# Patient Record
Sex: Female | Born: 1935 | Race: White | Hispanic: No | Smoking: Former smoker
Health system: Southern US, Community
[De-identification: ages and names within clinical notes are randomized; demographics above are authoritative.]

## PROBLEM LIST (undated history)

## (undated) DIAGNOSIS — F32A Depression, unspecified: Secondary | ICD-10-CM

## (undated) DIAGNOSIS — M8580 Other specified disorders of bone density and structure, unspecified site: Secondary | ICD-10-CM

## (undated) DIAGNOSIS — I639 Cerebral infarction, unspecified: Secondary | ICD-10-CM

## (undated) DIAGNOSIS — I10 Essential (primary) hypertension: Secondary | ICD-10-CM

## (undated) DIAGNOSIS — E785 Hyperlipidemia, unspecified: Secondary | ICD-10-CM

## (undated) DIAGNOSIS — I4901 Ventricular fibrillation: Secondary | ICD-10-CM

## (undated) DIAGNOSIS — M4800 Spinal stenosis, site unspecified: Secondary | ICD-10-CM

## (undated) DIAGNOSIS — I255 Ischemic cardiomyopathy: Secondary | ICD-10-CM

## (undated) DIAGNOSIS — R4701 Aphasia: Secondary | ICD-10-CM

## (undated) DIAGNOSIS — G629 Polyneuropathy, unspecified: Secondary | ICD-10-CM

## (undated) DIAGNOSIS — E7800 Pure hypercholesterolemia, unspecified: Secondary | ICD-10-CM

## (undated) DIAGNOSIS — Z95810 Presence of automatic (implantable) cardiac defibrillator: Secondary | ICD-10-CM

## (undated) DIAGNOSIS — L0390 Cellulitis, unspecified: Secondary | ICD-10-CM

## (undated) DIAGNOSIS — F329 Major depressive disorder, single episode, unspecified: Secondary | ICD-10-CM

## (undated) DIAGNOSIS — E559 Vitamin D deficiency, unspecified: Secondary | ICD-10-CM

## (undated) DIAGNOSIS — I219 Acute myocardial infarction, unspecified: Secondary | ICD-10-CM

## (undated) DIAGNOSIS — R0600 Dyspnea, unspecified: Secondary | ICD-10-CM

## (undated) DIAGNOSIS — I509 Heart failure, unspecified: Secondary | ICD-10-CM

## (undated) DIAGNOSIS — N289 Disorder of kidney and ureter, unspecified: Secondary | ICD-10-CM

## (undated) DIAGNOSIS — J449 Chronic obstructive pulmonary disease, unspecified: Secondary | ICD-10-CM

## (undated) DIAGNOSIS — E039 Hypothyroidism, unspecified: Secondary | ICD-10-CM

## (undated) DIAGNOSIS — R0609 Other forms of dyspnea: Secondary | ICD-10-CM

## (undated) DIAGNOSIS — I2510 Atherosclerotic heart disease of native coronary artery without angina pectoris: Secondary | ICD-10-CM

## (undated) DIAGNOSIS — E538 Deficiency of other specified B group vitamins: Secondary | ICD-10-CM

## (undated) HISTORY — DX: Depression, unspecified: F32.A

## (undated) HISTORY — DX: Hyperlipidemia, unspecified: E78.5

## (undated) HISTORY — DX: Hypothyroidism, unspecified: E03.9

## (undated) HISTORY — PX: CARDIAC DEFIBRILLATOR PLACEMENT: SHX171

## (undated) HISTORY — PX: JOINT REPLACEMENT: SHX530

## (undated) HISTORY — DX: Spinal stenosis, site unspecified: M48.00

## (undated) HISTORY — DX: Chronic obstructive pulmonary disease, unspecified: J44.9

## (undated) HISTORY — PX: REPLACEMENT TOTAL KNEE: SUR1224

## (undated) HISTORY — DX: Other specified disorders of bone density and structure, unspecified site: M85.80

## (undated) HISTORY — DX: Ischemic cardiomyopathy: I25.5

## (undated) HISTORY — DX: Major depressive disorder, single episode, unspecified: F32.9

## (undated) HISTORY — DX: Essential (primary) hypertension: I10

## (undated) HISTORY — DX: Deficiency of other specified B group vitamins: E53.8

## (undated) HISTORY — DX: Atherosclerotic heart disease of native coronary artery without angina pectoris: I25.10

## (undated) HISTORY — DX: Presence of automatic (implantable) cardiac defibrillator: Z95.810

---
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
## 1969-07-15 HISTORY — PX: ABDOMINAL HYSTERECTOMY: SHX81

## 2004-11-05 ENCOUNTER — Inpatient Hospital Stay (HOSPITAL_COMMUNITY): Admission: RE | Admit: 2004-11-05 | Discharge: 2004-11-08 | Payer: Self-pay | Admitting: Orthopedic Surgery

## 2004-11-13 ENCOUNTER — Ambulatory Visit (HOSPITAL_COMMUNITY): Admission: RE | Admit: 2004-11-13 | Discharge: 2004-11-13 | Payer: Self-pay | Admitting: Orthopedic Surgery

## 2004-11-27 ENCOUNTER — Emergency Department (HOSPITAL_COMMUNITY): Admission: EM | Admit: 2004-11-27 | Discharge: 2004-11-27 | Payer: Self-pay | Admitting: Emergency Medicine

## 2004-11-27 ENCOUNTER — Inpatient Hospital Stay (HOSPITAL_COMMUNITY): Admission: AD | Admit: 2004-11-27 | Discharge: 2004-12-03 | Payer: Self-pay | Admitting: Cardiology

## 2004-11-27 ENCOUNTER — Ambulatory Visit: Payer: Self-pay | Admitting: Critical Care Medicine

## 2004-11-29 ENCOUNTER — Encounter: Payer: Self-pay | Admitting: Cardiology

## 2004-11-29 ENCOUNTER — Ambulatory Visit: Payer: Self-pay | Admitting: Cardiology

## 2004-12-12 ENCOUNTER — Encounter: Payer: Self-pay | Admitting: Cardiology

## 2004-12-19 ENCOUNTER — Ambulatory Visit: Payer: Self-pay | Admitting: Cardiology

## 2004-12-24 ENCOUNTER — Ambulatory Visit: Payer: Self-pay | Admitting: Internal Medicine

## 2005-01-10 ENCOUNTER — Ambulatory Visit: Payer: Self-pay | Admitting: Cardiology

## 2005-01-18 ENCOUNTER — Ambulatory Visit (HOSPITAL_COMMUNITY): Admission: RE | Admit: 2005-01-18 | Discharge: 2005-01-19 | Payer: Self-pay | Admitting: Cardiology

## 2005-01-19 ENCOUNTER — Ambulatory Visit: Payer: Self-pay | Admitting: Cardiology

## 2005-02-06 ENCOUNTER — Ambulatory Visit: Payer: Self-pay | Admitting: Cardiology

## 2005-02-12 ENCOUNTER — Ambulatory Visit: Payer: Self-pay | Admitting: Internal Medicine

## 2005-02-26 ENCOUNTER — Ambulatory Visit: Payer: Self-pay | Admitting: Internal Medicine

## 2005-02-26 ENCOUNTER — Inpatient Hospital Stay (HOSPITAL_COMMUNITY): Admission: RE | Admit: 2005-02-26 | Discharge: 2005-02-27 | Payer: Self-pay | Admitting: Internal Medicine

## 2005-03-11 ENCOUNTER — Ambulatory Visit: Payer: Self-pay

## 2005-03-13 ENCOUNTER — Ambulatory Visit: Payer: Self-pay | Admitting: Cardiology

## 2005-06-18 ENCOUNTER — Ambulatory Visit: Payer: Self-pay | Admitting: Cardiology

## 2005-06-21 ENCOUNTER — Ambulatory Visit: Payer: Self-pay | Admitting: Cardiology

## 2005-07-02 ENCOUNTER — Ambulatory Visit: Payer: Self-pay | Admitting: Internal Medicine

## 2005-10-01 ENCOUNTER — Ambulatory Visit: Payer: Self-pay | Admitting: Internal Medicine

## 2005-11-03 IMAGING — CR DG CHEST 1V PORT
1 series · 1 of 1 positions shown · non-contrast
Comparison: 10/30/04.

CLINICAL DATA: Cardiac arrest. Chest pain.
 PORTABLE B7213-D VIEW:

[view not recorded]
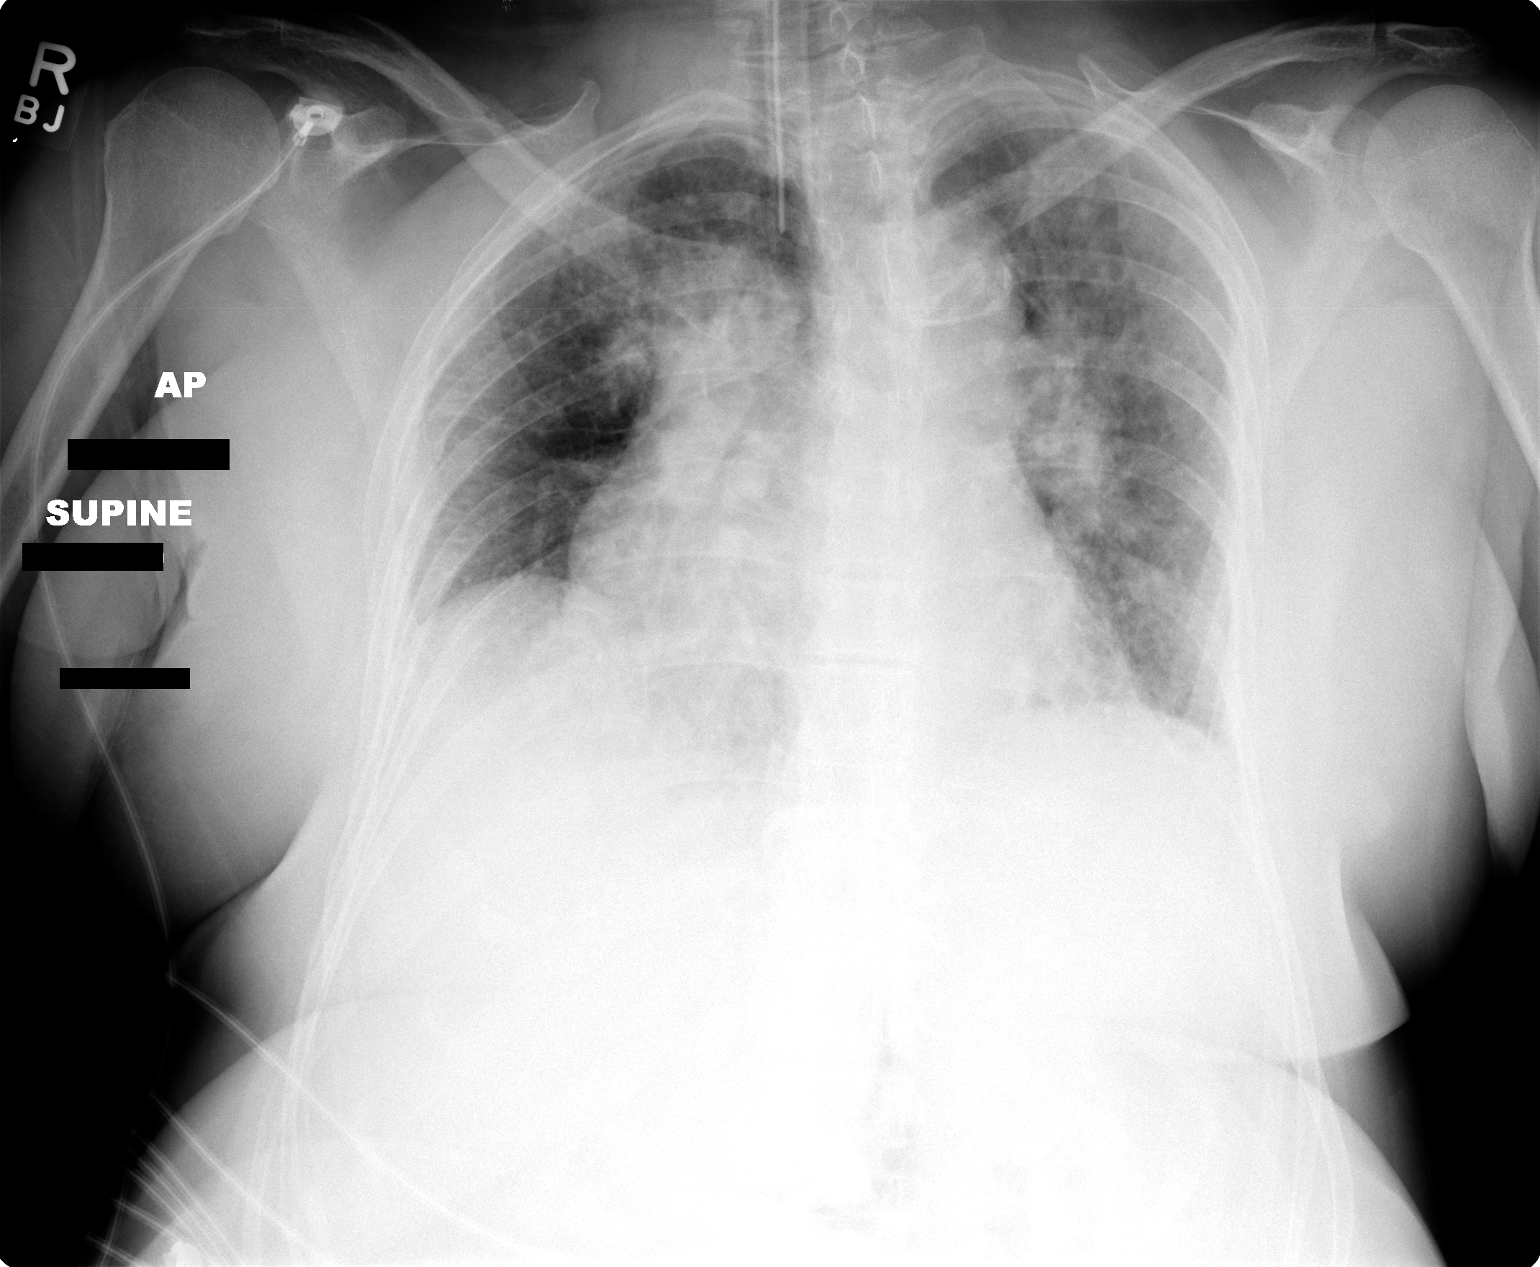

[1 of 1 positions shown; findings below may reference images not displayed]

Endotracheal tube appears in good position at the thoracic inlet.  There is poor aeration bilaterally with some mild bilateral edema.  There is extensive calcification in the thoracic aorta.
IMPRESSION: Mild pulmonary edema.  Endotracheal tube appears in good position.

## 2005-11-04 IMAGING — CR DG CHEST 1V PORT
1 series · 1 of 1 positions shown · non-contrast
Comparison: Yesterday?s exam.

CLINICAL DATA: MI.  Follow-up ventilator patient.
 PORTABLE CHEST ? 1 VIEW:

[view not recorded]
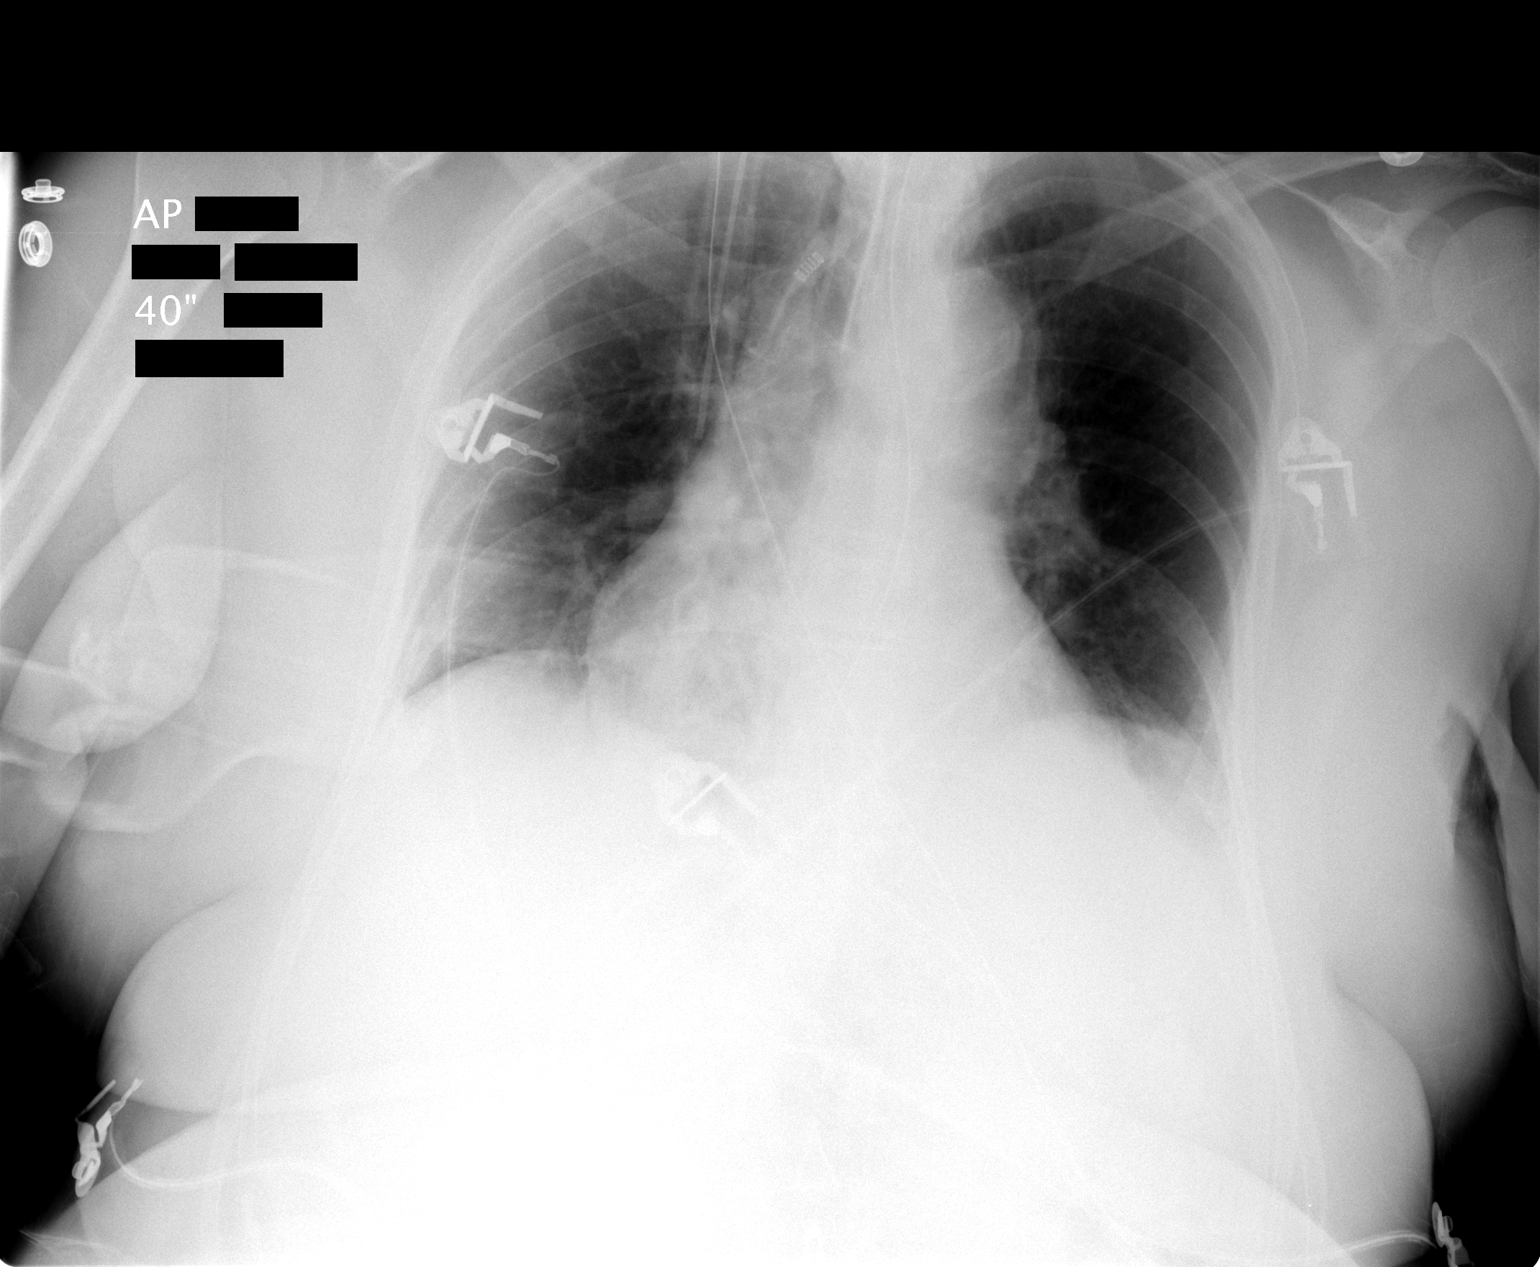

[1 of 1 positions shown; findings below may reference images not displayed]

ETT is in the lower trachea approximately 2 cm above the carina.  NG tube tip is in the mid stomach.  Lungs are well expanded and clear except for minimal atelectasis at the bases.  No vascular congestion.
IMPRESSION: Stable chest x-ray.  See comments above.

## 2006-01-20 ENCOUNTER — Ambulatory Visit: Payer: Self-pay

## 2006-04-17 ENCOUNTER — Ambulatory Visit: Payer: Self-pay | Admitting: Internal Medicine

## 2006-05-16 ENCOUNTER — Ambulatory Visit: Payer: Self-pay

## 2006-07-22 ENCOUNTER — Ambulatory Visit: Payer: Self-pay | Admitting: Internal Medicine

## 2006-08-04 ENCOUNTER — Encounter: Payer: Self-pay | Admitting: Cardiology

## 2006-10-21 ENCOUNTER — Ambulatory Visit: Payer: Self-pay | Admitting: Internal Medicine

## 2007-01-20 ENCOUNTER — Ambulatory Visit: Payer: Self-pay | Admitting: Internal Medicine

## 2007-04-21 ENCOUNTER — Ambulatory Visit: Payer: Self-pay | Admitting: Internal Medicine

## 2007-04-23 ENCOUNTER — Ambulatory Visit: Payer: Self-pay | Admitting: Internal Medicine

## 2007-07-21 ENCOUNTER — Ambulatory Visit: Payer: Self-pay | Admitting: Internal Medicine

## 2007-10-20 ENCOUNTER — Ambulatory Visit: Payer: Self-pay | Admitting: Internal Medicine

## 2007-12-18 ENCOUNTER — Ambulatory Visit: Payer: Self-pay | Admitting: Cardiology

## 2007-12-22 ENCOUNTER — Encounter: Payer: Self-pay | Admitting: Physician Assistant

## 2007-12-25 ENCOUNTER — Ambulatory Visit: Payer: Self-pay | Admitting: Cardiology

## 2008-01-08 ENCOUNTER — Ambulatory Visit: Payer: Self-pay | Admitting: Cardiology

## 2008-01-13 ENCOUNTER — Ambulatory Visit: Payer: Self-pay | Admitting: Internal Medicine

## 2008-04-11 ENCOUNTER — Ambulatory Visit: Payer: Self-pay | Admitting: Internal Medicine

## 2008-07-12 ENCOUNTER — Ambulatory Visit: Payer: Self-pay | Admitting: Internal Medicine

## 2008-07-21 ENCOUNTER — Ambulatory Visit: Payer: Self-pay | Admitting: Cardiology

## 2008-07-22 ENCOUNTER — Encounter: Payer: Self-pay | Admitting: Cardiology

## 2008-09-23 ENCOUNTER — Encounter: Payer: Self-pay | Admitting: Internal Medicine

## 2008-10-11 ENCOUNTER — Ambulatory Visit: Payer: Self-pay | Admitting: Internal Medicine

## 2008-11-30 ENCOUNTER — Encounter: Payer: Self-pay | Admitting: Cardiology

## 2009-02-01 DIAGNOSIS — I2589 Other forms of chronic ischemic heart disease: Secondary | ICD-10-CM | POA: Insufficient documentation

## 2009-02-01 DIAGNOSIS — Z95810 Presence of automatic (implantable) cardiac defibrillator: Secondary | ICD-10-CM | POA: Insufficient documentation

## 2009-02-01 DIAGNOSIS — I10 Essential (primary) hypertension: Secondary | ICD-10-CM | POA: Insufficient documentation

## 2009-02-01 DIAGNOSIS — I5022 Chronic systolic (congestive) heart failure: Secondary | ICD-10-CM | POA: Insufficient documentation

## 2009-02-27 ENCOUNTER — Ambulatory Visit: Payer: Self-pay | Admitting: Cardiology

## 2009-02-27 ENCOUNTER — Encounter: Payer: Self-pay | Admitting: Cardiology

## 2009-02-27 DIAGNOSIS — J449 Chronic obstructive pulmonary disease, unspecified: Secondary | ICD-10-CM | POA: Insufficient documentation

## 2009-02-27 DIAGNOSIS — I25810 Atherosclerosis of coronary artery bypass graft(s) without angina pectoris: Secondary | ICD-10-CM | POA: Insufficient documentation

## 2009-02-27 DIAGNOSIS — J4489 Other specified chronic obstructive pulmonary disease: Secondary | ICD-10-CM | POA: Insufficient documentation

## 2009-02-27 DIAGNOSIS — M4800 Spinal stenosis, site unspecified: Secondary | ICD-10-CM | POA: Insufficient documentation

## 2009-02-27 DIAGNOSIS — I868 Varicose veins of other specified sites: Secondary | ICD-10-CM | POA: Insufficient documentation

## 2009-02-27 DIAGNOSIS — I4901 Ventricular fibrillation: Secondary | ICD-10-CM | POA: Insufficient documentation

## 2009-03-15 ENCOUNTER — Ambulatory Visit: Payer: Self-pay | Admitting: Internal Medicine

## 2009-06-29 ENCOUNTER — Encounter: Payer: Self-pay | Admitting: Internal Medicine

## 2009-07-05 ENCOUNTER — Telehealth: Payer: Self-pay | Admitting: Internal Medicine

## 2009-07-05 ENCOUNTER — Encounter: Payer: Self-pay | Admitting: Internal Medicine

## 2009-07-05 ENCOUNTER — Ambulatory Visit: Payer: Self-pay | Admitting: Internal Medicine

## 2009-07-18 ENCOUNTER — Encounter: Payer: Self-pay | Admitting: Internal Medicine

## 2009-08-21 ENCOUNTER — Encounter: Payer: Self-pay | Admitting: Cardiology

## 2009-09-19 ENCOUNTER — Ambulatory Visit: Payer: Self-pay | Admitting: Cardiology

## 2009-10-03 ENCOUNTER — Ambulatory Visit: Payer: Self-pay | Admitting: Internal Medicine

## 2009-10-16 ENCOUNTER — Encounter: Payer: Self-pay | Admitting: Cardiology

## 2009-10-18 ENCOUNTER — Encounter: Payer: Self-pay | Admitting: Internal Medicine

## 2009-10-26 ENCOUNTER — Encounter: Payer: Self-pay | Admitting: Cardiology

## 2009-11-07 ENCOUNTER — Inpatient Hospital Stay (HOSPITAL_COMMUNITY): Admission: RE | Admit: 2009-11-07 | Discharge: 2009-11-09 | Payer: Self-pay | Admitting: Orthopedic Surgery

## 2009-11-13 ENCOUNTER — Encounter (INDEPENDENT_AMBULATORY_CARE_PROVIDER_SITE_OTHER): Payer: Self-pay | Admitting: Orthopedic Surgery

## 2009-11-13 ENCOUNTER — Ambulatory Visit: Payer: Self-pay | Admitting: Vascular Surgery

## 2009-11-13 ENCOUNTER — Ambulatory Visit: Admission: RE | Admit: 2009-11-13 | Discharge: 2009-11-13 | Payer: Self-pay | Admitting: Orthopedic Surgery

## 2009-12-05 ENCOUNTER — Encounter (HOSPITAL_COMMUNITY): Admission: RE | Admit: 2009-12-05 | Discharge: 2010-01-04 | Payer: Self-pay | Admitting: Orthopedic Surgery

## 2010-01-09 ENCOUNTER — Ambulatory Visit: Payer: Self-pay | Admitting: Internal Medicine

## 2010-01-23 ENCOUNTER — Encounter: Payer: Self-pay | Admitting: Internal Medicine

## 2010-01-24 ENCOUNTER — Encounter: Payer: Self-pay | Admitting: Cardiology

## 2010-01-29 ENCOUNTER — Encounter: Payer: Self-pay | Admitting: Cardiology

## 2010-03-21 ENCOUNTER — Ambulatory Visit: Payer: Self-pay | Admitting: Internal Medicine

## 2010-04-12 ENCOUNTER — Ambulatory Visit: Payer: Self-pay | Admitting: Cardiology

## 2010-06-21 ENCOUNTER — Ambulatory Visit: Payer: Self-pay | Admitting: Internal Medicine

## 2010-06-27 ENCOUNTER — Encounter: Payer: Self-pay | Admitting: Internal Medicine

## 2010-07-02 ENCOUNTER — Encounter: Payer: Self-pay | Admitting: Internal Medicine

## 2010-07-24 ENCOUNTER — Encounter (INDEPENDENT_AMBULATORY_CARE_PROVIDER_SITE_OTHER): Payer: Self-pay | Admitting: *Deleted

## 2010-08-14 NOTE — Letter (Signed)
Summary: Remote Device Check  [facility], Main Office  [address]   [place], [place] [address]   Phone: [phone]  Fax: [phone]     October 18, 2009 MRN: [id number]   [name] [address] [place], [place]  [address]   Dear [name],   Your remote transmission was recieved and reviewed by your physician.  All diagnostics were within normal limits for you.  ___X__Your next transmission is scheduled for:    January 09, 2010.  Please transmit at any time this day.  If you have a wireless device your transmission will be sent automatically.     Sincerely,  Proofreader

## 2010-08-14 NOTE — Assessment & Plan Note (Signed)
Summary: PC2  Medications Added LEVOTHROID 88 MCG TABS (LEVOTHYROXINE SODIUM) take 1 tab daily SERTRALINE HCL 50 MG TABS (SERTRALINE HCL) take 1 tab daily      Allergies Added:   Visit Type:  Follow-up Primary Provider:  Dr. Reuel Boom  CC:  no cardiology complaints.  History of Present Illness: [name] returns today for followup.  She is a pleasant 77 yo woman with a DCM, s/p VF arrest, HTN, and arthritis.  She recently had knee replacement and has also had problems with HA.  No recurrent syncope, c/p,sob or ICD shocks.  Current Medications (verified): 1)  Levothroid 88 Mcg Tabs (Levothyroxine Sodium) .... Take 1 Tab Daily 2)  Digoxin 0.125 Mg Tabs (Digoxin) .... Take One Tablet By Mouth Daily 3)  Inspra 25 Mg Tabs (Eplerenone) .... Once Daily 4)  Aspirin 81 Mg Tbec (Aspirin) .... Take One Tablet By Mouth Daily 5)  Sertraline Hcl 50 Mg Tabs (Sertraline Hcl) .... Take 1 Tab Daily 6)  Metoprolol Succinate 100 Mg Xr24h-Tab (Metoprolol Succinate) .... Take One Tablet By Mouth Daily 7)  Lisinopril 40 Mg Tabs (Lisinopril) .... Take One Tablet By Mouth Daily 8)  Calcium Carbonate-Vitamin D 600-400 Mg-Unit  Tabs (Calcium Carbonate-Vitamin D) .... Two Times A Day 9)  Fish Oil   Oil (Fish Oil) .... 1000mg  At Bedtime 10)  Clonidine Hcl 0.1 Mg Tabs (Clonidine Hcl) .... Take One Tablet By Mouth Once A Day 11)  Plavix 75 Mg Tabs (Clopidogrel Bisulfate) .... Take One Tablet By Mouth Daily 12)  Lipitor 40 Mg Tabs (Atorvastatin Calcium) .... Take One Tablet By Mouth Daily. 13)  Premarin 0.45 Mg Tabs (Estrogens Conjugated) .... At Bedtime 14)  Chlorthalidone 25 Mg Tabs (Chlorthalidone) .... Take 1 Tablet By Mouth Once A Day  Allergies (verified): 1)  ! Betadine 2)  ! Cardura 3)  ! Oxycodone Hcl 4)  ! Neomycin 5)  ! * Adhesive Tape 6)  ! Nsaids 7)  ! Prednisone 8)  ! Cephalexin  Past History:  Past Medical History: Last updated: 02/01/2009 HYPERTENSION, UNSPECIFIED  (ICD-401.9) SYSTOLIC HEART FAILURE, CHRONIC (ICD-428.22) ICD - IN SITU (ICD-V45.02) CARDIOMYOPATHY, ISCHEMIC (ICD-414.8) COPD  Past Surgical History: Last updated: 02/01/2009 Abdominal Hysterectomy-Total - '71 Appendectomy - '55 Tonsillectomy - '53 D&C - '65  Review of Systems  The patient denies chest pain, syncope, dyspnea on exertion, and peripheral edema.    Vital Signs:  Patient profile:   77 year old female Weight:      142 pounds BMI:     26.07 Pulse rate:   63 / minute BP sitting:   134 / 75  (right arm)  Vitals Entered By: Dreama Saa, CNA (March 21, 2010 3:20 PM)  Physical Exam  General:  Well developed, well nourished, in no acute distress. Head:  normocephalic and atraumatic Eyes:  PERRLA/EOM intact; conjunctiva and lids normal. Mouth:  Teeth, gums and palate normal. Oral mucosa normal. Neck:  Neck supple, no JVD. No masses, thyromegaly or abnormal cervical nodes. Chest Wall:  no deformities or breast masses noted.  Status post ICD implant well seated Lungs:  Clear bilaterally to auscultation with no wheezes, rales or rhonchi. Heart:  RRR with normal S1 and S2.  PMI is enlarged and laterally displaced.  No murmurs, rubs, or gallops. Abdomen:  Bowel sounds positive; abdomen soft and non-tender without masses, organomegaly, or hernias noted. No hepatosplenomegaly. Msk:  Back normal, normal gait. Muscle strength and tone normal. Pulses:  pulses normal in all 4 extremities Extremities:  No clubbing or cyanosis. Neurologic:  Alert and oriented x 3.    ICD Specifications Following MD:  Lewayne Bunting, MD     ICD Vendor:  Medtronic     ICD Model Number:  7232     ICD Serial Number:  [id number] ICD DOI:  02/26/2005     ICD Implanting MD:  Lewayne Bunting, MD  Lead 1:    Location: RV     DOI: 02/26/2005     Model #: [id number]     Serial #: [id number]     Status: active  Indications::  ICM   ICD Follow Up Remote Check?  No Battery Voltage:  3.04 V     Charge  Time:  8.95 seconds     Underlying rhythm:  SR ICD Dependent:  No       ICD Device Measurements Right Ventricle:  Amplitude: 10.3 mV, Impedance: 464 ohms, Threshold: 1.0 V at 0.2 msec Shock Impedance: 51/64 ohms   Episodes Coumadin:  No Shock:  0     ATP:  0     Nonsustained:  0     Ventricular Pacing:  <0.1%  Brady Parameters Mode VVI     Lower Rate Limit:  40      Tachy Zones VF:  200     VT:  250     VT1:  176     Next Remote Date:  06/21/2010     Next Cardiology Appt Due:  03/16/2011 Tech Comments:  No parameter changes.  Device function normal.  6949 lead stable, SIC  0.  Updated letter and magnet given to the patient.  [facility] transmissions every 3 months.  ROV 1 year with Dr. Ladona Ridgel in [place]. Altha Harm, LPN  March 21, 2010 3:33 PM  MD Comments:  Agree with above.  Impression & Recommendations:  Problem # 1:  VENTRICULAR FIBRILLATION (ICD-427.41) she has had no recurrent symptoms or ICD shocks or syncope.  Will continue meds as noted below. Her updated medication list for this problem includes:    Aspirin 81 Mg Tbec (Aspirin) .Marland Kitchen... Take one tablet by mouth daily    Metoprolol Succinate 100 Mg Xr24h-tab (Metoprolol succinate) .Marland Kitchen... Take one tablet by mouth daily    Lisinopril 40 Mg Tabs (Lisinopril) .Marland Kitchen... Take one tablet by mouth daily    Plavix 75 Mg Tabs (Clopidogrel bisulfate) .Marland Kitchen... Take one tablet by mouth daily  Problem # 2:  HYPERTENSION, UNSPECIFIED (ICD-401.9) Her blood pressure is well controlled.  Continue meds as below. I have recommended a low sodium diet. Her updated medication list for this problem includes:    Inspra 25 Mg Tabs (Eplerenone) ..... Once daily    Aspirin 81 Mg Tbec (Aspirin) .Marland Kitchen... Take one tablet by mouth daily    Metoprolol Succinate 100 Mg Xr24h-tab (Metoprolol succinate) .Marland Kitchen... Take one tablet by mouth daily    Lisinopril 40 Mg Tabs (Lisinopril) .Marland Kitchen... Take one tablet by mouth daily    Clonidine Hcl 0.1 Mg Tabs (Clonidine hcl) .Marland Kitchen... Take  one tablet by mouth once a day    Chlorthalidone 25 Mg Tabs (Chlorthalidone) .Marland Kitchen... Take 1 tablet by mouth once a day  Patient Instructions: 1)  Your physician recommends that you schedule a follow-up appointment in: 1 year

## 2010-08-14 NOTE — Letter (Signed)
Summary: Remote Device Check  [facility], Main Office  [address]   [place], [place] [address]   Phone: [phone]  Fax: [phone]     July 18, 2009 MRN: [id number]   TANAI BOULER [address] [place], [place]  [address]   Dear Ms. Leblond,   Your remote transmission was recieved and reviewed by your physician.  All diagnostics were within normal limits for you.  __X___Your next transmission is scheduled for:   October 03, 2009.  Please transmit at any time this day.  If you have a wireless device your transmission will be sent automatically.     Sincerely,  Proofreader

## 2010-08-14 NOTE — Cardiovascular Report (Signed)
Summary: Office Visit Remote   Office Visit Remote   Imported By: Roderic Ovens 10/20/2009 14:52:23  _____________________________________________________________________  External Attachment:    Type:   Image     Comment:   External Document

## 2010-08-14 NOTE — Assessment & Plan Note (Signed)
Summary: 6 MO FU PER SEPT REMINDER-SRS      Allergies Added:   Visit Type:  Follow-up Primary [place]:  Dr. Reuel Boom   History of Present Illness: the patient is a 46 her old female with history of ischemic cardiomyopathy, status post anterior wall myocardial infarction. The patient status post PCI. Ejection fraction was reported at 46%. The patient has a history of sudden cardiac death secondary to ventricular fibrillation and is status post Med[employer] implantable ICD. The patient however has a revised lead is carrying a magnet with her.. Reportedly she had a recent fall and has postconcussive headaches. His father Dr. Reuel Boom. She also had total knee replacement earlier this year and has done well. The patient is compliant with her dual antiplatelet therapy. She denies any chest pain shortness of breath orthopnea or PND. Unfortunately she continues to smoke.  Preventive Screening-Counseling & Management  Alcohol-Tobacco     Smoking Status: current     Smoking Cessation Counseling: yes     Packs/Day: 1/2 PPD  Problems Prior to Update: 1)  Varicose Vein  (ICD-456.8) 2)  Cad, Artery Bypass Graft  (ICD-414.04) 3)  Ventricular Fibrillation  (ICD-427.41) 4)  COPD  (ICD-496) 5)  Spinal Stenosis  (ICD-724.00) 6)  Hypertension, Unspecified  (ICD-401.9) 7)  Systolic Heart Failure, Chronic  (ICD-428.22) 8)  Icd - in Situ  (ICD-V45.02) 9)  Cardiomyopathy, Ischemic  (ICD-414.8)  Medications Prior to Update: 1)  Levothroid 88 Mcg Tabs (Levothyroxine Sodium) .... Take 1 Tab Daily 2)  Digoxin 0.125 Mg Tabs (Digoxin) .... Take One Tablet By Mouth Daily 3)  Inspra 25 Mg Tabs (Eplerenone) .... Once Daily 4)  Aspirin 81 Mg Tbec (Aspirin) .... Take One Tablet By Mouth Daily 5)  Sertraline Hcl 50 Mg Tabs (Sertraline Hcl) .... Take 1 Tab Daily 6)  Metoprolol Succinate 100 Mg Xr24h-Tab (Metoprolol Succinate) .... Take One Tablet By Mouth Daily 7)  Lisinopril 40 Mg Tabs (Lisinopril) .... Take One Tablet  By Mouth Daily 8)  Calcium Carbonate-Vitamin D 600-400 Mg-Unit  Tabs (Calcium Carbonate-Vitamin D) .... Two Times A Day 9)  Fish Oil   Oil (Fish Oil) .... 1000mg  At Bedtime 10)  Clonidine Hcl 0.1 Mg Tabs (Clonidine Hcl) .... Take One Tablet By Mouth Once A Day 11)  Plavix 75 Mg Tabs (Clopidogrel Bisulfate) .... Take One Tablet By Mouth Daily 12)  Lipitor 40 Mg Tabs (Atorvastatin Calcium) .... Take One Tablet By Mouth Daily. 13)  Premarin 0.45 Mg Tabs (Estrogens Conjugated) .... At Bedtime 14)  Chlorthalidone 25 Mg Tabs (Chlorthalidone) .... Take 1 Tablet By Mouth Once A Day  Current Medications (verified): 1)  Levothroid 88 Mcg Tabs (Levothyroxine Sodium) .... Take 1 Tab Daily 2)  Digoxin 0.125 Mg Tabs (Digoxin) .... Take One Tablet By Mouth Daily 3)  Inspra 25 Mg Tabs (Eplerenone) .... Once Daily 4)  Aspirin 81 Mg Tbec (Aspirin) .... Take One Tablet By Mouth Daily 5)  Sertraline Hcl 50 Mg Tabs (Sertraline Hcl) .... Take 1 Tab Daily 6)  Metoprolol Succinate 100 Mg Xr24h-Tab (Metoprolol Succinate) .... Take One Tablet By Mouth Daily 7)  Lisinopril 40 Mg Tabs (Lisinopril) .... Take One Tablet By Mouth Daily 8)  Calcium Carbonate-Vitamin D 600-400 Mg-Unit  Tabs (Calcium Carbonate-Vitamin D) .... Two Times A Day 9)  Fish Oil   Oil (Fish Oil) .... 1000mg  At Bedtime 10)  Clonidine Hcl 0.1 Mg Tabs (Clonidine Hcl) .... Take One Tablet By Mouth Once A Day 11)  Plavix 75 Mg Tabs (Clopidogrel Bisulfate) .Marland KitchenMarland KitchenMarland Kitchen  Take One Tablet By Mouth Daily 12)  Lipitor 40 Mg Tabs (Atorvastatin Calcium) .... Take One Tablet By Mouth Daily. 13)  Premarin 0.45 Mg Tabs (Estrogens Conjugated) .... At Bedtime 14)  Chlorthalidone 25 Mg Tabs (Chlorthalidone) .... Take 1 Tablet By Mouth Once A Day  Allergies (verified): 1)  ! Betadine 2)  ! Cardura 3)  ! Oxycodone Hcl 4)  ! Neomycin 5)  ! * Adhesive Tape 6)  ! Nsaids 7)  ! Prednisone 8)  ! Cephalexin  Comments:  Nurse/Medical Assistant: The patient's medication list  and allergies were reviewed with the patient and were updated in the Medication and Allergy Lists.  Past History:  Past Surgical History: Last updated: 02/01/2009 Abdominal Hysterectomy-Total - '71 Appendectomy - '55 Tonsillectomy - '53 D&C - '40  Family History: Last updated: 02/01/2009 Family History of CVA or Stroke:  Family History of Hypertension:   Social History: Last updated: 02/01/2009 Retired  Widowed  Tobacco Use - Yes.  Alcohol Use - no  Risk Factors: Smoking Status: current (04/12/2010) Packs/Day: 1/2 PPD (04/12/2010)  Past Medical History: HYPERTENSION, UNSPECIFIED (ICD-401.9) SYSTOLIC HEART FAILURE, CHRONIC (ICD-428.22) ICD - IN SITU (ICD-V45.02) CARDIOMYOPATHY, ISCHEMIC (ICD-414.8) COPD Postconcussive headaches status post CT with left subinsular infarct. B12 deficiency COPD Obstructive sleep apnea with periodic limb movement off CPAP Status post dribbling stent x2 in the LAD 2006 ICD ejection fraction 50-55% Hypothyroidism Depression Hyperlipidemia Osteopenia  Review of Systems       The patient complains of fatigue and depression.  The patient denies malaise, fever, weight gain/loss, vision loss, decreased hearing, hoarseness, chest pain, palpitations, shortness of breath, prolonged cough, wheezing, sleep apnea, coughing up blood, abdominal pain, blood in stool, nausea, vomiting, diarrhea, heartburn, incontinence, blood in urine, muscle weakness, joint pain, leg swelling, rash, skin lesions, headache, fainting, dizziness, anxiety, enlarged lymph nodes, easy bruising or bleeding, and environmental allergies.    Vital Signs:  Patient profile:   77 year old female Height:      62 inches Weight:      144 pounds Pulse rate:   56 / minute BP sitting:   120 / 70  (left arm) Cuff size:   regular  Vitals Entered By: Carlye Grippe (April 12, 2010 9:36 AM)  Physical Exam  Additional Exam:  General: Well-developed, well-nourished in no  distress head: Normocephalic and atraumatic eyes PERRLA/EOMI intact, conjunctiva and lids normal nose: No deformity or lesions mouth normal dentition, normal posterior pharynx neck: Supple, no JVD.  No masses, thyromegaly or abnormal cervical nodes lungs: Normal breath sounds bilaterally without wheezing.  Normal percussion heart: regular rate and rhythm with normal S1 and S2, no S3 or S4.  PMI is normal.  No pathological murmurs abdomen: Normal bowel sounds, abdomen is soft and nontender without masses, organomegaly or hernias noted.  No hepatosplenomegaly musculoskeletal: Back normal, normal gait muscle strength and tone normal pulsus: Pulse is normal in all 4 extremities Extremities: No peripheral pitting edema neurologic: Alert and oriented x 3 skin: Intact without lesions or rashes cervical nodes: No significant adenopathy psychologic: Normal affect     ICD Specifications Following MD:  Lewayne Bunting, MD     ICD Vendor:  Medtronic     ICD Model Number:  7232     ICD Serial Number:  [id number] ICD DOI:  02/26/2005     ICD Implanting MD:  Lewayne Bunting, MD  Lead 1:    Location: RV     DOI: 02/26/2005     Model #: [id number]  Serial #: [id number]     Status: active  Indications::  ICM   ICD Follow Up ICD Dependent:  No      Episodes Coumadin:  No  Brady Parameters Mode VVI     Lower Rate Limit:  40      Tachy Zones VF:  200     VT:  250     VT1:  176     Impression & Recommendations:  Problem # 1:  SPINAL STENOSIS (ICD-724.00) the patient has symptoms of neuropathy.she is followed by Dr. Reuel Boom.  Problem # 2:  HYPERTENSION, UNSPECIFIED (ICD-401.9) blood pressure is well-controlled. No medication changes are planned. Her updated medication list for this problem includes:    Inspra 25 Mg Tabs (Eplerenone) ..... Once daily    Aspirin 81 Mg Tbec (Aspirin) .Marland Kitchen... Take one tablet by mouth daily    Metoprolol Succinate 100 Mg Xr24h-tab (Metoprolol succinate) .Marland Kitchen... Take one tablet  by mouth daily    Lisinopril 40 Mg Tabs (Lisinopril) .Marland Kitchen... Take one tablet by mouth daily    Clonidine Hcl 0.1 Mg Tabs (Clonidine hcl) .Marland Kitchen... Take one tablet by mouth once a day    Chlorthalidone 25 Mg Tabs (Chlorthalidone) .Marland Kitchen... Take 1 tablet by mouth once a day  Problem # 3:  CARDIOMYOPATHY, ISCHEMIC (ICD-414.8) the patient has ischemic cardiomyopathy but has no recurrent chest pain. She is hemodynamically well compensated. She has a history of multivessel PCI and we will continue aspirin and Plavix. Her updated medication list for this problem includes:    Digoxin 0.125 Mg Tabs (Digoxin) .Marland Kitchen... Take one tablet by mouth daily    Aspirin 81 Mg Tbec (Aspirin) .Marland Kitchen... Take one tablet by mouth daily    Metoprolol Succinate 100 Mg Xr24h-tab (Metoprolol succinate) .Marland Kitchen... Take one tablet by mouth daily    Lisinopril 40 Mg Tabs (Lisinopril) .Marland Kitchen... Take one tablet by mouth daily    Plavix 75 Mg Tabs (Clopidogrel bisulfate) .Marland Kitchen... Take one tablet by mouth daily    Chlorthalidone 25 Mg Tabs (Chlorthalidone) .Marland Kitchen... Take 1 tablet by mouth once a day  Problem # 4:  OTHER FALL (ICD-E888.8) the patient had an accidental fall. She tripped. She had no significant bleeding intracranially and has been evaluated with a CT scan. Dr. Illene Silver for postconcussive headaches.  Problem # 5:  ICD - IN SITU (ICD-V45.02) the patient carries a magnet with her. She apparently has a revised lead. She has had no discharges.  Patient Instructions: 1)  Your physician recommends that you continue on your current medications as directed. Please refer to the Current Medication list given to you today. 2)  Follow up in  6 mnths

## 2010-08-14 NOTE — Letter (Signed)
Summary: External Correspondence/ OFFICE VISIT DR. DANIEL  External Correspondence/ OFFICE VISIT DR. DANIEL   Imported By: Dorise Hiss 02/01/2010 08:30:05  _____________________________________________________________________  External Attachment:    Type:   Image     Comment:   External Document

## 2010-08-14 NOTE — Letter (Signed)
Summary: Letter/ [facility]  Letter/ [facility]ED [facility]   Imported By: Dorise Hiss 11/06/2009 10:56:48  _____________________________________________________________________  External Attachment:    Type:   Image     Comment:   External Document

## 2010-08-14 NOTE — Letter (Signed)
Summary: Letter/ [facility]  Letter/ [facility]   Imported By: Dorise Hiss 10/16/2009 12:39:43  _____________________________________________________________________  External Attachment:    Type:   Image     Comment:   External Document  Appended Document: Letter/ [facility] as discussed.

## 2010-08-14 NOTE — Letter (Signed)
Summary: Remote Device Check  [facility], Main Office  [address]   [place], [place] [address]   Phone: [phone]  Fax: [phone]     January 23, 2010 MRN: [id number]   LOIDA CALAMIA [address] [place], [place]  [address]   Dear Ms. Worton,   Your remote transmission was recieved and reviewed by your physician.  All diagnostics were within normal limits for you.   __X____Your next office visit is scheduled for: September 2011 w/Dr Ladona Ridgel. Please call our office to schedule an appointment.    Sincerely,  Vella Kohler

## 2010-08-14 NOTE — Assessment & Plan Note (Signed)
Summary: 6 month fu recv reminder vs  Medications Added CLONIDINE HCL 0.1 MG TABS (CLONIDINE HCL) Take one tablet by mouth once a day CHLORTHALIDONE 25 MG TABS (CHLORTHALIDONE) Take 1 tablet by mouth once a day      Allergies Added:   Visit Type:  Follow-up Primary Provider:  Dr. Reuel Boom  CC:  follow-up visit.  History of Present Illness: the patient is a 77 year old female with history of coronary artery disease.  Status post anterior wall myocardial infarction with stenting in July of 2006.  She had a stress test done a year and a half ago with no ischemia and an ejection fraction of 46%.  She is status-post a Medtronic implantable cardioverter defibrillator in 2006 secondary to ventricular fibrillation/sudden cardiac death.  The patient denies any chest pain.  She is no short of breath orthopnea PND.  She is due for some blood work.  She is been counseled to stop tobacco use.  She denies any orthopnea PND palpitations or syncope.  She also reports no defibrillator discharges.  the patient has a history of spinal stenosis with pseudo-claudication.  Clinical Review Panels:  CXR CXR results  Clinical Data: Post pacemaker   CHEST - 2 VIEW:   Comparison:  02/26/2005    Findings:  Left AICD has been placed. Single lead is in the right   ventricle just   beyond the tricuspid valve. Cardiomegaly. No focal air space   opacities or   pneumothorax.    IMPRESSION:   Left AICD as above. No pneumothorax. Cardiomegaly.    Read By:  Charlett Nose,  M.D. (02/27/2005)  Echocardiogram Echocardiogram 1. Left ventricular wall motion and contractility are low normal. 2. the estimated EF is 50 3.There is hypokinesis of the distal septal wall and the distal inferior wall and the apex 4. Mild aortic cusp sclerosis is present. (12/25/2007)  Carotid Studies Carotid Doppler Results Bilateral carotid atherosclerotic plaquing is described above The velocity in the internal carotid artery is in the  less than 50% stenosis range bilaterally There is elevation of the systolic velocity in the right external carotid artery the right vertebral artery appears to be occluded The left vertebral artery is patent with anegrade flow (12/25/2007)  Cardiac Imaging Cardiac Cath Findings  1.  Moderate reduction in left ventricular function with wall motion      abnormality involving the left anterior descending territory and      probable secondary mitral regurgitation.  2.  Moderate pulmonary hypertension.  3.  Continued patency of the previous left anterior descending stents with a      dense wall motion abnormality in the territory the left anterior      descending.  4.  Moderately severe stenosis of the second obtuse marginal w5hich is      unfavorable for percutaneous intervention because of its ostial and      proximal segmental location.  5.  High-grade stenosis of the third obtuse marginal with successful      overlapping non DES stents.   DISPOSITION:  The vessel was clearly too small for placement of the DES.  I  discussed the case carefully with Dr. Andee Lineman.  The decision was made to  proceed with percutaneous intervention and try continue to stabilize her  medically.  She does have at least moderate pulmonary hypertension and  mitral regurgitation.  However, the LAD territory is gone and probably  nonviable. The right coronary artery is not critically narrowed.  We will  recommend close follow-up  with Dr. Andee Lineman. Arturo Morton. Riley Kill, M.D (01/18/2005)    Preventive Screening-Counseling & Management  Alcohol-Tobacco     Smoking Status: current     Smoking Cessation Counseling: yes     Packs/Day: 1/2 PPD  Current Medications (verified): 1)  Synthroid 125 Mcg Tabs (Levothyroxine Sodium) .... Once Daily 2)  Digoxin 0.125 Mg Tabs (Digoxin) .... Take One Tablet By Mouth Daily 3)  Inspra 25 Mg Tabs (Eplerenone) .... Once Daily 4)  Aspirin 81 Mg Tbec (Aspirin) .... Take One Tablet By  Mouth Daily 5)  Sertraline Hcl 100 Mg Tabs (Sertraline Hcl) .... Once Daily 6)  Metoprolol Succinate 100 Mg Xr24h-Tab (Metoprolol Succinate) .... Take One Tablet By Mouth Daily 7)  Lisinopril 40 Mg Tabs (Lisinopril) .... Take One Tablet By Mouth Daily 8)  Calcium Carbonate-Vitamin D 600-400 Mg-Unit  Tabs (Calcium Carbonate-Vitamin D) .... Two Times A Day 9)  Fish Oil   Oil (Fish Oil) .... 1000mg  At Bedtime 10)  Clonidine Hcl 0.1 Mg Tabs (Clonidine Hcl) .... Take One Tablet By Mouth Once A Day 11)  Plavix 75 Mg Tabs (Clopidogrel Bisulfate) .... Take One Tablet By Mouth Daily 12)  Lipitor 40 Mg Tabs (Atorvastatin Calcium) .... Take One Tablet By Mouth Daily. 13)  Premarin 0.45 Mg Tabs (Estrogens Conjugated) .... At Bedtime 14)  Chlorthalidone 25 Mg Tabs (Chlorthalidone) .... Take 1 Tablet By Mouth Once A Day  Allergies (verified): 1)  ! Betadine 2)  ! Cardura 3)  ! Oxycodone Hcl 4)  ! Neomycin 5)  ! * Adhesive Tape 6)  ! Nsaids 7)  ! Prednisone 8)  ! Cephalexin  Comments:  Nurse/Medical Assistant: The patient's medications and allergies were reviewed with the patient and were updated in the Medication and Allergy Lists. List reviewed.  Past History:  Past Medical History: Last updated: 02/01/2009 HYPERTENSION, UNSPECIFIED (ICD-401.9) SYSTOLIC HEART FAILURE, CHRONIC (ICD-428.22) ICD - IN SITU (ICD-V45.02) CARDIOMYOPATHY, ISCHEMIC (ICD-414.8) COPD  Past Surgical History: Last updated: 02/01/2009 Abdominal Hysterectomy-Total - '71 Appendectomy - '55 Tonsillectomy - '53 D&C - '68  Family History: Last updated: 02/01/2009 Family History of CVA or Stroke:  Family History of Hypertension:   Social History: Last updated: 02/01/2009 Retired  Widowed  Tobacco Use - Yes.  Alcohol Use - no  Risk Factors: Smoking Status: current (09/19/2009) Packs/Day: 1/2 PPD (09/19/2009)  Social History: Packs/Day:  1/2 PPD  Review of Systems  The patient denies fatigue, malaise,  fever, weight gain/loss, vision loss, decreased hearing, hoarseness, chest pain, palpitations, shortness of breath, prolonged cough, wheezing, sleep apnea, coughing up blood, abdominal pain, blood in stool, nausea, vomiting, diarrhea, heartburn, incontinence, blood in urine, muscle weakness, joint pain, leg swelling, rash, skin lesions, headache, fainting, dizziness, depression, anxiety, enlarged lymph nodes, easy bruising or bleeding, and environmental allergies.    Vital Signs:  Patient profile:   77 year old female Height:      62 inches Weight:      156 pounds Pulse rate:   57 / minute BP sitting:   120 / 77  (left arm) Cuff size:   regular  Vitals Entered By: Carlye Grippe (September 19, 2009 11:20 AM) CC: follow-up visit   Physical Exam  Additional Exam:  General: Well-developed, well-nourished in no distress head: Normocephalic and atraumatic eyes PERRLA/EOMI intact, conjunctiva and lids normal nose: No deformity or lesions mouth normal dentition, normal posterior pharynx neck: Supple, no JVD.  No masses, thyromegaly or abnormal cervical nodes lungs: Normal breath sounds bilaterally without wheezing.  Normal  percussion heart: regular rate and rhythm with normal S1 and S2, no S3 or S4.  PMI is normal.  No pathological murmurs abdomen: Normal bowel sounds, abdomen is soft and nontender without masses, organomegaly or hernias noted.  No hepatosplenomegaly musculoskeletal: Back normal, normal gait muscle strength and tone normal pulsus: Pulse is normal in all 4 extremities Extremities: No peripheral pitting edema neurologic: Alert and oriented x 3 skin: Intact without lesions or rashes cervical nodes: No significant adenopathy psychologic: Normal affect     ICD Specifications Following MD:  Lewayne Bunting, MD     ICD Vendor:  Medtronic     ICD Model Number:  7232     ICD Serial Number:  [id number] ICD DOI:  02/26/2005     ICD Implanting MD:  Lewayne Bunting, MD  Lead 1:     Location: RV     DOI: 02/26/2005     Model #: [id number]     Serial #: [id number]     Status: active  Indications::  ICM   Episodes Coumadin:  No  Brady Parameters Mode VVI     Lower Rate Limit:  40      Tachy Zones VF:  200     VT:  250     VT1:  176     Impression & Recommendations:  Problem # 1:  CAD, ARTERY BYPASS GRAFT (ICD-414.04) no recurrent chest pain.  No indication for stress testing currently. Her updated medication list for this problem includes:    Aspirin 81 Mg Tbec (Aspirin) .Marland Kitchen... Take one tablet by mouth daily    Metoprolol Succinate 100 Mg Xr24h-tab (Metoprolol succinate) .Marland Kitchen... Take one tablet by mouth daily    Lisinopril 40 Mg Tabs (Lisinopril) .Marland Kitchen... Take one tablet by mouth daily    Plavix 75 Mg Tabs (Clopidogrel bisulfate) .Marland Kitchen... Take one tablet by mouth daily  Problem # 2:  VENTRICULAR FIBRILLATION (ICD-427.41) the patient is followed by Dr. Ladona Ridgel.  Reportedly there were no defibrillator discharges.  Her ejection fraction is stable around 45 to 50% Her updated medication list for this problem includes:    Aspirin 81 Mg Tbec (Aspirin) .Marland Kitchen... Take one tablet by mouth daily    Metoprolol Succinate 100 Mg Xr24h-tab (Metoprolol succinate) .Marland Kitchen... Take one tablet by mouth daily    Lisinopril 40 Mg Tabs (Lisinopril) .Marland Kitchen... Take one tablet by mouth daily    Plavix 75 Mg Tabs (Clopidogrel bisulfate) .Marland Kitchen... Take one tablet by mouth daily  Problem # 3:  HYPERTENSION, UNSPECIFIED (ICD-401.9) blood pressure is under better control with current medical regimen.  Hydrochlorothiazide was changed to chlorthalidone. The following medications were removed from the medication list:    Hydrochlorothiazide 25 Mg Tabs (Hydrochlorothiazide) .Marland Kitchen... Take one tablet by mouth daily. Her updated medication list for this problem includes:    Inspra 25 Mg Tabs (Eplerenone) ..... Once daily    Aspirin 81 Mg Tbec (Aspirin) .Marland Kitchen... Take one tablet by mouth daily    Metoprolol Succinate 100 Mg Xr24h-tab  (Metoprolol succinate) .Marland Kitchen... Take one tablet by mouth daily    Lisinopril 40 Mg Tabs (Lisinopril) .Marland Kitchen... Take one tablet by mouth daily    Clonidine Hcl 0.1 Mg Tabs (Clonidine hcl) .Marland Kitchen... Take one tablet by mouth once a day    Chlorthalidone 25 Mg Tabs (Chlorthalidone) .Marland Kitchen... Take 1 tablet by mouth once a day  Problem # 4:  SPINAL STENOSIS (ICD-724.00) Assessment: Comment Only  Problem # 5:  ICD - IN SITU (ICD-V45.02) Assessment: Comment Only EKG chemistries normal  sinus rhythm.  The patient reports no defibrillator discharges. Orders: EKG w/ Interpretation (93000)  Patient Instructions: 1)  Your physician recommends that you continue on your current medications as directed. Please refer to the Current Medication list given to you today. 2)  Follow up in  6 months.

## 2010-08-14 NOTE — Cardiovascular Report (Signed)
Summary: Office Visit Remote   Office Visit Remote   Imported By: Roderic Ovens 01/24/2010 12:10:22  _____________________________________________________________________  External Attachment:    Type:   Image     Comment:   External Document

## 2010-08-14 NOTE — Cardiovascular Report (Signed)
Summary: Office Visit Remote   Office Visit Remote   Imported By: Roderic Ovens 07/20/2009 12:47:39  _____________________________________________________________________  External Attachment:    Type:   Image     Comment:   External Document

## 2010-08-16 NOTE — Cardiovascular Report (Signed)
Summary: Office Visit Remote   Office Visit Remote   Imported By: Roderic Ovens 07/27/2010 10:53:38  _____________________________________________________________________  External Attachment:    Type:   Image     Comment:   External Document

## 2010-08-16 NOTE — Letter (Signed)
Summary: Remote Device Check  [employer], Main Office  [address]   [place], [place] [address]   Phone: [phone]  Fax: [phone]     July 24, 2010 MRN: [id number]   [facility] [address] [place], [place]  [address]   Dear Ms. Sindt,   Your remote transmission was recieved and reviewed by your physician.  All diagnostics were within normal limits for you.  __X___Your next transmission is scheduled for:  10-04-2010.  Please transmit at any time this day.  If you have a wireless device your transmission will be sent automatically.   Sincerely,  Vella Kohler

## 2010-08-16 NOTE — Letter (Signed)
Summary: Device-Delinquent Phone Journalist, newspaper, Main Office  [address]   [place], [place] [address]   Phone: [phone]  Fax: [phone]     June 27, 2010 MRN: [id number]   [name] [address] [place], [place]  [address]   Dear Ms. Murillo,  According to our records, you were scheduled for a device phone transmission on 06-21-2010.     We did not receive any results from this check.  If you transmitted on your scheduled day, please call us to help troubleshoot your system.  If you forgot to send your transmission, please send one upon receipt of this letter.  Thank you,   Architectural technologist [facility]

## 2010-08-23 ENCOUNTER — Ambulatory Visit (HOSPITAL_COMMUNITY)
Admission: RE | Admit: 2010-08-23 | Discharge: 2010-08-23 | Disposition: A | Discharge status: Home / Self Care | Payer: Medicare Other | Source: Ambulatory Visit | Attending: Orthopedic Surgery | Admitting: Orthopedic Surgery

## 2010-08-23 DIAGNOSIS — M545 Low back pain, unspecified: Secondary | ICD-10-CM | POA: Insufficient documentation

## 2010-08-23 DIAGNOSIS — IMO0001 Reserved for inherently not codable concepts without codable children: Secondary | ICD-10-CM | POA: Insufficient documentation

## 2010-08-23 DIAGNOSIS — M6281 Muscle weakness (generalized): Secondary | ICD-10-CM | POA: Insufficient documentation

## 2010-08-28 ENCOUNTER — Ambulatory Visit (HOSPITAL_COMMUNITY)
Admission: RE | Admit: 2010-08-28 | Discharge: 2010-08-28 | Disposition: A | Discharge status: Home / Self Care | Payer: Medicare Other | Source: Ambulatory Visit | Attending: Family Medicine | Admitting: Family Medicine

## 2010-08-28 DIAGNOSIS — M545 Low back pain, unspecified: Secondary | ICD-10-CM | POA: Insufficient documentation

## 2010-08-28 DIAGNOSIS — IMO0001 Reserved for inherently not codable concepts without codable children: Secondary | ICD-10-CM | POA: Insufficient documentation

## 2010-08-28 DIAGNOSIS — M6281 Muscle weakness (generalized): Secondary | ICD-10-CM | POA: Insufficient documentation

## 2010-08-30 ENCOUNTER — Ambulatory Visit (HOSPITAL_COMMUNITY)
Admission: RE | Admit: 2010-08-30 | Discharge: 2010-08-30 | Disposition: A | Discharge status: Home / Self Care | Payer: Medicare Other | Source: Ambulatory Visit | Attending: *Deleted | Admitting: *Deleted

## 2010-08-31 ENCOUNTER — Ambulatory Visit (HOSPITAL_COMMUNITY)
Admission: RE | Admit: 2010-08-31 | Discharge: 2010-08-31 | Disposition: A | Discharge status: Home / Self Care | Payer: Medicare Other | Source: Ambulatory Visit

## 2010-09-03 ENCOUNTER — Ambulatory Visit (HOSPITAL_COMMUNITY): Payer: Medicare Other | Admitting: Physical Therapy

## 2010-09-05 ENCOUNTER — Ambulatory Visit (HOSPITAL_COMMUNITY)
Admission: RE | Admit: 2010-09-05 | Discharge: 2010-09-05 | Disposition: A | Discharge status: Home / Self Care | Payer: Medicare Other | Source: Ambulatory Visit | Attending: *Deleted | Admitting: *Deleted

## 2010-09-07 ENCOUNTER — Ambulatory Visit (HOSPITAL_COMMUNITY)
Admission: RE | Admit: 2010-09-07 | Discharge: 2010-09-07 | Disposition: A | Discharge status: Home / Self Care | Payer: Medicare Other | Source: Ambulatory Visit | Attending: *Deleted | Admitting: *Deleted

## 2010-09-10 ENCOUNTER — Ambulatory Visit (HOSPITAL_COMMUNITY): Payer: Medicare Other | Admitting: Physical Therapy

## 2010-09-11 ENCOUNTER — Ambulatory Visit (HOSPITAL_COMMUNITY): Payer: Medicare Other

## 2010-09-12 ENCOUNTER — Ambulatory Visit (HOSPITAL_COMMUNITY)
Admission: RE | Admit: 2010-09-12 | Discharge: 2010-09-12 | Disposition: A | Discharge status: Home / Self Care | Payer: Medicare Other | Source: Ambulatory Visit | Attending: Orthopedic Surgery | Admitting: Orthopedic Surgery

## 2010-09-12 DIAGNOSIS — M6281 Muscle weakness (generalized): Secondary | ICD-10-CM | POA: Insufficient documentation

## 2010-09-12 DIAGNOSIS — M545 Low back pain, unspecified: Secondary | ICD-10-CM | POA: Insufficient documentation

## 2010-09-12 DIAGNOSIS — IMO0001 Reserved for inherently not codable concepts without codable children: Secondary | ICD-10-CM | POA: Insufficient documentation

## 2010-09-14 ENCOUNTER — Ambulatory Visit (HOSPITAL_COMMUNITY): Payer: Medicare Other

## 2010-09-17 ENCOUNTER — Ambulatory Visit (HOSPITAL_COMMUNITY)
Admission: RE | Admit: 2010-09-17 | Discharge: 2010-09-17 | Disposition: A | Discharge status: Home / Self Care | Payer: Medicare Other | Source: Ambulatory Visit | Attending: Orthopedic Surgery | Admitting: Orthopedic Surgery

## 2010-09-19 ENCOUNTER — Ambulatory Visit (HOSPITAL_COMMUNITY)
Admission: RE | Admit: 2010-09-19 | Discharge: 2010-09-19 | Disposition: A | Discharge status: Home / Self Care | Payer: Medicare Other | Source: Ambulatory Visit | Attending: Orthopedic Surgery | Admitting: Orthopedic Surgery

## 2010-09-21 ENCOUNTER — Ambulatory Visit (HOSPITAL_COMMUNITY)
Admission: RE | Admit: 2010-09-21 | Discharge: 2010-09-21 | Disposition: A | Discharge status: Home / Self Care | Payer: Medicare Other | Source: Ambulatory Visit

## 2010-09-23 ENCOUNTER — Emergency Department (HOSPITAL_COMMUNITY): Payer: Medicare Other

## 2010-09-23 ENCOUNTER — Emergency Department (HOSPITAL_COMMUNITY)
Admission: EM | Admit: 2010-09-23 | Discharge: 2010-09-23 | Disposition: A | Discharge status: Other Acute Inpatient Hospital | Payer: Medicare Other | Source: Home / Self Care | Attending: Emergency Medicine | Admitting: Emergency Medicine

## 2010-09-23 ENCOUNTER — Ambulatory Visit (HOSPITAL_COMMUNITY)
Admission: EM | Admit: 2010-09-23 | Discharge: 2010-09-23 | Disposition: A | Discharge status: Home / Self Care | Payer: Medicare Other | Source: Ambulatory Visit | Attending: Internal Medicine | Admitting: Internal Medicine

## 2010-09-23 ENCOUNTER — Inpatient Hospital Stay (HOSPITAL_COMMUNITY)
Admission: EM | Admit: 2010-09-23 | Discharge: 2010-09-28 | DRG: 247 | Disposition: A | Discharge status: Home / Self Care | Payer: Medicare Other | Source: Other Acute Inpatient Hospital | Attending: Internal Medicine | Admitting: Internal Medicine

## 2010-09-23 DIAGNOSIS — Z7982 Long term (current) use of aspirin: Secondary | ICD-10-CM

## 2010-09-23 DIAGNOSIS — I509 Heart failure, unspecified: Secondary | ICD-10-CM | POA: Diagnosis present

## 2010-09-23 DIAGNOSIS — Z888 Allergy status to other drugs, medicaments and biological substances status: Secondary | ICD-10-CM

## 2010-09-23 DIAGNOSIS — Z8674 Personal history of sudden cardiac arrest: Secondary | ICD-10-CM

## 2010-09-23 DIAGNOSIS — F3289 Other specified depressive episodes: Secondary | ICD-10-CM | POA: Diagnosis present

## 2010-09-23 DIAGNOSIS — I10 Essential (primary) hypertension: Secondary | ICD-10-CM | POA: Insufficient documentation

## 2010-09-23 DIAGNOSIS — I219 Acute myocardial infarction, unspecified: Secondary | ICD-10-CM | POA: Insufficient documentation

## 2010-09-23 DIAGNOSIS — I5022 Chronic systolic (congestive) heart failure: Secondary | ICD-10-CM | POA: Diagnosis present

## 2010-09-23 DIAGNOSIS — G4733 Obstructive sleep apnea (adult) (pediatric): Secondary | ICD-10-CM | POA: Diagnosis present

## 2010-09-23 DIAGNOSIS — F17200 Nicotine dependence, unspecified, uncomplicated: Secondary | ICD-10-CM | POA: Diagnosis present

## 2010-09-23 DIAGNOSIS — I2129 ST elevation (STEMI) myocardial infarction involving other sites: Principal | ICD-10-CM | POA: Diagnosis present

## 2010-09-23 DIAGNOSIS — Y840 Cardiac catheterization as the cause of abnormal reaction of the patient, or of later complication, without mention of misadventure at the time of the procedure: Secondary | ICD-10-CM | POA: Diagnosis present

## 2010-09-23 DIAGNOSIS — F329 Major depressive disorder, single episode, unspecified: Secondary | ICD-10-CM | POA: Diagnosis present

## 2010-09-23 DIAGNOSIS — I2510 Atherosclerotic heart disease of native coronary artery without angina pectoris: Secondary | ICD-10-CM | POA: Diagnosis present

## 2010-09-23 DIAGNOSIS — Z7902 Long term (current) use of antithrombotics/antiplatelets: Secondary | ICD-10-CM

## 2010-09-23 DIAGNOSIS — J4489 Other specified chronic obstructive pulmonary disease: Secondary | ICD-10-CM | POA: Diagnosis present

## 2010-09-23 DIAGNOSIS — Z9861 Coronary angioplasty status: Secondary | ICD-10-CM

## 2010-09-23 DIAGNOSIS — I252 Old myocardial infarction: Secondary | ICD-10-CM

## 2010-09-23 DIAGNOSIS — R079 Chest pain, unspecified: Secondary | ICD-10-CM

## 2010-09-23 DIAGNOSIS — E039 Hypothyroidism, unspecified: Secondary | ICD-10-CM | POA: Diagnosis present

## 2010-09-23 DIAGNOSIS — Z95810 Presence of automatic (implantable) cardiac defibrillator: Secondary | ICD-10-CM

## 2010-09-23 DIAGNOSIS — J449 Chronic obstructive pulmonary disease, unspecified: Secondary | ICD-10-CM | POA: Diagnosis present

## 2010-09-23 DIAGNOSIS — I2589 Other forms of chronic ischemic heart disease: Secondary | ICD-10-CM | POA: Diagnosis present

## 2010-09-23 DIAGNOSIS — Y92009 Unspecified place in unspecified non-institutional (private) residence as the place of occurrence of the external cause: Secondary | ICD-10-CM

## 2010-09-23 DIAGNOSIS — E538 Deficiency of other specified B group vitamins: Secondary | ICD-10-CM | POA: Diagnosis present

## 2010-09-23 DIAGNOSIS — I2582 Chronic total occlusion of coronary artery: Secondary | ICD-10-CM | POA: Diagnosis present

## 2010-09-23 DIAGNOSIS — M899 Disorder of bone, unspecified: Secondary | ICD-10-CM | POA: Diagnosis present

## 2010-09-23 DIAGNOSIS — E785 Hyperlipidemia, unspecified: Secondary | ICD-10-CM | POA: Diagnosis present

## 2010-09-23 DIAGNOSIS — T82897A Other specified complication of cardiac prosthetic devices, implants and grafts, initial encounter: Secondary | ICD-10-CM | POA: Diagnosis present

## 2010-09-23 DIAGNOSIS — Z79899 Other long term (current) drug therapy: Secondary | ICD-10-CM | POA: Insufficient documentation

## 2010-09-23 DIAGNOSIS — Z96659 Presence of unspecified artificial knee joint: Secondary | ICD-10-CM

## 2010-09-23 DIAGNOSIS — E876 Hypokalemia: Secondary | ICD-10-CM | POA: Diagnosis present

## 2010-09-23 LAB — COMPREHENSIVE METABOLIC PANEL
ALT: 17 U/L (ref 0–35)
AST: 25 U/L (ref 0–37)
Alkaline Phosphatase: 51 U/L (ref 39–117)
Calcium: 9.8 mg/dL (ref 8.4–10.5)
Chloride: 99 mEq/L (ref 96–112)
GFR calc non Af Amer: 50 mL/min — ABNORMAL LOW (ref >60)
Potassium: 3.5 mEq/L (ref 3.5–5.1)
Sodium: 134 mEq/L — ABNORMAL LOW (ref 135–145)
Total Bilirubin: 0.6 mg/dL (ref 0.3–1.2)

## 2010-09-23 LAB — CBC
HCT: 41.9 % (ref 36.0–46.0)
Hemoglobin: 14.4 g/dL (ref 12.0–15.0)
MCHC: 34.4 g/dL (ref 30.0–36.0)
RBC: 4.73 MIL/uL (ref 3.87–5.11)
WBC: 12.3 10*3/uL — ABNORMAL HIGH (ref 4.0–10.5)

## 2010-09-23 LAB — PROTIME-INR: INR: 0.9 (ref 0.00–1.49)

## 2010-09-23 LAB — MRSA PCR SCREENING: MRSA by PCR: NEGATIVE

## 2010-09-23 LAB — POCT CARDIAC MARKERS: CKMB, poc: 2.1 ng/mL (ref 1.0–8.0)

## 2010-09-24 DIAGNOSIS — I219 Acute myocardial infarction, unspecified: Secondary | ICD-10-CM

## 2010-09-24 DIAGNOSIS — I2510 Atherosclerotic heart disease of native coronary artery without angina pectoris: Secondary | ICD-10-CM

## 2010-09-24 LAB — CARDIAC PANEL(CRET KIN+CKTOT+MB+TROPI)
CK, MB: 71.1 ng/mL (ref 0.3–4.0)
CK, MB: 42.4 ng/mL (ref 0.3–4.0)
Total CK: 278 U/L — ABNORMAL HIGH (ref 7–177)
Troponin I: 5.68 ng/mL (ref 0.00–0.06)

## 2010-09-24 LAB — CBC
MCH: 29.9 pg (ref 26.0–34.0)
MCHC: 33.8 g/dL (ref 30.0–36.0)
MCV: 88.4 fL (ref 78.0–100.0)
RBC: 4.22 MIL/uL (ref 3.87–5.11)

## 2010-09-24 LAB — BASIC METABOLIC PANEL
BUN: 12 mg/dL (ref 6–23)
CO2: 28 mEq/L (ref 19–32)
Calcium: 8.7 mg/dL (ref 8.4–10.5)
Chloride: 103 mEq/L (ref 96–112)
GFR calc Af Amer: >60 mL/min (ref >60)
Sodium: 137 mEq/L (ref 135–145)

## 2010-09-24 NOTE — H&P (Signed)
[name], [name]               ACCOUNT NO.:  [id number]  MEDICAL RECORD NO.:  [id number]           PATIENT TYPE:  O  LOCATION:  CATH                         FACILITY:  [facility]  PHYSICIAN:  Barron Alvine, MD        DATE OF BIRTH:  November 07, 1930  DATE OF ADMISSION:  09/23/2010 DATE OF DISCHARGE:  09/23/2010                             HISTORY & PHYSICAL   CHIEF COMPLAINT:  Chest pain and jaw pain.  HISTORY OF PRESENT ILLNESS:  The patient is a 77 year old female with a history of myocardial infarction in March 2006, status post 3-stent implantation in April 2006 in the setting of cardiac arrest in May 2006, from ventricular tachycardia, status post ICD implantation in the July 2006, who presented today with chest and jaw pain with STEMI.  At 3:00 a.m. on admission date, the patient woke up from sleep with significant jaw pain especially involving in both upper and lower jaw. She took 500 mg acetaminophen, which relieved the pain significantly. She went to church this morning with no significant discomfort. However, at 3:15 p.m. today, she started to feel pain again in her mouth/jaw.  She also had slight "chest pain."  Due to the persistent symptoms, she was brought by her brother to [facility] for further investigation.  In [facility] ER, she developed significant chest discomfort, described as "elephant sitting on my stomach."  She also had pain in both arms.  The study EKG showed ST elevation in lead V1-V5 as well as in V2-V3, and aVF.  She was subsequently brought to [facility] for emergent cardiac catheterization/PCI.  PAST MEDICAL HISTORY: 1. Myocardial infarction in March 2006. 2. Stent implantation in April 2006. 3. Cardiac arrest from ventricular tachycardia in May 2006, status     post ICD implantation in July 2006. 4. Hypertension. 5. Ischemic cardiomyopathy. 6. COPD. 7. Vitamin B12 deficiency. 8. Obstructive sleep apnea. 9.  Hypothyroidism. 10.Depression. 11.Hyperlipidemia. 12.Osteopenia.  REVIEW OF SYSTEMS:  10-organ system review of systems unremarkable except as described above.  ALLERGIES:  The patient reportedly has allergies to multiple agents: 1. BETADINE. 2. CARDURA. 3. OXYCODONE. 4. NEOMYCIN. 5. PREDNISONE. 6. CEPHALEXIN. 7. ADHESIVE TAPE. 8. NONSTEROIDAL ANTI-INFLAMMATORY AGENTS. 9. AUGMENTIN.  HOME MEDICATIONS: 1. Synthroid 112 mcg p.o. daily. 2. Digoxin 0.125 mg p.o. daily. 3. Chlorthalidone 25 mg p.o. daily. 4. Fish oil 1000 mg p.o. daily. 5. Aspirin 325 mg p.o. daily. 6. Atorvastatin 40 mg p.o. daily. 7. Potassium plus vitamin 1200 plus 1000 units p.o. daily. 8. Eplerenone 25 mg p.o. daily. 9. Sertraline 50 mg p.o. daily. 10.Metoprolol XL 100 mg p.o. daily. 11.Lisinopril 40 mg p.o. daily. 12.Plavix 75 mg p.o. daily. 13.Clonidine 0.1 mg p.o. twice daily. 14.Premarin 0.45 mg p.o. daily.  FAMILY HISTORY:  Father and mother had stroke at age 49.  Brother had a myocardial infarction at age 33, and one sister had myocardial infarction at age 61s, and another sister had CVA at age 50.  SOCIAL HISTORY:  The patient smokes cigarettes about 10 cigarettes per day in the past 60 years.  She denies alcohol use.  She lives alone. She  is a retired Programmer, applications.  She is widowed.  PHYSICAL EXAMINATION:  VITAL SIGNS:  Heart rate 66 beats per minute, blood pressure 130/55, respiration 17, O2 sat 97%. GENERAL:  The patient is status post PCI/stent implantation, in no acute distress. HEAD:  Atraumatic and normocephalic. EYES:  Pupils are equal, round, and reactive to light bilaterally. EARS AND NOSE:  Oropharynx is grossly unremarkable. NECK:  No JVD, thyromegaly, or bruits. LUNGS:  Clear to auscultation bilaterally. HEART:  Regular rhythm and rate with no murmur, rub, or gallop. ABDOMEN:  Soft, nondistended, and nontender.  Normoactive bowel sounds are present.  No  abnormality. EXTREMITIES:  No cyanosis, clubbing, or peripheral edema.  Status post wound of IV access with no hematoma. NEURO:  The patient is alert, awake, and oriented x3.  No focal deficits.  Cranial nerves II through XII grossly intact. SKIN:  No rash.  LABORATORY DATA:  EKG obtained from [facility] as described above in H and P.  ASSESSMENT AND PLAN:  In summary, the patient is 77 year old female with history of myocardial infarction, status post stent implantation in 2006, cardiac arrest from ventricular tachycardia in 2006, status post ICD implantation and ischemic cardiomyopathy who was brought in from [facility] with chest pain, EKG showing ST-elevation in multiple leads as described above.  PLAN:  ST-elevation myocardial infarction with EKG involving anterior and inferior leads.  The patient is emergently sent to cardiac cath lab for coronary angiography and PCI. Post cath management per interventional cardiologist Dr. Clifton James.         ______________________________ Barron Alvine, MD     RZ/MEDQ  D:  09/23/2010  T:  09/24/2010  Job:  [id number]  Electronically Signed by Barron Alvine MD on 09/24/2010 08:41:08 PM

## 2010-09-24 NOTE — Procedures (Signed)
[name], VERHAGEN               ACCOUNT NO.:  [id number]  MEDICAL RECORD NO.:  [id number]           PATIENT TYPE:  I  LOCATION:  2903                         FACILITY:  [facility]  PHYSICIAN:  Verne Carrow, MDDATE OF BIRTH:  06/04/1931  DATE OF PROCEDURE:  09/23/2010 DATE OF DISCHARGE:                           CARDIAC CATHETERIZATION   PRIMARY CARDIOLOGIST:  Learta Codding, MD, [facility]  PROCEDURES PERFORMED: 1. Left heart catheterization. 2. Selective coronary angiography. 3. Percutaneous transluminal coronary angioplasty with placement of a     drug-eluting stent in the first diagonal branch of the left     anterior descending artery. 4. Placement of an Angio-Seal femoral artery closure device.  OPERATOR:  Verne Carrow, MD  INDICATIONS:  Acute lateral ST elevation myocardial infarction.  DETAILS OF PROCEDURE:  The patient was brought emergently to the Cardiac Catheterization Laboratory by [facility].  She was placed supine on the cath table.  Her EKG was consistent with a lateral wall myocardial infarction.  Emergency consent was obtained.  The right groin was prepped and draped in a sterile fashion.  Lidocaine 1% was used for local anesthesia.  A 6-French sheath was inserted into the right femoral artery without difficulty.  Standard diagnostic catheters were used to perform selective coronary angiography.  We then elected to proceed intervention of the totally occluded diagonal branch of the left anterior descending artery.  The patient was given a bolus of Angiomax and the drip was started.  The patient was given additional 300 mg of Plavix.  A 6-French XB LAD 3.5 guiding catheter was used to selectively engage the left main artery.  A Cougar intracoronary wire was easily passed down the occluded diagonal branch.  Some flow was restored with the wire crossing the lesion.  A 2.0 x 12-mm balloon was carefully positioned in the  area of total occlusion at the ostium of the vessel was inflated.  Excellent flow was reestablished down the vessel with this balloon inflation.  A 2.25 x 16- mm Promus Element drug-eluting stent was carefully positioned at the ostium of the diagonal branch and was deployed without difficulty.  A 2.25 x 12-mm noncompliant balloon was carefully positioned inside the stented segment and was inflated to 17 atmospheres.  This balloon was inflated to 10 atmospheres.  The stenosis was taken from 100% to 0%. There was excellent flow down the vessel.  The patient had complete resolution of her chest pain.  The sheath was removed and an Angio-Seal femoral artery closure device was placed in the right femoral artery. The patient was taken to the CCU in stable condition.  HEMODYNAMIC FINDINGS:  Central aortic pressure 148/83.  Left ventricular pressure 145/21.  Left ventricular end-diastolic pressure 29.  ANGIOGRAPHIC FINDINGS: 1. The left main coronary artery had 20% stenosis. 2. The left anterior descending was large vessel that coursed to the     apex and gave off an early small-to-moderate-sized diagonal branch.     The diagonal branch was 100% occluded at the ostium.  The stent     present in the midportion of the left anterior descending artery,  had diffuse 60-70% in-stent restenosis.  The distal vessel had     plaque disease. 3. The circumflex artery had an early obtuse marginal branch, which     was patent.  The second obtuse marginal was small-to-moderate size     with an ostial 90% stenosis.  This was unchanged from     catheterization in 2006.  The third obtuse marginal branch had     patent stents in the mid and distal segment. 4. The right coronary artery has a large dominant vessel with 90% mid     stenosis. 5. No left ventricular angiogram was performed.  IMPRESSION: 1. Acute lateral wall ST elevation myocardial infarction. 2. Triple-vessel coronary artery disease with  moderate restenosis in     the mid left anterior descending artery stented segment and severe     stenosis in the proximal right coronary artery. 3. Successful percutaneous transluminal coronary angioplasty with     placement of a drug-eluting stent in the diagonal branch.  RECOMMENDATIONS:  The patient will be watched closely in the CCU tonight.  She will be continued on Angiomax for 2 hours.  We will continue aspirin, Plavix, beta-blocker and statin as well as her home medications.  We will obtain a 2-D echocardiogram tomorrow.  We will bring her back for a staged intervention of the right coronary artery and probable flow wire of the left anterior descending artery in 2 days.     Verne Carrow, MD     CM/MEDQ  D:  09/23/2010  T:  09/24/2010  Job:  [id number]  cc:   Learta Codding, MD,FACC  Electronically Signed by Verne Carrow MD on 09/24/2010 01:58:37 PM

## 2010-09-25 ENCOUNTER — Ambulatory Visit (HOSPITAL_COMMUNITY): Payer: Medicare Other | Admitting: Physical Therapy

## 2010-09-25 LAB — CBC
HCT: 39.3 % (ref 36.0–46.0)
MCV: 89.7 fL (ref 78.0–100.0)
RDW: 13.6 % (ref 11.5–15.5)
WBC: 10.7 10*3/uL — ABNORMAL HIGH (ref 4.0–10.5)

## 2010-09-25 LAB — BASIC METABOLIC PANEL
BUN: 13 mg/dL (ref 6–23)
Chloride: 104 mEq/L (ref 96–112)
GFR calc non Af Amer: 52 mL/min — ABNORMAL LOW (ref >60)
Glucose, Bld: 103 mg/dL — ABNORMAL HIGH (ref 70–99)
Potassium: 4.1 mEq/L (ref 3.5–5.1)

## 2010-09-26 LAB — POCT ACTIVATED CLOTTING TIME: Activated Clotting Time: 470 seconds

## 2010-09-26 LAB — MRSA PCR SCREENING: MRSA by PCR: NEGATIVE

## 2010-09-26 LAB — CBC
HCT: 41.1 % (ref 36.0–46.0)
MCHC: 33.8 g/dL (ref 30.0–36.0)
MCV: 89.3 fL (ref 78.0–100.0)
RDW: 13.6 % (ref 11.5–15.5)

## 2010-09-26 LAB — BASIC METABOLIC PANEL
BUN: 21 mg/dL (ref 6–23)
Calcium: 9.6 mg/dL (ref 8.4–10.5)
Creatinine, Ser: 1.09 mg/dL (ref 0.4–1.2)
GFR calc non Af Amer: 49 mL/min — ABNORMAL LOW (ref >60)
Glucose, Bld: 112 mg/dL — ABNORMAL HIGH (ref 70–99)

## 2010-09-27 ENCOUNTER — Ambulatory Visit (HOSPITAL_COMMUNITY): Payer: Medicare Other | Admitting: *Deleted

## 2010-09-27 LAB — BASIC METABOLIC PANEL
CO2: 26 mEq/L (ref 19–32)
Calcium: 9.2 mg/dL (ref 8.4–10.5)
Chloride: 103 mEq/L (ref 96–112)
GFR calc Af Amer: >60 mL/min (ref >60)
Sodium: 134 mEq/L — ABNORMAL LOW (ref 135–145)

## 2010-09-27 LAB — CBC
Hemoglobin: 13.3 g/dL (ref 12.0–15.0)
RBC: 4.39 MIL/uL (ref 3.87–5.11)

## 2010-09-28 DIAGNOSIS — I2129 ST elevation (STEMI) myocardial infarction involving other sites: Secondary | ICD-10-CM

## 2010-09-28 LAB — CBC
Hemoglobin: 13.2 g/dL (ref 12.0–15.0)
MCH: 30.6 pg (ref 26.0–34.0)
MCV: 89.6 fL (ref 78.0–100.0)
RBC: 4.32 MIL/uL (ref 3.87–5.11)
WBC: 11.9 10*3/uL — ABNORMAL HIGH (ref 4.0–10.5)

## 2010-09-28 LAB — BASIC METABOLIC PANEL
CO2: 28 mEq/L (ref 19–32)
Chloride: 104 mEq/L (ref 96–112)
Creatinine, Ser: 1.01 mg/dL (ref 0.4–1.2)
GFR calc Af Amer: >60 mL/min (ref >60)
Potassium: 4.2 mEq/L (ref 3.5–5.1)

## 2010-09-28 NOTE — Procedures (Signed)
NAMESENIE, [name]               ACCOUNT NO.:  [id number]  MEDICAL RECORD NO.:  [id number]           PATIENT TYPE:  I  LOCATION:  2922                         FACILITY:  [facility]  PHYSICIAN:  Verne Carrow, MDDATE OF BIRTH:  1930-07-24  DATE OF PROCEDURE:  09/26/2010 DATE OF DISCHARGE:                           CARDIAC CATHETERIZATION   PRIMARY CARDIOLOGIST:  Learta Codding, MD, [facility]  PROCEDURES PERFORMED: 1. PTCA with placement of drug-eluting stent in the mid right coronary     artery. 2. Fractional flow reserve of the mid left anterior descending artery.  OPERATOR:  Verne Carrow, MD  INDICATIONS:  This is a 78 year old Caucasian female who is admitted on September 23, 2010, with an anterolateral ST elevation myocardial infarction.  She was found to have a totally occluded diagonal branch of the left anterior descending artery.  I treated this with a drug-eluting stent.  She also was found to have a severe stenosis in the mid right coronary artery and a moderate stenosis in the stented segment of the mid left anterior descending artery.  I brought her back today for a staged PCI of the right coronary artery and a fractional flow reserve of the left anterior descending artery.  DETAILS OF PROCEDURE:  The patient was brought to the main cardiac catheterization laboratory after signing informed consent for the procedure.  The left groin was prepped and draped in sterile fashion. Lidocaine 1% was used for local anesthesia.  A 6-French sheath was inserted into the left femoral artery.  A 3-DRC guiding catheter was used to selectively engage the native right coronary artery.  A cougar intracoronary wire was used to cross the lesion that was passed into the distal vessel.  The patient was given a bolus of Angiomax at the beginning of the procedure and drip was started.  A 2.5 x 12-mm balloon was used to predilate the lesion.  A 3.0 x 12 mm Promus Element drug- eluting  stent was deployed in the mid right coronary artery in the area of tightest stenosis.  A 3.25 x 8-mm noncompliant balloon was positioned inside the stent was inflated to 16 atmospheres.  There was an excellent angiographic result.  At this point we turned our attention toward the moderate stenosis in the left anterior descending artery.  A pressure wire was passed across the lesion and a fractional flow reserve of 0.91 was measured.  This suggested that the stenosis was not significant. The guiding catheter was removed.  The patient was taken to the holding area in stable condition.  IMPRESSION: 1. Successful percutaneous transluminal coronary angioplasty with     placement of a drug-eluting stent in the mid right coronary artery. 2. Moderate stenosis in the left anterior descending artery that is     not flow limiting based on fractional flow reserve  RECOMMENDATIONS:  We will continue her current medical therapy.  She will be watched closely in the Step-Down ICU tonight.     Verne Carrow, MD     CM/MEDQ  D:  09/26/2010  T:  09/27/2010  Job:  [id number]  cc:   Learta Codding, MD,FACC  Electronically Signed by Verne Carrow MD on 09/28/2010 04:22:39 PM

## 2010-10-01 NOTE — Discharge Summary (Signed)
[name], [name]               ACCOUNT NO.:  [id number]  MEDICAL RECORD NO.:  [id number]           PATIENT TYPE:  I  LOCATION:  2922                         FACILITY:  [facility]  PHYSICIAN:  [name], MDDATE OF BIRTH:  [birth year]  DATE OF ADMISSION:  09/23/2010 DATE OF DISCHARGE:  09/28/2010                              DISCHARGE SUMMARY   PRIMARY CARDIOLOGIST:  Marie Codding, MD, [facility]  PRIMARY CARE PROVIDER:  Dr. Donzetta Dunlap.  DISCHARGE DIAGNOSIS:  Acute lateral ST-segment elevation myocardial infarction.  SECONDARY DIAGNOSES: 1. Coronary artery disease status post prior stenting of the third     obtuse marginal with drug-eluting stent placement within the first     diagonal and right coronary artery during this admission. 2. History of ventricular tachycardia arrest status post implantable     cardioverter-defibrillator placement. 3. Ischemic cardiomyopathy. 4. Chronic systolic congestive heart failure. 5. Hypertension. 6. Hyperlipidemia. 7. Chronic obstructive pulmonary disease. 8. B12 deficiency. 9. Obstructive sleep apnea. 10.Hypothyroidism. 11.Depression. 12.Osteopenia. 13.Spinal stenosis.  ALLERGIES:  NARDIL, PREDNISONE, CEPHALOSPORIN, NSAIDS, COX-2 INHIBITORS, IODINE, OXYCODONE/APAP, ADHESIVE,  AUGMENTIN, DOXAZOSIN, NEOMYCIN/BACITRACIN/POLYMYXIN.  PROCEDURES: 1. Emergent cardiac catheterization performed September 23, 2010,     revealing left main:  20%, LAD:  6% in-stent.  D1:  100% occluded -     infarct vessel.  Left circumflex OM1:  Patent OM2:  Ostial 90%, and     moderate-sized vessel unchanged from 2006.  OM3:  Patent stent.     RCA:  90% mid stenosis.  The first diagonal was successfully     stented with a 2.25 x 16-mm Promus Element Plus drug-eluting stent. 2. On September 24, 2010, 2-D echocardiogram EF of 35-40%, apex is     akinetic.  Moderate LVH.  PASD 39 mmHg. 3. On September 26, 2010, successful PCI and stenting of the right     coronary  artery with placement of 3.0 x 12-mm Promus Element Plus     drug-eluting stent.  Intravascular ultrasound within the LAD showed     a fractional flow reserve of 0.91, therefore, not flow limiting and     intervention not performed.  HISTORY OF PRESENT ILLNESS:  A 77 year old female with prior history of VF/VT arrest with LAD and third obtuse marginal stenting as well as ICD placement.  The patient was in her usual state of health until 3 a.m. on September 23, 2010, when she awoke suddenly with jaw pain, initially relieved by Tylenol.  Approximately 12 hours later, the patient had recurrent pain associated with chest discomfort and she presented to [facility].  In the [facility] ED, chest discomfort worsened and she was noted to have lateral ST-segment elevation.  Code STEMI was called and the patient was transported emergently to [facility] after catheterization.  HOSPITAL COURSE:  The patient underwent emergent diagnostic cardiac catheterization on September 23, 2010, revealing an occlusion of a small to moderate-sized first diagonal.  She also was noted to have a 90% stenosis in the mid RCA as well as 60% in-stent restenosis within the LAD.  The diagonal was felt to be the infarct vessel and this  was successfully stented with a 2.25 x 16-mm Promus Element Plus drug- eluting stent.  The patient was maintained on aspirin, Plavix, beta- blocker, statin therapy, and monitored in the coronary intensive care unit with plan for staged intervention to the right coronary artery as well as intravascular ultrasound within the LAD.  The patient had no additional chest discomfort and was taken back to the cath lab on September 26, 2010, where she underwent successful stenting as outlined above in the right coronary artery.  Intravascular ultrasound within the LAD revealed a normal fractional flow reserve and therefore intervention was not carried out.  The patient tolerated this procedure well  and postprocedure has been ambulating without recurrent symptoms and limitations.  She has been counseled on importance of smoking cessation. An outpatient cardiac rehab referral has been made.  She will be discharged home today in good condition.  DISCHARGE LABORATORY DATA:  Hemoglobin 13.2, hematocrit 38.7, WBC 11.9, platelets 192.  Sodium 137, potassium 4.2, chloride 104, CO2 of 28, BUN 20, creatinine 1.01, glucose 101, total bilirubin 0.6, alkaline phosphatase 51, AST 25, ALT 17, total protein 6.6, albumin 3.6, calcium 9.4, CK 177, MB 20.4, troponin-I 5.68.  MRSA screen was negative.  DISPOSITION:  The patient will be discharged home today in good condition.  FOLLOWUP APPOINTMENTS:  We have arranged for followup with Dr. Andee Lineman on October 29, 2009 at 9 a.m.  She was asked to follow up with her primary care provider in the next 1-2 weeks.  DISCHARGE MEDICATIONS: 1. Nitroglycerin 0.4 mg sublingual p.r.n. chest pain. 2. Clonidine 0.1 mg bedtime. 3. Metoprolol succinate 100 mg half a tablet bedtime. 4. Aspirin 325 mg daily. 5. Atorvastatin 40 mg daily. 6. Calcium carbonate plus D 1 tab daily. 7. Chlorthalidone 25 mg daily. 8. Digoxin 0.125 mg daily. 9. Eplerenone 25 mg bedtime. 10.Conjugated estrogen 0.45 mg bedtime. 11.Fish oil 1000 mg daily. 12.Hydrocodone/acetaminophen 5/325 1 tablet q.4-6 hours p.r.n. 13.Lisinopril 40 mg bedtime. 14.Levothyroxine 112 mcg daily. 15.Plavix 75 mg bedtime. 16.Sertraline 100 mg half tablet bedtime.  OUTSTANDING LABS AND STUDIES:  None.  Duration of discharge encounter 45 minutes including physician time.     [name], ANP   ______________________________ Marie Carrow, MD    CB/MEDQ  D:  09/28/2010  T:  09/29/2010  Job:  [id number]  cc:   [name]  Electronically Signed by [name] ANP on 10/01/2010 12:06:07 PM Electronically Signed by Marie Carrow MD on 10/01/2010 12:20:30 PM

## 2010-10-02 LAB — TYPE AND SCREEN: Antibody Screen: NEGATIVE

## 2010-10-02 LAB — BASIC METABOLIC PANEL
BUN: 11 mg/dL (ref 6–23)
BUN: 15 mg/dL (ref 6–23)
CO2: 35 mEq/L — ABNORMAL HIGH (ref 19–32)
Calcium: 8.7 mg/dL (ref 8.4–10.5)
Calcium: 9.8 mg/dL (ref 8.4–10.5)
Chloride: 103 mEq/L (ref 96–112)
Creatinine, Ser: 0.92 mg/dL (ref 0.4–1.2)
GFR calc non Af Amer: 52 mL/min — ABNORMAL LOW (ref >60)
GFR calc non Af Amer: 59 mL/min — ABNORMAL LOW (ref >60)
Glucose, Bld: 107 mg/dL — ABNORMAL HIGH (ref 70–99)
Glucose, Bld: 120 mg/dL — ABNORMAL HIGH (ref 70–99)
Potassium: 3.7 mEq/L (ref 3.5–5.1)
Potassium: 4.2 mEq/L (ref 3.5–5.1)
Potassium: 4.7 mEq/L (ref 3.5–5.1)
Sodium: 141 mEq/L (ref 135–145)

## 2010-10-02 LAB — CBC
HCT: 32 % — ABNORMAL LOW (ref 36.0–46.0)
HCT: 40.2 % (ref 36.0–46.0)
Hemoglobin: 13.6 g/dL (ref 12.0–15.0)
MCHC: 34 g/dL (ref 30.0–36.0)
MCV: 91.7 fL (ref 78.0–100.0)
MCV: 91.9 fL (ref 78.0–100.0)
Platelets: 147 10*3/uL — ABNORMAL LOW (ref 150–400)
Platelets: 149 10*3/uL — ABNORMAL LOW (ref 150–400)
RBC: 4.37 MIL/uL (ref 3.87–5.11)
RDW: 13.2 % (ref 11.5–15.5)
RDW: 14.1 % (ref 11.5–15.5)
WBC: 11.6 10*3/uL — ABNORMAL HIGH (ref 4.0–10.5)

## 2010-10-02 LAB — DIFFERENTIAL
Basophils Absolute: 0.1 10*3/uL (ref 0.0–0.1)
Basophils Relative: 1 % (ref 0–1)
Eosinophils Absolute: 0.2 10*3/uL (ref 0.0–0.7)
Eosinophils Relative: 3 % (ref 0–5)
Monocytes Absolute: 1 10*3/uL (ref 0.1–1.0)
Monocytes Relative: 10 % (ref 3–12)

## 2010-10-02 LAB — URINALYSIS, ROUTINE W REFLEX MICROSCOPIC
Bilirubin Urine: NEGATIVE
Glucose, UA: NEGATIVE mg/dL
Ketones, ur: NEGATIVE mg/dL
pH: 6.5 (ref 5.0–8.0)

## 2010-10-02 LAB — URINE MICROSCOPIC-ADD ON

## 2010-10-04 ENCOUNTER — Ambulatory Visit (INDEPENDENT_AMBULATORY_CARE_PROVIDER_SITE_OTHER): Payer: Medicare Other | Admitting: *Deleted

## 2010-10-04 DIAGNOSIS — I4901 Ventricular fibrillation: Secondary | ICD-10-CM

## 2010-10-05 ENCOUNTER — Other Ambulatory Visit: Payer: Self-pay | Admitting: Internal Medicine

## 2010-10-10 NOTE — Progress Notes (Signed)
Remote ICD check  

## 2010-10-14 ENCOUNTER — Encounter: Payer: Self-pay | Admitting: *Deleted

## 2010-10-17 ENCOUNTER — Telehealth: Payer: Self-pay | Admitting: Cardiovascular Disease

## 2010-10-17 NOTE — Telephone Encounter (Signed)
Pt needs to stop plavix for 5 days for back procedure. Dr Charlann Boxer [phone]

## 2010-10-17 NOTE — Telephone Encounter (Signed)
Will route this to Dr. Andee Lineman and his RN since she is planning on following up with him.

## 2010-10-17 NOTE — Telephone Encounter (Signed)
Patient states she is planning to have a myleogram.  Does have pending OV for 4/17 for post MI.  Would like to have okay before office visit if possible.  Advised her that GD would be out of office till 4/11 - will forward message & return call when get a reply form MD.  Patient verbalized understanding.

## 2010-10-19 NOTE — Telephone Encounter (Signed)
Patient should stop Plavix 7 days before myelogram.ASA should be continued if OK with Interventional Radiology

## 2010-10-23 ENCOUNTER — Other Ambulatory Visit: Payer: Self-pay | Admitting: Orthopedic Surgery

## 2010-10-23 ENCOUNTER — Telehealth: Payer: Self-pay | Admitting: Internal Medicine

## 2010-10-23 DIAGNOSIS — IMO0002 Reserved for concepts with insufficient information to code with codable children: Secondary | ICD-10-CM

## 2010-10-23 DIAGNOSIS — M5430 Sciatica, unspecified side: Secondary | ICD-10-CM

## 2010-10-23 DIAGNOSIS — M4800 Spinal stenosis, site unspecified: Secondary | ICD-10-CM

## 2010-10-23 NOTE — Telephone Encounter (Signed)
T.C. From [place] from [facility]-wanted to know how long patient needs to be off Plavix for myelogram.  Advised 7 days for Plavix but should continue Aspirin if [facility] with radiologist per Dr. Earnestine Leys.

## 2010-10-23 NOTE — Telephone Encounter (Signed)
[facility] has question re pt meds.

## 2010-10-25 ENCOUNTER — Encounter: Payer: Self-pay | Admitting: *Deleted

## 2010-10-29 ENCOUNTER — Encounter: Payer: Self-pay | Admitting: *Deleted

## 2010-10-30 ENCOUNTER — Ambulatory Visit (INDEPENDENT_AMBULATORY_CARE_PROVIDER_SITE_OTHER): Payer: Medicare Other | Admitting: Cardiology

## 2010-10-30 ENCOUNTER — Encounter: Payer: Self-pay | Admitting: Cardiology

## 2010-10-30 VITALS — BP 110/68 | HR 52 | Ht 62.0 in | Wt 142.8 lb

## 2010-10-30 DIAGNOSIS — M4800 Spinal stenosis, site unspecified: Secondary | ICD-10-CM

## 2010-10-30 DIAGNOSIS — I5022 Chronic systolic (congestive) heart failure: Secondary | ICD-10-CM

## 2010-10-30 DIAGNOSIS — I2589 Other forms of chronic ischemic heart disease: Secondary | ICD-10-CM

## 2010-10-30 DIAGNOSIS — I2510 Atherosclerotic heart disease of native coronary artery without angina pectoris: Secondary | ICD-10-CM

## 2010-10-30 DIAGNOSIS — I4901 Ventricular fibrillation: Secondary | ICD-10-CM

## 2010-10-30 MED ORDER — CLOPIDOGREL BISULFATE 75 MG PO TABS
300.0000 mg | ORAL_TABLET | Freq: Once | ORAL | Status: AC
  Administered 2010-10-30: 300 mg via ORAL

## 2010-10-30 NOTE — Patient Instructions (Signed)
   Follow up as scheduled.  Restart Plavix 75mg  daily as of now.  Okay to take 2 extra strength Tylenol two times a day (am and pm) with Tramadol for pain.

## 2010-10-30 NOTE — Assessment & Plan Note (Signed)
Status post recent acute low wall ST elevation microinfarction. Status post drug-eluting stent placement to the LAD diagonal and a staged procedure to the RCA. The patient will need to remain on dual antiplatelet therapy for at least a year prior to proceeding with any type of elective surgery.

## 2010-10-30 NOTE — Assessment & Plan Note (Signed)
No heart failure symptoms at the patient will need a followup echocardiogram the next 3 months.

## 2010-10-30 NOTE — Assessment & Plan Note (Signed)
I have asked the patient to discuss  pain management with her orthopedic doctor and primary care physician. At the present time the patient is not a candidate for myelogram because of the need of dual antiplatelet therapy in the short-term risk of stent thrombosis

## 2010-10-30 NOTE — Progress Notes (Signed)
HPI The patient is a 77 year old female with history of ischemic cardiomyopathy, status post anterior wall myocardial infarction. The patient is status post PCI. She has an ejection fraction of 46%. 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 The patient has a revised lead and scarring in magnet with her at all times. The patient has been on dual antiplatelet therapy . The patient had blood work done last month. Hemoglobin was stable at 13.2 potassium was 4.2. Creatinine has been stable. The patient has been scheduled for myelogram tomorrow and I had initially placed an approval note for her to have Plavix discontinued 1 week before the procedure Unbeknownst to me, as I could not tell from our new EMR system this patient had a myocardial infarction several weeks ago. The patient underwent staged stenting for an acute lateral ST segment myocardial infarction. She initially received a stent to the diagonal vessel the LAD following this she received a drug-eluting stent to the RCA this was on 09/26/2010. Her ejection fraction had decreased to 35-40%. In the interim I received a message earlier this month to provide clearance for the patient to have a myelogram. However nowhere in the EMR system [EPIC] was there any nodes indicating that the patient's a recent acute coronary syndrome/myocardial infarction and/or drug-eluting stent placement. Therefore I had initially cleared her to stop Plavix. However when she told me today that since last seeing me she had another myocardial infarction I immediately gave her 300 mg of Plavix in the office, and fortunately her procedure was scheduled for tomorrow. I told the patient that the procedure would be canceled and that she should have been told upon hospital discharge not to undergo an elective invasive procedures or surgeries within the first 6-12 months of drug-eluting stent placements. I  also asked the patient as off tomorrow to restart her Plavix at 75 mg by mouth daily. Although she has significant back pain as well as left leg pain have asked her to followup with her orthopedic physician and continue pain management for now. I told her that she cannot have any invasive procedure done for at least 6 months possibly one year. I explained to her the significant risk of acute stent thrombosis. She was prescribed by her primary care physician Ultram and have told her that she can take Tylenol in conjunction with this. Unfortunately there is also an interaction between sertraline and Ultram and asked her to address this with her primary care physician. A selection of other pain medication may be indicated. From a cardiovascular perspective she is currently stable. She denies any chest pain shortness of breath orthopnea PND. She has no palpitations or syncope.  Allergies  Allergen Reactions  . Cephalexin   . Doxazosin Mesylate   . Neomycin   . Nsaids   . Oxycodone Hcl   . Povidone-Iodine   . Prednisone     Current Outpatient Prescriptions on File Prior to Visit  Medication Sig Dispense Refill  . atorvastatin (LIPITOR) 40 MG tablet Take 40 mg by mouth daily.        . Calcium Carbonate-Vitamin D (CALTRATE 600+D) 600-400 MG-UNIT per tablet Take 1 tablet by mouth daily.       . chlorthalidone (HYGROTON) 25 MG tablet Take 25 mg by mouth daily.        . cloNIDine (CATAPRES) 0.1 MG tablet Take 0.1 mg by mouth daily.        . digoxin (  LANOXIN) 0.125 MG tablet Take 125 mcg by mouth daily.        Marland Kitchen eplerenone (INSPRA) 25 MG tablet Take 25 mg by mouth daily.        Marland Kitchen estrogens, conjugated, (PREMARIN) 0.45 MG tablet Take 0.45 mg by mouth daily. Take daily for 21 days then do not take for 7 days.       Marland Kitchen lisinopril (PRINIVIL,ZESTRIL) 40 MG tablet Take 40 mg by mouth daily.        . metoprolol (TOPROL-XL) 100 MG 24 hr tablet Take 1/2 tab (50mg ) twice a day      . Omega-3 Fatty Acids (FISH  OIL) 1000 MG CAPS Take 1 capsule by mouth every morning.       . clopidogrel (PLAVIX) 75 MG tablet Take 75 mg by mouth daily.        Marland Kitchen DISCONTD: aspirin 81 MG tablet Take 81 mg by mouth daily.        Marland Kitchen DISCONTD: levothyroxine (SYNTHROID, LEVOTHROID) 88 MCG tablet Take 88 mcg by mouth daily.        Marland Kitchen DISCONTD: sertraline (ZOLOFT) 50 MG tablet Take 50 mg by mouth daily.         No current facility-administered medications on file prior to visit.    Past Medical History  Diagnosis Date  . Unspecified essential hypertension   . Chronic systolic heart failure   . Automatic implantable cardiac defibrillator in situ   . Other specified forms of chronic ischemic heart disease   . Chronic airway obstruction, not elsewhere classified   . Obstructive sleep apnea   . Hyperlipidemia   . Hypothyroidism   . Depression   . Osteopenia   . CAD (coronary artery disease)     status post prior stenting of the third obtuse marginal with drug eluting stent placement within the first diagonal and right coronary artery   . B12 deficiency   . Spinal stenosis     Past Surgical History  Procedure Date  . Abdominal hysterectomy   . Appendectomy   . Tonsillectomy   . Total left knee replacement   . Icd implant     MEDTRONIC     Family History  Problem Relation Age of Onset  . Stroke Mother   . Stroke Father   . Heart attack Sister   . Heart attack Brother   . Stroke Sister     History   Social History  . Marital Status: Widowed    Spouse Name: N/A    Number of Children: N/A  . Years of Education: N/A   Occupational History  . RETIRED    Social History Main Topics  . Smoking status: Former Smoker    Quit date: 09/13/2010  . Smokeless tobacco: Not on file  . Alcohol Use: No  . Drug Use: No  . Sexually Active: Not on file   Other Topics Concern  . Not on file   Social History Narrative  . No narrative on file    PHYSICAL EXAM BP 110/68  Pulse 52  Ht 5\' 2"  (1.575 m)  Wt 142  lb 12.8 oz (64.774 kg)  BMI 26.12 kg/m2  General: Well-developed, well-nourished in no distress Head: Normocephalic and atraumatic Eyes:PERRLA/EOMI intact, conjunctiva and lids normal Ears: No deformity or lesions Mouth:normal dentition, normal posterior pharynx Neck: Supple, no JVD.  No masses, thyromegaly or abnormal cervical nodes Lungs: Normal breath sounds bilaterally without wheezing.  Normal percussion Cardiac: regular rate and rhythm with normal S1  and S2, no S3 or S4.  PMI is normal.  No pathological murmurs Abdomen: Normal bowel sounds, abdomen is soft and nontender without masses, organomegaly or hernias noted.  No hepatosplenomegaly MSK: Patient has difficulty with walking. She has significant back pain. She has decreased strength in the left leg.  Vascular: Pulse is normal in all 4 extremities Extremities: No peripheral pitting edema Neurologic: Alert and oriented x 3 Skin: Intact without lesions or rashes Lymphatics: No significant adenopathy Psychologic: Normal affect  ECG:  ASSESSMENT AND PLAN

## 2010-10-31 ENCOUNTER — Other Ambulatory Visit: Payer: Medicare Other

## 2010-11-27 NOTE — Assessment & Plan Note (Signed)
[facility]                         ELECTROPHYSIOLOGY OFFICE NOTE   NAME:HOPKINSLeylany, Nored                      MRN:          [id number]  DATE:01/13/2008                            DOB:          08/24/1930    Ms. Minardi returns today for followup.  She is a very pleasant elderly  woman with history of ischemic cardiomyopathy, congestive heart failure  class I to II, status post ICD insertion, who returns today for  followup.  She has had no intercurrent IC therapies.  She denies chest  pain or shortness of breath.  She does have this atypical chest pain,  which has been worked up by Dr. Andee Lineman with no obvious etiology.   MEDICATIONS:  1. Synthroid 0.125 daily.  2. Digoxin 0.125 daily.  3. Lisinopril 40 a day.  4. Zoloft 100 a day.  5. Inspra 25 mg daily.  6. Aspirin 81 mg daily.  7. Plavix 75 a day.  8. Lipitor 40 a day.  9. Premarin 0.45 mg daily.  10.HCTZ 25 daily.  11.Toprol-XL 100 daily.   PHYSICAL EXAMINATION:  GENERAL:  She is a pleasant elderly woman, in no  distress.  VITAL SIGNS:  Blood pressure was 135/85, pulse was 70 and regular,  respirations were 18, and weight was 175 pounds.  NECK:  No jugular distention.  LUNGS:  Clear bilaterally to auscultation.  No wheezes, rales, or  rhonchi are present.  No increased work of breathing.  CARDIOVASCULAR:  Regular rate and rhythm.  Normal S1 and S2.  The PMI is  not enlarged nor laterally displaced.  ABDOMINAL:  Soft and nontender.  EXTREMITIES:  No edema.   IMPRESSION:  1. Ischemic cardiomyopathy.  2. Congestive heart failure.  3. Status post implantable cardioverter-defibrillator insertion.   DISCUSSION:  Ms. Delira is stable and her defibrillator is working  normally.  We will see her back in the office in 1 year for ICD  followup.  The patient does have a 6949 lead, which has been stable.     Doylene Canning. Ladona Ridgel, MD  Electronically Signed    GWT/MedQ  DD: 01/13/2008  DT:  01/14/2008  Job #: [id number]

## 2010-11-27 NOTE — Assessment & Plan Note (Signed)
[facility]                          [facility]   NAME:[name], [name]                      MRN:          [id number]  DATE:12/18/2007                            DOB:          [birth year]    PRIMARY CARDIOLOGIST:  Dr. Andee Dunlap.   PRIMARY ELECTROPHYSIOLOGIST:  Dr. Lewayne Dunlap   REASON FOR VISIT:  [name] is a delightful 77 year old female with  history of severe ischemic cardiomyopathy and cardiac arrest, secondary  to ventricular fibrillation.  She is followed closely by Dr. Lewayne Dunlap for monitoring of her Medtronic defibrillator, which was  implanted in August 2006 for primary prevention.  Of note, she is due to  followup with Dr. Ladona Dunlap in our [facility], this coming July 1.   The patient has not been seen here in our [facility] for general  cardiology followup since 2006.  This was following her last  catheterization in July of that year, at which time she underwent  overlapping non - drug-eluting stenting of a high-grade stenosis of the  3rd obtuse marginal branch.  She otherwise had continued patency of the  previously placed LAD stent sites.  There was also a moderately severe  stenosis of the OM2 branch, but this was felt to be unfavorable for  percutaneous intervention.  She was also noted to have moderate  pulmonary hypertension and moderate LV dysfunction (EF 30% - 35%).   Most recent 2-D echo in June of that year also suggests an EF of 30% -  35%, with mild mitral regurgitation and moderate pulmonary hypertension.   The patient states that she was in her usual state of health until  approximately 2 weeks ago, over which time she has developed recurrent,  nonexertional chest discomfort.  She awakens with this in the morning  and she reports exacerbation with either movement of her torso  laterally, or with bending over.  She continues to remain quite active,  however, and denies any exertion - induced chest  discomfort.  She also  denies any defibrillator shocks, orthopnea, PND, lower extremity edema,  or tachy palpitations.  She denies any direct trauma to the sternum, or  recent muscular strain.   Patient also denies any symptoms of reflux disease.   Unfortunately, the patient continues to smoke.   Electrocardiogram today reveals NSR at 66 bpm with normal axis and  nonspecific chronic changes.   MEDICATIONS:  1. Synthroid.  2. Lanoxin 0.125 every day.  3. Eplerenone 25 every day.  4. Hydrochlorothiazide 25 daily.  5. Metoprolol 100 every day.  6. Lisinopril 40 every day.  7. Full-dose aspirin.  8. Plavix.  9. Fish oil 1000 every day.  10.Lipitor 40 at bedtime.  11.Zoloft 100 every day.  12.Premarin.   PHYSICAL EXAM:  Blood pressure 135/86.  Pulse 70 regular.  Weight 172.  GENERAL:  A 77 year old female sitting upright,  in no distress.  HEENT:  Normocephalic, atraumatic.  NECK:  Palpable bilateral carotid pulses with faint left supraclavicular  bruit.  No JVD.  LUNGS:  Clear to auscultation all fields.  HEART:  Regular rhythm (  S1, S2) positive 4.  No significant murmurs.  No  rubs.  ABDOMEN:  Protuberant, nontender, intact bowel sounds.  EXTREMITIES:  Palpable bilateral femoral pulses without bruits.  Intact  distal pulses without edema.  NEURO:  No focal deficit.   IMPRESSION:  1. Atypical chest pain.  2. Severe ischemic cardiomyopathy.      a.     Ejection fraction 30% - 35%.      b.     Status post prior anterior myocardial infarction and       subsequent multiple stenting, the last in July 2006.  3. History of sudden cardiac death.      a.     Secondary to VF arrest.      b.     Status post Medtronic ICD implantation in 2006.  4. Chronic systolic heart failure, stable.  5. Dyslipidemia.  6. Hypertension.  7. Treated hypothyroidism.  8. Left carotid bruit.  9. Chronic obstructive pulmonary disease, ongoing tobacco.   PLAN:  1. Exercise stress Cardiolite for  risk stratification.  2. A 2-D echocardiogram for reassessment of LV function and severity      of mitral regurgitation.  3. Carotid Dopplers for assessment of a left-sided bruit.  4. Fasting lipid profile.  5. Schedule return clinic followup with myself and Dr. Andee Dunlap in 1      month, for review of study results and further recommendations.      Marie Serpe, PA-C  Electronically Signed      Marie Codding, MD,FACC  Electronically Signed   GS/MedQ  DD: 12/18/2007  DT: 12/18/2007  Job #: [id number]   cc:   [name]

## 2010-11-27 NOTE — Assessment & Plan Note (Signed)
[facility]                          [facility]   NAME:Noland, BAYAN HEDSTROM                      MRN:          [id number]  DATE:07/21/2008                            DOB:          04-Jun-1931    HISTORY OF PRESENT ILLNESS:  The patient is a very pleasant 77 year old  female known well to our practice.  I had not personally seen the  patient in a couple of years.  She does have a known ischemic  cardiomyopathy and interestingly suffered a myocardial infarction after  her prior knee surgery several years ago.  Her ejection fraction,  however, has improved and is in the range of 50-55%.  She received a  Medtronic implantable defibrillator (Maximo VR) which was implanted in  2006.  She has chronic systolic heart failure, but she is essentially  NYHA class I, if she was not limited by her painful left knee.  She  states to me that she needs a left knee replacement because she has  excruciating left knee pain.  Interestingly, though she mentions to me  that she has pain in the left leg and she questioned me about this why  that she has pain in the upper thigh and lower leg and how this could be  coming from the knee.  She also states that this pain is actually much  better when she stands and when she sits down which is countering to it,  may be this was her knee problem.  I told her that these could represent  pseudoclaudication where her symptoms would get better with standing,  but lying and sitting would make her symptoms worse.  She has no prior  history of back surgery or back problems.  From a cardiac standpoint,  however, she is stable.  She denies any shortness of breath or chest  pain.  She had a stress test done approximately 6 months ago which was  negative for ischemia with an ejection fraction in the 46% range.  She  has had no recent heart failure exacerbations.   MEDICATIONS:  1. Synthroid 0.112 mg p.o. daily.  2. Lanoxin 0.125  mg p.o. daily.  3. Eplerenone 25 mg p.o. daily.  4. Hydrochlorothiazide 25 mg daily.  5. Metoprolol XL 100 mg p.o. daily.  6. Lisinopril 40 mg p.o. daily.  7. Fish oil 1000 mg p.o. daily.  8. Caltrate.  9. Lipitor 40 mg p.o. nightly.  10.Plavix 75 mg daily.  11.Zoloft 100 mg p.o. daily.  12.Premarin 0.45 mg daily.  13.Aspirin 81 mg a day.  14.Clonidine 0.1 mg p.o. nightly.   PHYSICAL EXAMINATION:  VITAL SIGNS:  Blood pressure is 153/88, heart  rate is 62, and weight is 162 pounds.  GENERAL:  Well-nourished white female in no apparent distress.  HEENT:  Pupils isocoric.  Conjunctivae clear.  NECK:  Supple.  Normal carotid upstroke.  No carotid bruits.  LUNGS:  Clear breath sounds bilaterally.  HEART:  Regular rate and rhythm.  Normal S1 and S2.  No murmur, rubs, or  gallops.  ABDOMEN:  Soft and nontender.  No rebound or  guarding.  Good bowel  sounds.  EXTREMITIES:  No cyanosis, clubbing or edema.  Dorsalis pedis and  posterior tibial pulses are intact.  I also want to mention that she has  a faint left supraclavicular bruit.   PROBLEM LIST:  1. Ischemic cardiomyopathy.      a.     Ejection fraction 50-55% by echo, 46% by nuclear imaging.      b.     Status post anterior wall myocardial infarction, subsequent       multiple stenting in July 2006.  2. History of sudden cardiac death.      a.     Secondary to ventricular fibrillation arrest.      b.     Status post Medtronic implantable cardioverter-defibrillator       implantation in 2006.  3. Chronic systolic heart failure, New York Heart Association class I.  4. Hypertension, controlled.  5. Chronic obstructive pulmonary disease.  6. Right vertebral artery stenosis.  7. Rule out pseudoclaudication.  8. Scheduled knee surgery.   PLAN:  1. From a cardiac perspective, the patient is stable.  She is at      moderate risk for cardiac complications given her ischemic      cardiomyopathy.  She did have a stress test 6 months  ago which      showed no ischemia.  No further testing is required.  The patient      will need to continue perioperatively on beta-blocker therapy.  2. The patient does get symptoms that could be concerning for      pseudoclaudication and I have ordered a CT scan of the lumbosacral      spine to make sure that she does not have spinal stenosis or cauda      equina syndrome.  3. We will also notify Medtronic representative at the time of      scheduled surgery for defibrillator deactivation.     Learta Codding, MD,FACC  Electronically Signed    GED/MedQ  DD: 07/21/2008  DT: 07/22/2008  Job #: [id number]   cc:   Madlyn Frankel Charlann Boxer, M.D.

## 2010-11-27 NOTE — Assessment & Plan Note (Signed)
[facility]                          [facility]   NAME:HOPKINSIqra, Rotundo                      MRN:          [id number]  DATE:01/08/2008                            DOB:          05/09/1931    PRIMARY CARDIOLOGIST:  Learta Codding, MD, [facility].   PRIMARY ELECTROPHYSIOLOGIST:  Doylene Canning. Ladona Ridgel, MD.   REASON FOR VISIT:  Scheduled followup.  Please refer to my recent clinic  note of December 18, 2007, for full details.   At that time, I referred Ms. Killings for extensive noninvasive workup  for surveillance of her known, severe ischemic cardiomyopathy.  As I had  previously noted, she had not been seen in our general Cardiology Clinic  since December 2006.   The 2-D echo showed a significant improvement in LVF with estimated EF  of 50-55%.  This compared to a previous echo in 2006, suggesting EF of  30-35%.   The adenosine stress test revealed a moderate apical scar, but no  obvious ischemia.  The EF was 46%.  The patient had no associated chest  discomfort.   I also ordered carotid Dopplers, for evaluation of her left carotid  bruit.  This suggested less than 50% bilateral ICA stenosis.  However,  there was evidence of 100% occlusion of the right vertebral artery.   I also ordered a lipid profile:  Total cholesterol 186, triglyceride  325, HDL 33, and LDL 88, this was on Lipitor 40 and 1 tablet fish oil,  for which I recommended uptitrating to b.i.d. dosing.   Clinically, Ms. Laforest continues to have the same nonexertional,  persistent chest discomfort, which she described at the time of her last  visit.  This is clearly not precipitated by exertion, nor exacerbated by  it.  If anything, she tells me today that it is made worse when she  turns her head to the right.  It is present in the morning upon  awakening, and there is also some reproducibility by deep palpation of  the right sternum.   As I previously noted, the patient continues  to smoke.   EKG today reveals an SR at 65 bpm with nonspecific changes and evidence  of prior anterolateral infarct, as well as possible inferior infarct.   MEDICATIONS:  As previously outlined, with uptitration of the fish oil  to b.i.d. dosing.   PHYSICAL EXAMINATION:  VITAL SIGNS:  Blood pressure 152/91; pulse 65,  regular; and weight 171 (down to 1).  GENERAL:  A 77 year old female, sitting upright, in no distress.  HEENT:  Normocephalic and atraumatic.  NECK:  Palpable carotid pulses with faint, left supraclavicular bruit.  No JVD.  LUNGS:  Clear to auscultation in all fields.  HEART:  Regular rate and rhythm (S1S2) positive S4.  No significant  murmurs.  No rubs.  ABDOMEN:  Protuberant, nontender.  EXTREMITIES:  Intact distal pulses with trace pedal edema.  NEURO:  No focal deficit.  SKIN:  Large area of ecchymosis on the left forearm.   IMPRESSION:  1. Noncardiac chest pain.      a.  Recent, non-ischemic exercise stress Cardiolite with       moderate apical scar; ejection fraction 46%.      b.     Not relieved by nitroglycerin.      c.     Suspect musculoskeletal etiology  2. Ischemic cardiomyopathy.      a.     Left ventricular ejection fraction improved to 50-55% by       current 2D echo.      b.     Ejection fraction 30-35% by 2D echo in 2006.      c.     Status post anterior myocardial infarction and subsequent       multiple stenting, the last in July 2006.  3. History of sudden cardiac death.      a.     Secondary to ventricular fibrillation arrest.      b.     Status post Medtronic implantable cardioverter-defibrillator       implantation in 2006.  4. Chronic systolic heart failure.      a.     Current New York Heart Association class I.  5. Mixed dyslipidemia.  6. Hypertension.  7. Chronic obstructive pulmonary disease, ongoing tobacco.  8. Cardiovascular disease.      a.     100% right vertebral artery stenosis.  9. Hypothyroidism.   PLAN:  1.  Reassurance and continued medical therapy.  2. Down titrate aspirin to 81 mg daily.  The patient is on long-term      Plavix and has evidence of easy bruisability.  3. Schedule return clinic followup with myself and Dr. Andee Lineman in 3      months.  She will need FLP/LFTs at that time.      Gene Serpe, PA-C  Electronically Signed      Learta Codding, MD,FACC  Electronically Signed   GS/MedQ  DD: 01/08/2008  DT: 01/09/2008  Job #: [id number]   cc:   Donzetta Sprung

## 2010-11-27 NOTE — Assessment & Plan Note (Signed)
[facility]                         ELECTROPHYSIOLOGY OFFICE NOTE   NAME:HOPKINSArlin, [name]                      MRN:          [id number]  DATE:01/20/2007                            DOB:          1931-04-19    [name] returns today for follow-up.  She is a very pleasant woman  with a history of ischemic heart disease and cardiomyopathy who is  status post ICD insertion.  She returns today for follow-up.  She denies  chest pain.  She denies shortness of breath.  She continues to be quite  active, working in her garden on a regular basis.  She denies peripheral  edema.  She has no claudication.   MEDICATIONS:  1. Synthroid 125 a day.  2. Digoxin 0.125 a day.  3. Lisinopril 40 mg a day.  4. Zoloft 100 a day.  5. Inspra 25 a day.  6. Enteric coated aspirin 325 a day.  7. Plavix 75 a day.  8. Hydrochlorothiazide 25 a day.  9. Toprol XL 100 a day.  10.Premarin.   PHYSICAL EXAMINATION:  GENERAL APPEARANCE:  A pleasant, well-appearing  woman in no acute distress.  VITAL SIGNS:  Blood pressure 140/88, pulse 83 and regular, respirations  18, weight 173 pounds.  NECK:  No jugular venous distension.  LUNGS:  Clear to auscultation bilaterally.  No wheezing, rhonchi or  rales are present.  CARDIOVASCULAR:  Regular rate and rhythm with normal S1 and S2.  There  were no murmurs, rubs, or gallops appreciated.  EXTREMITIES:  No edema.   Interrogation of her defibrillator demonstrates a Medtronic Maximo with  R-waves of 8, impedance 512 ohms, threshold 0.5 at 0.4.  The battery  voltage was 3.17 volts.  There are no intercurrent IC therapies.  There  was no evidence of any degradation of her Sprint Fidelis lead.   IMPRESSION:  1. Ischemic cardiomyopathy.  2. Congestive heart failure.  3. Status post implantable cardioverter defibrillator insertion.   DISCUSSION:  Overall [name] is stable.  She continues to be very  active and her defibrillator is  working normally.  We will plan to see  her back in one year and she will follow up in our [facility] program.     Doylene Canning. Ladona Ridgel, MD  Electronically Signed    GWT/MedQ  DD: 01/20/2007  DT: 01/20/2007  Job #: [id number]

## 2010-11-30 NOTE — Discharge Summary (Signed)
[name], [name]               ACCOUNT NO.:  [id number]   MEDICAL RECORD NO.:  [id number]          PATIENT TYPE:  INP   LOCATION:  4742                         FACILITY:  [facility]   PHYSICIAN:  Charlies Constable, M.D. [facility] DATE OF BIRTH:  Aug 23, 1930   DATE OF ADMISSION:  11/27/2004  DATE OF DISCHARGE:  12/03/2004                           DISCHARGE SUMMARY - REFERRING   HISTORY OF PRESENT ILLNESS:  Ms. Balderrama is a 77 year old female who  recently underwent total knee replacement on April 24 by Dr. Valentina Gu on the  morning of admission when she developed chest discomfort and shortness of  breath and called EMS and transported to [facility] emergency room at which  point she developed ventricular fibrillation requiring cardioversion as well  as pulmonary edema requiring intubation. She was transferred to [facility] for  further evaluation.   PAST MEDICAL HISTORY:  Her medical history is notable for hypertension,  hypothyroidism, Bell's palsy, irregular heart beat and her ear problems,  dyslipidemia, recent UTI, osteoarthritis, hyperlipidemia.   LABORATORY:  Chest x-ray showed mild pulmonary edema, endotracheal tube in  good position.  Abdominal series showed an EGD placement.  Repeat chest x-  rays on the <Nov 27, 2004 and Nov 28, 2004 showed improved pulmonary edema  on Nov 29, 2004 status post endotracheal tube removal.  Repeat chest x-rays  on the Nov 30, 2004 showed stable and improved basilar aeration.  Admission  weight 170.7 and discharge weight 169.1.  Admission ABG on vent showed a pH  of 7.36, PCO2 of 43.4, PO2 288, 99.7% saturation on 100%.  Admission H&H was  12.2 and 36.2,  normal indices, platelets 422, WBCs 12.1.  Subsequent  hematology showed a gradual drop to a low of 9.0 and 26.9 on Nov 29, 2004.  Normal indices, platelets 305, WBC 14.4.  Post transfusion on the Nov 29, 2004 H&H of 10.8 10.8 and 323.  H&H prior to discharge on the Dec 01, 2004  was 10.7 and 31.6, normal indices,  platelets 314, WBCs 10.4.  Admission PT  was 11.8, PTT 25.  Sodium 138, potassium 36, BUN 12, creatinine 1.00,  total  bilirubin was slightly low at 0.2, albumin was low at 3.2.  Subsequent  chemistries were notable for a potassium of 2.7 on the Nov 27, 2004 which  was later 2.9.  Later on the Nov 27, 2004 AST had rose to 298, ALT to 54.  On Nov 28, 2004, potassium was 4.0.  By  Nov 30, 2004 her AST and ALT had  improved 60 and 26.  Last chemistry on Dec 01, 2004 showed a sodium 140,  potassium 42, BUN 10 and creatinine 0.9, hemoglobin A1c was 5.8 on Nov 28, 2004.  On Nov 27, 2004 peak CK total MB was 3295 and 269.3. Troponin was  greater than 100.  Subsequent CK total MBs were declining.  It is noted that  with her first CK-MB and troponin.  Fasting lipids on the Nov 28, 2004  showed a total cholesterol 114, triglycerides 173, HDL 40, LDL 39, TSH  3.692. Urinalysis on Nov 28, 2004 showed  moderate blood and some protein.  Subsequent urinalysis showed was essentially unremarkable except for many  bacteria, questionable contaminants.  Blood cultures were negative. Urine  culture on Dec 01, 2004 showed possible Enterococcus species.   HOSPITAL COURSE:  Ms. Plaut was taken emergently to the catheterization  lab.  Please refer to dictated catheterization report by Dr.  Gerri Spore.  He  used a Designer, television/film set stent to open up her mid LAD reducing this from a 99% lesion  with thrombus to 0%.  Balloon pump was also placed.  It is noted that her EF  was approximately 30%. She does have residual disease particularly in a  branch off the circumflex with plans for later intervention after her MI  recovery.  Pulmonary saw the patient on Nov 27, 2004 for vent management.  She remained stable in the CCU on Integrilin and heparin.  It was noted that  she was having some epistaxis and a nasal trumpet was placed by pulmonary.  On  Nov 28, 2004 sedation was decreased and she was following simple  commands. By later  on Nov 28, 2004 she was extubated without difficulty.  Cardiac rehab began initial evaluation.  Ortho saw the patient on 11/28/2004  and began PT on the right knee.  When okay with cardiology it was felt that  she should begin PT once she arrives on the floor.  Dr. Lanna Poche questioned  her ST segments on the EKGs that there was an aneurysm are not.  There is no  evidence of ongoing ischemia.  She had an episode of atrial fibrillation  with rapid ventricular rate, thus digoxin was started and Coreg was changed  back to Lopressor.  An echocardiogram was performed and this revealed an EF  of 45-50%, mid apical and apical inferior wall akinesis, anteroseptal  hypokinesis, anterior hypokinesis.  Dr. Myrtis Ser noted that on some views,  however, there was good movement of the septum. She converted back to normal  sinus rhythm on Nov 28, 2004 that afternoon.  Pulmonary signed off on Nov 29, 2004 noting to avoid nasal prongs given her problems with epistaxis. She  did develop some anemia which was felt to be secondary to her epistaxis.  She received one unit packed RBCs. Physical therapy began assisting the  patient on Nov 29, 2004 when she was transferred to the floor. The wound  pump had been discontinued prior to her transfer.  Cardiac rehab began  assisting with education and ambulation. The patient also began complaining  of hot flashes and it was noted that her Premarin had not been resumed on  admission, however, the physicians did not want to begin this medication at  this time but felt that she should follow up with Dr. Orvan Falconer for hormone  alternatives. Case management also assisted with discharge needs and by Nov 16, 2004 it was felt that she could be discharged home.  Dr. Dorethea Clan noted  that the patient wished to follow up with [facility] in [place] and  wished to eventually participate in cardiac rehab at [facility].  DISCHARGE DIAGNOSES:  1.  Acute anterior myocardial infarction  complicated by V-fib arrest and      acute pulmonary edema requiring defibrillation and intubation.  Urgent      catheterization with Cipher stenting to the mid LAD and intra-aortic      balloon pump insertion.  Residual coronary artery disease.  2.  Epistaxis resulting in blood loss.  3.  Hypokalemia.  4.  Hyperlipidemia.  5.  Hyperglycemia without a history of diabetes, probably secondary to      myocardial infarction and atrial fibrillation with a rapid ventricular      rate on Nov 28, 2004, normocytic anemia requiring transfusion on Nov 28, 2004.  6.  Hot flashes.  7.  Status post right total knee replacement on November 05, 2004.  History as      previously mentioned.   DISPOSITION:  She is discharged home.   DISCHARGE MEDICATIONS:  1.  She is instructed not to take her Premarin, XT and HCTZ.  2.  Her new prescriptions include Plavix 75 mg daily for at least one year.  3.  Her aspirin was increased to 325 mg daily.  4.  Aldactone 25 mg daily.  5.  Digoxin 0.125 daily.  6.  Nitroglycerin 0.4 as needed.  7.  Lisinopril was changed to 20 mg b.i.d.  8.  Her Toprol XL changed to 100 mg b.i.d.  9.  She was asked to continue her Synthroid 0.125 mcg daily and her Lipitor      40 mg q.h.s. and her Zoloft 50 mg daily.   DISCHARGE INSTRUCTIONS:  She is advised no lifting, driving, sexual activity  or heavy exertion until seen by the physician. Maintain low salt, low fat  cholesterol diet. She was asked to continue her therapy per orthopedics when  she had recovered from her knee surgery. She will participate in cardiac  rehab.  She will follow up with Dr. Lanna Poche on December 19, 2004 at 1:15.  He will  review her blood work that was obtained on December 17, 2004 at our [place]  office for a CBC and BMP to assure that her potassium, anemia and kidney  function are intact.  She will arrange a follow-up appointment with Dr.  Orvan Falconer particularly in regard to hormone alternatives and keep any  schedule  appointments with Dr. Sherlean Foot.      EW/MEDQ  D:  12/03/2004  T:  12/03/2004  Job:  [id number]   cc:   Marcelino Duster  [address] [place]  [place] [address]  Fax: [phone]   Mila Homer. Sherlean Foot, M.D.  [address] [place]  [place] [address]  Fax: [phone]   Learta Codding, M.D. [facility]

## 2010-11-30 NOTE — Discharge Summary (Signed)
[name], [name]               ACCOUNT NO.:  [id number]   MEDICAL RECORD NO.:  [id number]          PATIENT TYPE:  OIB   LOCATION:  6532                         FACILITY:  [facility]   PHYSICIAN:  Learta Codding, M.D. LHCDATE OF BIRTH:  1930/09/21   DATE OF ADMISSION:  01/18/2005  DATE OF DISCHARGE:  01/19/2005                                 DISCHARGE SUMMARY   PROCEDURES:  Bare metal stenting, third obtuse marginal artery, January 18, 2005.   REASON FOR ADMISSION:  Ms. Metzinger was a 77 year old female, status post  acute anterolateral myocardial infarction May 16 of this year, complicated  by ventricular fibrillation requiring defibrillation and with subsequent  emergent coronary angiography/drug-eluting stenting of the left anterior  descending. She now presents for elective percutaneous intervention of  residual obtuse marginal disease. Please refer to Dr. Margarita Mail admission  note for full details.   LABORATORY DATA:  Hemoglobin 11.7, hematocrit 35, platelet 202 at discharge.  Sodium 39, potassium of 3.5, BUN 3, creatinine 0.9 at discharge.   HOSPITAL COURSE:  The patient presented for elective percutaneous  intervention, performed by Dr. Bonnee Quin, with successful bare metal  stenting of the third obtuse marginal (x2) on July 7, with no noted  complications.   Dr. Riley Kill also noted moderately severe residual disease of the second  obtuse marginal artery, with recommended medical therapy.   Moderate LVD was noted (EF 30%) with 2-3+ mitral regurgitation.   The patient was kept for overnight observation, cleared for discharge  following morning in hemodynamically stable condition. Mild hypokalemia  (3.5) was repleted prior to discharge.   The patient will resume all previous home medications.   MEDICATIONS AT DISCHARGE:  1.  Plavix 75 mg q.d.  2.  Coated aspirin 325 mg q.d.  3.  Synthroid 0.125 mg q.d.  4.  Digitek 0.125 mg q.d.  5.  Zoloft 15 mg q.d.  6.  Toprol XL 100  mg b.i.d.  7.  Lisinopril 20 mg b.i.d.  8.  Eplerenone 25 mg q.d.  9.  Lipitor 40 mg q.d.  10. Vicodin p.r.n.  11. Nitrostat 0.4 mg as directed.   INSTRUCTIONS:  The patient is to refer to cardiac catheterization discharge  instructions for questions.   Follow-up with Dr. Andee Lineman in two weeks in [place] - office will call with  arrangements.   DISCHARGE DIAGNOSES:  1.  Multivessel coronary artery disease.      1.  Status post bare metal stenting third obtuse marginal (x2) January 18, 2005.      2.  Residual moderately severe second obtuse marginal disease - treated          medically.      3.  Moderate left ventricular dysfunction (EF approximately 30%).      4.  Status post acute anterolateral myocardial infarction/complicated by          ventricular fibrillation - treated with emergent drug-eluting          stenting, mid left anterior descending artery, on Nov 27, 2004.  2.  Hyperlipidemia.  3.  Hypothyroidism.  4.  Mild hypokalemia.       GS/MEDQ  D:  01/19/2005  T:  01/19/2005  Job:  [id number]   cc:   Marcelino Duster  [address] [place]  [place] [address]  Fax: [phone]

## 2010-11-30 NOTE — H&P (Signed)
NAMESAMUEL, RITTENHOUSE               ACCOUNT NO.:  [id number]   MEDICAL RECORD NO.:  [id number]          PATIENT TYPE:  INP   LOCATION:  NA                           FACILITY:  [facility]   PHYSICIAN:  Mila Homer. Sherlean Foot, M.D. DATE OF BIRTH:  1931-03-11   DATE OF ADMISSION:  11/05/2004  DATE OF DISCHARGE:                                HISTORY & PHYSICAL   CHIEF COMPLAINT:  Right knee pain.   HISTORY OF PRESENT ILLNESS:  Ms. Droz is a 77 year old female with  bilateral knee pain, right greater than left.  Right knee pain has been  present for at least 20 years.  Mechanical symptoms are positive for  walking.  No waking pain.  She uses no assistive devices.  She describes the  right knee pain as intermittent pain that mostly occurs with ambulation.  She has difficulty getting up from a sitting position and going up and down  stairs.  The patient has had no injections into the right knee.  She does  occasionally use knee support which helps with pain.  Right knee x-rays  showed bone-on-bone in the medial compartment with varus deformity of  approximately 7 to 10 degrees.   ALLERGIES:  1.  NORVASC causes back pain.  2.  PREDNISONE, stomach cramps.  3.  CEPHALEXIN.  4.  MOTRIN, stomach upset.  5.  FELDENE.  6.  NAPROXEN, stomach upset.  7.  ALEVE, hemoptysis and stomach upset.  8.  BETADINE causes rash.  9.  BAND-AID TAPE causes rash.  10. NEOSPORIN causes rash.   MEDICATIONS:  1.  Synthroid 125 mcg 1 p.o. daily q.a.m.  2.  HCTZ 25 mg 1 p.o. daily in a.m.  3.  Zoloft 50 mg 1 p.o. daily.  4.  Toprol XL 200 mg 1 daily in the p.m.  5.  Lipitor 40 mg 1 p.o. daily p.m.  6.  Lisinopril 40 mg 1 p.o. daily a.m.  7.  Premarin 0.5 mg 1 p.o. daily.  8.  Aspirin 81 mg 1 p.o. daily (stopped October 30, 2004).  9.  Taztia XT 240 mg p.o. daily a.m.   PAST MEDICAL HISTORY:  1.  History of irregular heart beat described as tachycardia.  2.  History of Bell's palsy on left side of face.  3.   History of inner ear problem.  4.  Hypertension.  5.  Hypothyroidism.  6.  Dyslipidemia.  7.  Decreased hearing.   PAST SURGICAL HISTORY:  1.  Tonsillectomy in 1953.  2.  Appendectomy in 1955.  3.  D&C in 1965.  4.  Hysterectomy in 1971.   The patient denies any complications from anesthesia with any of the above  procedures.  Denies any blood products.   SOCIAL HISTORY:  The patient smokes every day.  Denies any alcohol use.  She  is widowed, has two grown children.  Primary care physician is Dr. Marcelino Duster, [place], [place].  Phone number is [phone].  The patient  lives in a two-story home with one step to usual entrance.  The patient is  retired.   FAMILY  HISTORY:  The patient's mother deceased with stroke at age 69.  Father deceased also with stroke at age 68, also had hypertension.  She has  one living brother who is age 16 and healthy.  She has one deceased brother,  age 27.  She has five living sisters whose health is good except for two  sisters with irregular heart beat.  She has four sisters who are deceased.  Health history is significant for irregular heart beat and stroke.   REVIEW OF SYSTEMS:  The patient had an upper respiratory infection in  February 2006; however, this has resolved.  She denies any chest pain, PND,  orthopnea.  She does suffer from shortness of breath with exertion.  She  wears dentures on top and bottom.  She wears glasses.  She denies any GI or  GU conditions.  She denies anemia.  She has irregular heart beat but no  coronary artery disease that is known.  Denies diabetes mellitus or any  other medical conditions.  She does not have a living will.  She has power  of attorney, Elayne Snare and Zadie Cleverly.   PHYSICAL EXAMINATION:  GENERAL:  The patient is well-developed, well-  nourished female in no acute distress.  The patient walks with an antalgic  gait.  She has varus deformities of both knees.  The patient's mood  and  affect are appropriate.  She talks easily with examiner.  The patient's  approximately height is 5 feet 3 inches, weight 180 pounds.  VITAL SIGNS:  Blood pressure 142/84, pulse 64, respiratory rate 16,  temperature 97.2.  CARDIAC:  Regular rate and rhythm.  No murmurs, rubs, or gallops noted.  CHEST:  Lungs are clear to auscultation bilaterally with no wheezing,  rhonchi, or rales.  ABDOMEN:  Soft, nontender. Bowel sounds x 4 quadrants.  HEENT:  Normocephalic and atraumatic without frontal maxillary sinus  tenderness to palpation.  She does have slight facial droop on the left  compared with the right.  Conjunctivae pink.  Sclerae nonicteric.  PERRLA.  EOMI.  No visible external ear deformity noted.  Nose:  Nasal septum  midline; however, there is some erythema in the left naris.  No polyps are  noted. Buccal mucosa is pink and moist.  Pharynx is without erythema or  exudate.  Tongue and uvula are midline.  NECK:  No lymphadenopathy.  Trachea is midline.  Carotids 2+ and without  bruits.  She has full range of motion of cervical spine without pain.  No  tenderness with palpation over the cervical spine.  BACK:  Nontender to palpation over thoracic and lumbar column.  BREASTS/GENITOURINARY/RECTAL:  Exams all deferred at this time.  NEUROLOGIC:  The patient is alert and oriented x 3.  Cranial nerves II-XII  grossly intact.  Lower extremity strength testing reveals 5/5 throughout  against resistance.  Deep tendon reflexes are 2+ bilaterally at the knees  and ankles.  UPPER EXTREMITIES:  Shoulders, upper arms, and forearms equal and symmetric  in size and shape.  The patient does have multiple degenerative changes of  the fingers on both hands.  Radial pulses are 2+ bilaterally.  LOWER EXTREMITIES:  The patient has full range of motion of both hips  without pain.  Left knee 0 to 106 degrees of flexion.  Palpation along the joint line reveals tenderness over the lateral joint lines with  palpation.  Valgus varus stressing reveals no laxity.  She does have approximately 10  degree varus deformity  on the left.  Anterior drawer is negative.  No  effusion and no edema is noted.  Marked crepitus with  passive range of  motion.  Right knee 0 to 100 degrees of flexion.  No effusion and no edema  noted.  Passive range of motion reveals a moderate amount of crepitus.  She  has no tenderness with palpation along the joint line.  She does have a 10  to 15 degree varus deformity.  Anterior drawer is negative. Valgus varus  stressing negative.  Lower extremities are not edematous bilaterally.  Dorsal pedal pulses are 2+ bilaterally. She has good sensation to light  touch in the toes.   IMPRESSION:  1.  End-stage osteoarthritis of bilateral knees, right knee with bone-on-      bone medial compartment and 7 to 10 degree varus deformity.  2.  History of irregular heart beat.  3.  History of Bell's palsy.  4.  Hypertension.  5.  Hypothyroidism.  6.  Dyslipidemia.  7.  Decreased hearing.  8.  History of inner ear problems.   PLAN:  The patient is to undergo a right total knee arthroplasty on November 06, 2003, by Dr. Sherlean Foot at [facility]. The patient will undergo all  preoperative labs and tests prior to surgery.  The patient will need to  receive both medical and cardiac clearance from her primary care physician,  Dr. Orvan Falconer, prior to surgery.      GC/MEDQ  D:  10/30/2004  T:  10/30/2004  Job:  [id number]

## 2010-11-30 NOTE — Discharge Summary (Signed)
[name], [name]               ACCOUNT NO.:  [id number]   MEDICAL RECORD NO.:  [id number]          PATIENT TYPE:  INP   LOCATION:  4731                         FACILITY:  [facility]   PHYSICIAN:  Doylene Canning. Ladona Ridgel, M.D.  DATE OF BIRTH:  Jan 23, 1931   DATE OF ADMISSION:  02/26/2005  DATE OF DISCHARGE:  02/27/2005                                 DISCHARGE SUMMARY   DISCHARGE DIAGNOSES:  1.  Discharging day #1 status post implantation of Medtronic [facility] VR      cardioverter defibrillator by Dr. Lewayne Bunting.  2.  History of anterior myocardial infarction with ventricular fibrillation      arrest Nov 27, 2004, status post stent to the left anterior descending      at that time.  3.  Residual coronary artery disease with left heart catheterization January 17, 2005.  The second obtuse marginal demonstrates inability for      percutaneous coronary intervention.  There are overlapping drug eluting      stents to the third obtuse marginal placed by Dr. Riley Kill January 17, 2005.      At that catheterization, ejection fraction 27%, 2 to 3+ mitral      regurgitation.  4.  Ischemic cardiomyopathy with class II congestive heart failure.   SECONDARY DIAGNOSES:  1.  History of tobacco habituation having quit February 2006.  2.  Hypertension.  3.  Dyslipidemia.  4.  Hypothyroidism.  5.  Narrow QRS.  6.  Anxiety.  7.  Status post right total knee arthroplasty April 2006.   PROCEDURE:  February 26, 2005, implantation of Medtronic [facility] VR  cardioverter defibrillator with defibrillator threshold study less than or  equal to 10 joules.  There was acceptable V pacing and sensing.  The device  has been interrogated after 16 hours of implantation, on the morning of  February 27, 2005, all values within normal limits and no changes made.   DISPOSITION:  [name] is ready for discharge, post procedure day  #1.  She is achieving 92% oxygen saturation on room air.  Hemoglobin is  12.1, hematocrit 36.9.   She is in sinus rhythm.  Blood pressure 127/76.  The  incision is sore but her pain is controlled with Tylenol.  She complains of  chronic fatigue ever since her heart attack on Nov 27, 2004.  This could  possibly be exacerbated by medication therapy.  She is currently on Toprol  XL 200 mg daily.  This has been increased from 100 mg daily.  She is also on  Lipitor 40 mg daily at bedtime, increased from 20 mg daily, although the  patient has not complained of myalgias, her main complaint is fatigue and  for the last week, a daily headache which seems to leave her fuzzy-headed.  Patient has been given a mobility sheet describing mobility to the left  upper extremity.  She is asked not to drive for the next week, nor lift any  heavy weights for the next 2 weeks.  She is asked to keep her incision dry  for the  next 7 days and to sponge-bathe until Tuesday, March 05, 2005.  For  pain management, Tylenol 325 mg 1 to 2 tablets every four six hours.   OTHER MEDICATIONS:  1.  Synthroid 125 mcg daily.  2.  Digitek 0.125 mg daily.  3.  Toprol XL 200 mg daily.  4.  Lisinopril 40 mg daily.  5.  Zoloft 100 mg daily.  6.  Inspra 25 mg daily.  7.  Enteric coated aspirin 325 mg daily.  8.  Plavix 75 mg daily.  9.  Lipitor 40 mg daily at bedtime.   PLAN FOR FOLLOW-UP:  1.  ICD clinic at [facility] [address] [place], Monday, March 11, 2005, at 9:15 in the morning.  2.  Follow-up with Dr. Andee Lineman, [facility] office [facility], Wednesday,      March 13, 2005, at 12:15 in the afternoon.  At that point, maybe      discussion of Toprol change to Coreg could be initiated and any other      issues that would impact on the patient's chronic fatigue of late      addressed.  3.  She will see Dr. Ladona Ridgel, Tuesday, June 04, 2005, 11 o'clock in the      morning, once again at [facility] office.   BRIEF HISTORY:  The patient is a  77 year old female who sustained anterior  myocardial infarction in May 2006.  She had ventricular fibrillation in the  [facility] en route.  She underwent successful  cardioversion.  She had just had relook catheterization on January 17, 2005, for  residual coronary artery disease.  The second obtuse marginal did not  demonstrate appropriate vessel size for PCI and overlapping DES stents were  placed in the third obtuse marginal by Dr. Riley Kill.  Patient has class II  congestive heart failure symptoms.  She was seen by Dr. Andee Lineman in [place],  [place].  Most recent echocardiogram shows ejection fraction 30 to  35% without pulmonary hypertension. Patient was found to have an enlarged  left atrium __________ dyssynchrony, however.  The patient has a narrow QRS  and normal PR interval.  Patient has no history of syncope.  The treatment  options were discussed with the patient by Dr. Ladona Ridgel who recommended  proceeding with implantable cardioverter defibrillator for primary  prevention.  The issue of biventricular pacing was raised but the patient  has normal QRS and no evidence of dyssynchrony.  Patient will be scheduled  for elective implantation.   HOSPITAL COURSE:  Patient presented February 26, 2005.  She underwent  successful implantation of Medtronic cardioverter defibrillator at that time  by Dr. Ladona Ridgel.  She has had no complaints in the post procedure period.  She  does have chronic fatigue which she was to have had onset at the time of her  myocardial infarction.  This could be  due to decreased left ventricular function and also perhaps function of her  medication regimen.  She will present back to Dr. Andee Lineman for an assessment  on this on March 13, 2005.  She sees the ICD clinic in two weeks.  Patient's incision is healing nicely at the time of discharge.      Maple Mirza, P.A.    ______________________________ Doylene Canning. Ladona Ridgel, M.D.    GM/MEDQ  D:   02/27/2005  T:  02/27/2005  Job:  [id number]   cc:   Marcelino Duster  [address] [place]  [place] [address]  Fax: [phone]   Learta Codding, M.D. [facility]  [address]  [address]  [place]  [place] [address]

## 2010-11-30 NOTE — H&P (Signed)
NAMECURTIS, [name]               ACCOUNT NO.:  [id number]   MEDICAL RECORD NO.:  [id number]          PATIENT TYPE:  INP   LOCATION:  NA                           FACILITY:  [facility]   PHYSICIAN:  Charlies Constable, M.D. [facility] DATE OF BIRTH:  27-Feb-1931   DATE OF ADMISSION:  11/27/2004  DATE OF DISCHARGE:                                HISTORY & PHYSICAL   PRIMARY CARE PHYSICIAN:  Marcelino Duster, M.D.   CLINICAL HISTORY:  [name] is a 77 years old and has had hypertension,  but no prior history of known heart disease. At approximately 6:30 this  morning she developed chest pain and shortness of breath, and called EMS and  was transported to [facility] emergency room. Shortly after arrival she  developed ventricular defibrillation requiring DC cardioversion. She then  was found to be in pulmonary edema and was intubated by Dr. Lillia Carmel at  [facility].  She was given 5000 units of heparin and she was transferred to  [place] by [facility].   PAST MEDICAL HISTORY:  Significant for bilateral total knees the last of  which was a month ago on April 24th by Dr. Sherlean Foot. She apparently has not  been on Coumadin presently. Her past medical history is also significant for  Bell's palsy, hypertension, and cardiac arrhythmia of some kind.   CURRENT MEDICATIONS:  1.  Synthroid 125 mcg daily.  2.  Hydrochlorothiazide 25 mg daily.  3.  Zoloft 50 mg daily.  4.  Toprol-XL 20 mg daily.  5.  Lipitor 40 mg daily.  6.  Lisinopril 40 mg daily.  7.  Premarin 0.5 mg daily.  8.  Aspirin 81 mg daily.  9.  Taztia-XT 240 mg daily.   For details of social history, family history, and review of systems, please  see complete note.   PHYSICAL EXAMINATION:  VITAL SIGNS: Blood pressure is 150/90, pulse 100 and  regular.  GENERAL: The patient was sedated, paralyzed, and on the ventilator.  HEENT: Pupils are mid position. The carotid pulses are equal and I can hear  no bruits. I could not tell about jugular venous  distention.  CHEST: Clear laterally without rales or rhonchi.  ABDOMEN: Soft. Bowel sounds were present. There is no hepatosplenomegaly.  There is no peripheral edema. The pedal pulses are equal.  SKIN: Warm and dry.  MUSCULOSKELETAL: No deformities.  NEUROLOGIC: She is flaccid after being paralyzed.   Her ECG showed acute anterior wall myocardial infarction with marked  anterior ST elevation in the anterior precordial leads. There were also Q-  waves in the inferior leads suggestive of an old inferior infarction.   IMPRESSION:  1.  Acute anterior wall myocardial infarction complicated by ventricular      fibrillation arrest and acute pulmonary edema requiring intubation.  2.  Hypertension.  3.  Hyperlipidemia.  4.  Degenerative joint disease, status post bilateral total knee replacement      the last of which was November 05, 2004.   DISPOSITION:  The patient is being prepared for emergency catheterization  and possible intervention. Will plan to give  600 mg of Plavix via NG tube  and give four Children's Aspirin via NG tube.      BB/MEDQ  D:  11/27/2004  T:  11/27/2004  Job:  [id number]

## 2010-11-30 NOTE — Op Note (Signed)
[name], [name]               ACCOUNT NO.:  [id number]   MEDICAL RECORD NO.:  [id number]          PATIENT TYPE:  INP   LOCATION:  2853                         FACILITY:  [facility]   PHYSICIAN:  Doylene Canning. Ladona Ridgel, M.D.  DATE OF BIRTH:  02-02-1931   DATE OF PROCEDURE:  02/26/2005  DATE OF DISCHARGE:                                 OPERATIVE REPORT   PROCEDURE PERFORMED:  Implantation of a single chamber ICD with a  defibrillation and device testing.   INDICATION:  Ischemic cardiomyopathy, nonsustained VT, class II congestive  heart failure status post MI, EF 30%.   INTRODUCTION:  The patient is a 77 year old with a history of coronary  disease, status post myocardial infarction with a history of severe LV  dysfunction, EF of 30%. She has class II heart failure symptoms. She has a  narrow QRS the short PR interval. She is on maximal medical therapy. She is  now referred for ICD implantation secondary to her ischemic cardiomyopathy.   PROCEDURE PERFORMED:  After informed consent was obtained, the patient is  taken to the diagnostic EP lab in fasting state. Usual preparation and  draping, intravenous fentanyl and Midazolam was given for sedation. 30 mL of  lidocaine was infiltrated in the left infraclavicular region. A  9 cm  incision was carried out over this region. Electrocautery utilized to  dissect down the fascial plane. 10 mL of contrast injected in the left upper  extremity venous system, demonstrated a patent left subclavian vein.  It was  subsequently punctured and the Medtronic model 6949 58-cm active fixation  defibrillation lead serial number [id number] was advanced by way of the  subclavian vein into the right ventricle. Mapping was carried out in the  right ventricle and at the final site along the apical RV septum.  The R-  waves measured 10 mV through the analyzer with a pace impedance of 994 ohms  and pacing threshold 0.9 volts at 0.5 milliseconds. 10 volts pacing did  not  stimulate the diaphragm. With the lead in satisfactory position and actively  fixed, it was secured to the subpectoralis fascia with a figure-of-eight  silk suture. In addition, the sewing sleeve was secured with  silk suture.  Electrocautery utilized to make subcutaneous pocket. Kanamycin irrigation  was utilized to irrigate the pocket. Electrocautery utilized to assure  hemostasis. The Medtronic model 7232 single chamber defibrillator serial  number [id number] was connected to the defibrillation lead and placed in the  subcutaneous pocket. Generator secured with silk suture. Additional  kanamycin irrigation utilized to irrigate the pocket and defibrillation  threshold testing carried out.   After the patient was more deeply sedated with fentanyl and Versed, VF was  induced with T-wave shock. A 10 joules shock was subsequently delivered and  terminated ventricular fibrillation restoring sinus rhythm. 5 minutes was  allowed to elapse and second DFT test carried out. Again VF was induced with  T-wave shock. This time a 6 joules shock was delivered which failed to  terminate VF. A subsequent 20 joules shock was then delivered terminating  ventricular fibrillation restoring sinus rhythm.  At this point no additional  defibrillation threshold testing was carried out and the incision was closed  with layer of 2-0 Vicryl followed by layer of 3-0 Vicryl followed by a layer  of 4-0 Vicryl.  Benzoin was painted on the skin, Steri-Strips were applied  and pressure dressing was placed. The patient was returned to her room in  satisfactory condition.   COMPLICATIONS:  There were no immediate procedure complications.   RESULTS:  This demonstrates successful implantation of a Medtronic single  chamber defibrillator in a patient with ischemic cardiomyopathy, congestive  heart failure, an EF of 30% status post myocardial infarction.           ______________________________  Doylene Canning. Ladona Ridgel,  M.D.     GWT/MEDQ  D:  02/26/2005  T:  02/26/2005  Job:  [id number]   cc:   Learta Codding, M.D. [facility]  [address]  [address]  [place]  [place] [address]

## 2010-11-30 NOTE — Discharge Summary (Signed)
NAMECORRETTA, MUNCE               ACCOUNT NO.:  [id number]   MEDICAL RECORD NO.:  [id number]          PATIENT TYPE:  INP   LOCATION:  5025                         FACILITY:  [facility]   PHYSICIAN:  Mila Homer. Sherlean Foot, M.D. DATE OF BIRTH:  June 16, 1931   DATE OF ADMISSION:  11/05/2004  DATE OF DISCHARGE:  11/08/2004                                 DISCHARGE SUMMARY   ADMISSION DIAGNOSES:  1.  End-stage osteoarthritis bilateral knees, right worse than left.  2.  Hypertension.  3.  Hypothyroidism.  4.  Dyslipidemia.  5.  Decreased hearing.  6.  History of inner ear problems.  7.  History of irregular heartbeat.  8.  History of Bell's palsy.   DISCHARGE DIAGNOSES:  1.  End-stage osteoarthritis bilateral knees, status post right total knee      arthroplasty.  2.  Acute blood loss anemia secondary to surgery.  3.  Leukocytosis.  4.  Constipation.  5.  Hypertension.  6.  Hypothyroidism.  7.  Dyslipidemia.  8.  Decreased hearing.  9.  History of inner ear problems.  10. History of irregular heartbeat.  11. History of Bell's palsy.   SURGICAL PROCEDURE:  On November 05, 2004, Ms. Splinter underwent a right total  knee arthroplasty by Dr. Mila Homer. Lucey, assisted by Arlyn Leak, P.A.-C.  She had a NexGen all poly patella, standard size, 32 mm diameter, 8.5 mm  thickness.  A femoral component size D right placed, with a fluted stem  tibial component size 4, an articular surface size yellow CD 12, mm height.   COMPLICATIONS:  None.   CONSULTS:  1.  Physical therapy and case management consult, November 06, 2004.  2.  Occupational therapy consult, November 07, 2004.   HISTORY OF PRESENT ILLNESS:  This 78 year old white female patient presents  to Dr. Sherlean Foot with bilateral knee pain, right worse than left.  It has now  been present for about 20 years, and it is an intermittent sensation that  occurs with ambulation.  She has difficulty moving from a sitting to a  standing position or going up or  down stairs.  She has failed conservative  treatment, and x-rays show bone-on-bone contact in the medial compartment.  Because of this, she is presenting for a right knee replacement.   HOSPITAL COURSE:  Ms. Cabanilla tolerated her surgical procedure well, without  immediate postoperative complications.  She was transferred to 5000.  While  on 5000, vitals remained stable.  She remained basically afebrile.  Hemoglobin ranged from 10.2 on November 06, 2004 to 9.5 on November 07, 2004, and  8.9 on November 08, 2004.  She was asymptomatic with that.  Right knee incision  is well approximated with staples.  There is some erythema about the middle  aspect of the incision, but there is no active drainage.  She has good range  of motion of the knee.  At this point, she is ready for discharge home after  she has a bowel movement.   DISCHARGE INSTRUCTIONS:   DIET:  She can resume her regular prehospitalization diet.   MEDICATIONS:  She may resume her prehospitalization medications, except no  aspirin while on Lovenox.   HOME MEDICATIONS:  1.  Synthroid 125 mcg p.o. q.a.m.  2.  Hydrochlorothiazide 25 mg p.o. q.a.m.  3.  Zoloft 50 mg p.o. q.a.m.  4.  Toprol XL 200 mg p.o. q.p.m.  5.  Lipitor 40 mg p.o. q.p.m.  6.  Lisinopril 40 mg p.o. q.a.m.  7.  Premarin 0.5 mg p.o. q.a.m.  8.  Taztia XT 240 mg p.o. q.a.m.   ADDITIONAL MEDICATIONS AT THIS TIME:  1.  Lovenox 40 mg subcutaneously daily, last dose Nov 19, 2004, #11, with no      refill.  2.  Percocet 5/325 mg, 1-2 tablets p.o. q.4 h. p.r.n. for pain, #50, with no      refill.  3.  She was suggested to take an iron supplement 1 tablet b.i.d. for one      month.   ACTIVITY:  She can be out of bed, weightbearing as tolerated on the right  leg with the use of a walker.  She is to have home CPM 0-100 degrees 6-8  hours a day, and home health PT per [facility].  Please see the  blue total knee discharge sheet for further activity  instructions.   WOUND CARE:  She may shower after no drainage from the wound for two days.  Please see the blue total knee discharge sheet for further wound care  instructions.   FOLLOWUP:  She needs to follow up with Dr. Sherlean Foot in our office on Nov 20, 2004, and is to call [phone] for that appointment.   LABORATORY DATA:  Chest x-ray done on October 30, 2004 showed mild bronchitic  changes.   White count on October 30, 2004 was 12.9.  On November 06, 2004, white count 9.6,  hemoglobin 10.2, hematocrit 28.9.  On November 07, 2004, white count 10.1,  hemoglobin 9.5, hematocrit 27.4.  On November 08, 2004, white count 11.7,  hemoglobin 8.9, hematocrit 26.6, and platelets 211.   On October 30, 2004, PT was 11.6.   On November 06, 2004, potassium 3.2, glucose 120, calcium 8.1.  On November 07, 2004, sodium 131, potassium 3.5, glucose 116, calcium 8.7.  All other  laboratory studies were within normal limits.      KED/MEDQ  D:  11/08/2004  T:  11/08/2004  Job:  [id number]   cc:   Marcelino Duster  [address] [place]  [place] [address]  Fax: [phone]

## 2010-11-30 NOTE — Op Note (Signed)
[name], [name]               ACCOUNT NO.:  [id number]   MEDICAL RECORD NO.:  [id number]          PATIENT TYPE:  INP   LOCATION:  2550                         FACILITY:  [facility]   PHYSICIAN:  Mila Homer. Sherlean Foot, M.D. DATE OF BIRTH:  1930-09-07   DATE OF PROCEDURE:  11/05/2004  DATE OF DISCHARGE:                                 OPERATIVE REPORT   PREOPERATIVE DIAGNOSIS:  Right knee osteoarthritis.   POSTOPERATIVE DIAGNOSIS:  Right knee osteoarthritis.   OPERATION PERFORMED:  Right total knee arthroplasty.   SURGEON:  Mila Homer. Sherlean Foot, M.D.   ASSISTANT:  Jamelle Rushing, P.A.   ANESTHESIA:  General.   INDICATIONS FOR PROCEDURE:  The patient is a 77 year old white female with  failure of conservative management for osteoarthritis of the right knee.  Informed consent was obtained.   DESCRIPTION OF PROCEDURE:  The patient was laid supine and administered  general anesthesia.  The right leg was prepped and draped in the usual  sterile fashion.  A midline incision was made with a #10 blade approximately  7 cm in length. A fresh blade was used to make a median parapatellar  arthrotomy. She had  a lot of synovial fluid as well as an extreme amount of  synovitis.  I did a complete synovectomy with a #10 blade and forceps.  I  then measured the patella at 22 mm and reamed down to 13 mm and used a 32 mm  template to drill three lug holes after removing excess bone.  With the  prosthetic trial in place, I recreated 22 mm of thickness.  I removed the  prosthetic trial and went into flesh and cut the ACL and PCL as well as  developed a medial sleeve of tissue down subperiosteally removing the deep  MCL off the medial crest of the tibia.  I then set the extramedullary tibial  guide to remove 2 mm of bone off the medial side making the cut  perpendicular to the long axis of the tibia.  I made this cut with a  sagittal saw protecting the ligaments with Z retractors, done by the P.A.  I  then  removed the cut surface of the bone and removed the extramedullary  guide.  I made an intramedullary drill  hole in the femur with the  intramedullary drill.  I set the intramedullary guide on 6 degree valgus  cut, tamped it down on the femur and pinned our distal femoral cutting block  into place.  I removed the intramedullary guide, made the distal femoral cut  with the sagittal saw.  The posterior condylar angle measured 3 degrees.  I  sized to size D and pinned to 3 degrees external rotation holes.  I placed a  four in one cutting block in the knee, screwed it into place and made the  anterior, posterior and chamfer cuts with a sagittal saw.  I removed the  guide and the excess pieces of bone.  I then placed a lamina spreader in the  knee and removed the posterior condylar osteophytes, ACL, PCL and medial and  lateral  menisci.  I then placed a 12 mm spacer block in the knee, got  flexion-extension gap balance and good alignment.  I then finished the femur  with a size D finishing block, size 4 tibial tray drill and keel.  I trialed  with a size 4 tibia, size B femur, size 12 insert, size 32 patella.  Had  excellent flexion, extension gap balance.  Drop and dangle was to 130  degrees.  The patella tracked very, very well.  I then removed the trial  components, copiously irrigated and cemented in the size 4 tibia first, size  B femur second, removed excess cement, snapped in the real polyethylene and  located the knee in extension.  I then cemented in the patella.  I removed  the excess cement.  When the cement was hardened, I let the tourniquet down.  I placed a Hemovac coming out superolaterally deep to the arthrotomy.  I  closed the arthrotomy with interrupted #1 Vicryl sutures, deep soft tissues  with interrupted 0 Vicryl sutures, subcuticular with 2-0 Vicryl stitch and  skin staples.  I dressed with Adaptic, 4 x 4, sterile Webril and a TED  stocking.   COMPLICATIONS:  None.    DRAINS:  One Hemovac.   ESTIMATED BLOOD LOSS:  300 cc.      SDL/MEDQ  D:  11/05/2004  T:  11/06/2004  Job:  [id number]

## 2010-11-30 NOTE — Cardiovascular Report (Signed)
[name]               ACCOUNT NO.:  [id number]   MEDICAL RECORD NO.:  [id number]          PATIENT TYPE:  OIB   LOCATION:  6532                         FACILITY:  [facility]   PHYSICIAN:  Arturo Morton. Riley Kill, M.D. [facility] OF BIRTH:  03/27/1931   DATE OF PROCEDURE:  01/18/2005  DATE OF DISCHARGE:                              CARDIAC CATHETERIZATION   CARDIOLOGIST:  1.  Arturo Morton. Riley Kill, M.D.  2.  Carole Binning, M.D.   INDICATIONS:  [name] is a 77 year old who previously presented with an  anterior wall infarction. She was treated with stenting of the LAD. She had  residual disease involving the second and third obtuse marginals.  The plan  was to treat the third obtuse marginal plus or minus the second obtuse  marginal.  She has had follow-up with Dr. Andee Lineman.  She is stabilized and  referred for cardiac catheterization.   PROCEDURE:  1.  Right left heart catheterization.  2.  Selective coronary arteriography.  3.  Selective left ventriculography.  4.  Percutaneous transluminal coronary angioplasty and stenting of the third      obtuse marginal.   DESCRIPTION OF PROCEDURE:  The patient was brought to the catheterization  laboratory, prepped and draped in usual fashion.  Through an anterior  puncture, the right femoral artery was entered.  Views of the left and right  coronaries were obtained in multiple angiographic projections.  This  demonstrated patency of the LAD at the stent site.  It also demonstrated  mild disease in the right coronary.  The disease in the circumflex was  unchanged.  I had called Dr. Andee Lineman to the laboratory and we reviewed the  studies.  This demonstrated about 70% narrowing of the OM-2 and a diffuse  90% narrowing of the OM-3.  The OM-2 particularly was unfavorable for  percutaneous intervention. Ventriculography was performed in the RAO  projection and also demonstrated moderate mitral regurgitation with an  ejection fraction of the  range of about 30%.  Based on this, there was also  the issue of right heart pressures.  I therefore elected to do a right heart  catheterization, and a 7-French sheath was placed in the right femoral vein.  Thermodilution Swan-Ganz catheter was then passed to the pulmonary artery.  The pulmonary capillary wedge pressure, right heart pressures were at best  moderate.  Based on this, it was Dr. Margarita Mail feeling that we should proceed  with percutaneous intervention of the third OM.  The patient was having  discomfort around the mouth which is what precipitated  her original  treatment.  I discussed this with the patient and preparations were made for  percutaneous intervention. Bivalirudin was given according to protocol, and  the ACT was prolonged in an appropriate fashion.  The patient was on chronic  Plavix therapy.  A high-torque floppy was taken down the vessel.  We  initially dilated with 2.25 mm balloon.  We initially thought we could pass  a 2.5-mm drug-eluting stent, but after placing the stent down in the vessel,  giving intracoronary nitroglycerin and carefully assessing the situation  it  was my feeling that the stent was clearly to big with this.  A 23 mm length  2.25 Mini-Vision non drug-eluting platform was placed in the vessel and  taken up to about 10 atmospheres. Following this, a second 12 mm overlapping  2.25-mm stent was taken down, telescoped into the previous stent, and  brought up to the ostium.  This was taken up to higher pressures at nearly  13-14 atmospheres. Following this, we used a Quantum Maverick 2.25 mm  balloon, and this was taken down into the distal aspect of the overlap  stents. This was taken up to about 14-15 atmospheres.  Multiple high  pressure inflations were then performed throughout the stent up to the  proximal takeoff leaving only about 1-2 mm of stent without post dilatation,  and the proximal portion of the stent was right at the bifurcation  into the  AV circumflex. With this, we completed the procedure.  Final angiographic  views were obtained. The patient had a fair amount of knee pain but this was  relieved after taking her off the table.   HEMODYNAMIC DATA:  1.  The right atrial pressure 7.  2.  Right ventricular pressure 39/7.  3.  Pulmonary artery pressure 136/12.  4.  Pulmonary capillary wedge pressure 13.  5.  LV pressure 146/19.  6.  Aortic pressure 150/64.  7.  No gradient pullback across aortic valve.   Angiographic data.  1.  Fick cardiac output 3.9 liters per minute.  2.  Fick cardiac index 2.13 liters per minute per m2.  3.  Aortic saturation 97%.  4.  Pulmonary saturation 72%.   DESCRIPTION OF PROCEDURE:  1.  The left main was free of critical disease.  2.  The left anterior descending artery courses to the apex.  After the      takeoff of the diagonal there is about 30% narrowing.  This was followed      by two stents which are appear to be widely patent.  There is a gap      between the stents.  The mid and distal vessel has mild luminal      irregularity but is free of critical disease.  3.  There is a small ramus intermedius or first marginal which is free of      critical disease.  4.  The second marginal branch demonstrates about 70% segmental plaquing at      the ostium.  This connects to the main AV circumflex in an area of 30%      eccentric plaquing. There is a third marginal branch that has diffuse      proximal 50-60% narrowing and then a long area of 90-95% stenosis.  The      AV circumflex provides the posterior wall.  5.  The right coronary artery provides a single PDA and 40% area of      narrowing in the proximal vessel.  6.  Ventriculography in the RAO projection reveals dyskinesis of the mid and      distal anterolateral wall and apical tip with estimate of the ejection      fraction in the 30-35% range.  Exact calculation was 27%.  There is     mitral regurgitation that was  thought to be 2-3+.   IMPRESSION:  1.  Moderate reduction in left ventricular function with wall motion      abnormality involving the left anterior descending territory and  probable secondary mitral regurgitation.  2.  Moderate pulmonary hypertension.  3.  Continued patency of the previous left anterior descending stents with a      dense wall motion abnormality in the territory the left anterior      descending.  4.  Moderately severe stenosis of the second obtuse marginal w5hich is      unfavorable for percutaneous intervention because of its ostial and      proximal segmental location.  5.  High-grade stenosis of the third obtuse marginal with successful      overlapping non DES stents.   DISPOSITION:  The vessel was clearly too small for placement of the DES.  I  discussed the case carefully with Dr. Andee Lineman.  The decision was made to  proceed with percutaneous intervention and try continue to stabilize her  medically.  She does have at least moderate pulmonary hypertension and  mitral regurgitation.  However, the LAD territory is gone and probably  nonviable. The right coronary artery is not critically narrowed.  We will  recommend close follow-up with Dr. Andee Lineman.       TDS/MEDQ  D:  01/18/2005  T:  01/19/2005  Job:  [id number]   cc:   Arturo Morton. Riley Kill, M.D. [facility]  [address]  [address]  [place]  [place] [address]   CV Laboratory   Marcelino Duster  [address] [place]  [place] [address]  Fax: [phone]   Learta Codding, M.D. [facility]  [address]  [address]  [place]  [place] [address]

## 2010-11-30 NOTE — Cardiovascular Report (Signed)
[name], [name]               ACCOUNT NO.:  [id number]   MEDICAL RECORD NO.:  [id number]          PATIENT TYPE:  INP   LOCATION:  NA                           FACILITY:  [facility]   PHYSICIAN:  Carole Binning, M.D. LHCDATE OF BIRTH:  04-15-1931   DATE OF PROCEDURE:  11/27/2004  DATE OF DISCHARGE:                              CARDIAC CATHETERIZATION   PROCEDURE PERFORMED:  1.  Left heart catheterization with coronary angiography, left      ventriculography and abdominal aortography.  2.  Percutaneous transluminal coronary angiography with thrombectomy,      followed by placement with drug-eluting stent in the mid left anterior      descending artery.  3.  Intra-aortic balloon pump insertion.   INDICATION:  The patient is a 77 year old woman who presented to [facility] this morning with an acute anterolateral wall myocardial  infarction.  She suffered ventricular fibrillation, requiring  defibrillation.  She was then intubated for a question of pulmonary edema.  She was transferred emergently to [facility].   CATHETERIZATION PROCEDURAL NOTE:  We initially placed a 6-French sheath in  the right femoral artery.  Native coronary angiography was performed with 6-  [place] JL-4 and JR-4 catheters.  Left ventriculography was performed with an  angled pigtail catheter.  After completion of percutaneous coronary  intervention, we performed abdominal aortography with an angled pigtail  catheter.  Contrast was Omnipaque.  There were no complications.   HEMODYNAMICS:  Left ventricular pressure 166/22, aortic pressure 152/98.  There was no aortic valve gradient on catheter pullback.   LEFT VENTRICULOGRAM:  There is severe akinesis of the distal anterior wall,  apical wall and inferior apical wall.  Ejection fraction was calculated at  34%.  There was no mitral regurgitation.   Abdominal aortogram reveals a 40% stenosis in the right renal artery and 60%  stenosis in the  left renal artery.  There is mild diffuse plaquing of the  infrarenal abdominal aorta.   CORONARY ANGIOGRAPHY:  The left main is short but normal.   The left anterior descending artery has a 30% stenosis in the ostium.  In  the mid LAD there is a 30%, followed by a long 99% stenosis with thrombus  and TIMI-2 flow beyond this. The distal LAD has a diffuse 50% stenosis  throughout.  The LAD gives rise to a small first diagonal branch, a smaller  than normal-sized second diagonal branch, and a small third diagonal branch.  There is a 50% stenosis in the ostium of the second diagonal branch.   The left circumflex is a relatively large vessel, giving rise to a small  first obtuse marginal and a normal- sized second obtuse marginal, normal-  sized third obtuse marginal, and a small fourth obtuse marginal branch.  There is a 50% stenosis in the mid circumflex.  The second obtuse marginal  has a tubular 75% stenosis, beginning at its origin and extending into the  proximal vessel.  The third obtuse marginal has a diffuse 95% stenosis in  the midbody.   The right coronary is a  dominant vessel.  There is a 50% stenosis in the  proximal vessel.  The distal right coronary gives rise to a normal-sized  posterior descending artery and a normal-sized posterolateral  branch.   IMPRESSIONS:  1.  Severely decreased left ventricular systolic function secondary to an      acute anterior wall myocardial infarction.  2.  Three-vessel coronary artery disease.  The culprit is the 99% stenosis      with thrombus and TIMI-2 flow in the mid left anterior descending.      There is also significant disease in the second and third obtuse      marginal branches and moderate disease in the left circumflex proper and      in the right coronary.   PLAN:  Percutaneous coronary intervention of the LAD.   PTCA PROCEDURAL NOTE:  We exchanged the 6-French sheath in the right femoral  artery for a 7-French sheath.   Heparin and Integrilin were administered per  protocol.  We used a 7-French JL-3.5 guiding catheter.  An Asahi soft  coronary guidewire was advanced under fluoroscopic guidance into the distal  LAD.  We then performed thrombectomy with a RIL aspiration catheter.  Two  passes were performed.  We then positioned a 3.0 x 20 mm Quantum balloon  across the diseased segment of vessel and inflated this balloon to 8  atmospheres.  Following this, we positioned a 3.0 x 33 mm Cypher drug-  eluting stent across the diseased segment of vessel.  This stent was  deployed at 11 atmospheres.  We then went back with a 3.0 x 20 mm Quantum  balloon and inflated this to 20 atmospheres in the distal aspect of the  stent and 22 atmospheres in the proximal aspect of the stent.  The patient  did have slow flow into the distal vessel.  Multiple doses of intracoronary  verapamil, adenosine and nitroglycerin were administered to improve coronary  perfusion.  Final angiographic images were obtained, showing patency of the  LAD with 0% residual stenosis at the stent site with some mild haziness in  the distal stent and slightly less than TIMI-3 flow into the distal vessel.   After completion of the percutaneous coronary intervention, we opted to  place an intra-aortic balloon pump for hemodynamic support and to augment  coronary perfusion.  We placed an 8-French 34 cc intra-aortic balloon pump  via the right femoral artery.   COMPLICATIONS:  None.   RESULTS:  Successful PTCA thrombectomy and placement of a drug-eluting stent  in the mid left anterior descending artery.  A long 99% stenosis with  thrombus and TIMI-2 flow was reduced to 0% residual with slightly less than  TIMI-3 flow.   PLAN:  Integrilin will be continued for at least 24 hours.  The patient will  be maintained on intra-aortic balloon pump support for approximately 24 hours.  She will be treated with Plavix for recommended 1 year. Regarding   residual disease in the circumflex, would recommend consideration of  elective percutaneous coronary intervention of the third plus or minus the  second obtuse marginal branch in 4-6 weeks after the patient has had  adequate time to recover from her acute infarct.      MWP/MEDQ  D:  11/27/2004  T:  11/27/2004  Job:  [id number]   cc:   Marcelino Duster  [address] [place]  [place] [address]  Fax: [phone]   Arvilla Meres, M.D. [facility]

## 2010-12-11 ENCOUNTER — Ambulatory Visit (INDEPENDENT_AMBULATORY_CARE_PROVIDER_SITE_OTHER): Payer: Medicare Other | Admitting: Cardiology

## 2010-12-11 ENCOUNTER — Encounter: Payer: Self-pay | Admitting: Cardiology

## 2010-12-11 VITALS — BP 129/77 | HR 59 | Ht 62.0 in | Wt 144.0 lb

## 2010-12-11 DIAGNOSIS — I4901 Ventricular fibrillation: Secondary | ICD-10-CM

## 2010-12-11 DIAGNOSIS — I2589 Other forms of chronic ischemic heart disease: Secondary | ICD-10-CM

## 2010-12-11 DIAGNOSIS — M4800 Spinal stenosis, site unspecified: Secondary | ICD-10-CM

## 2010-12-11 NOTE — Progress Notes (Signed)
HPI The patient has a history of coronary artery disease. 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 In March of this year she had an acute lateral ST segment elevation myocardial infarction. She received a stent to the diagonal vessel of the LAD followed by a drug-eluting stent to the RCA several days later. Had a decline in her ejection fraction to 35-40%. During the last office visit the patient was scheduled for myelogram for spinal stenosis. Unfortunately her Plavix was discontinued just recently after her stent placement. We immediately restarted her medications and she has had no further problems. She reports no recurrent chest pain and the patient did not have any issues with subacute stent thrombosis. I told the patient that we would not consider giving any type of clearance for back surgery at least until September and probably even want to wait until a full year of dual antiplatelet therapy given her multiple stents that were placed.  The patient does have buttock pain and some lower back pain. She has known spinal stenosis and collapsed vertebrae. She appears also problems with motor as well as sensation and proprioception. She is now walking with a walker. Her pain however is well controlled with Tylenol. At this point there are no further plans in the immediate future to proceed with either myelogram her surgery. The patient also states that she will followup with Dr. Trey Sailors at [facility] and consider surgery in the future here.  From a cardiac standpoint the patient is doing well. She denies any chest pain shortness of breath orthopnea or PND she has no palpitations or syncope.  Allergies  Allergen Reactions  . Cephalexin   . Doxazosin Mesylate   . Neomycin   . Nsaids   . Oxycodone Hcl   . Povidone-Iodine   . Prednisone     Current Outpatient Prescriptions on File Prior to Visit  Medication Sig Dispense Refill  .  aspirin 325 MG tablet Take 325 mg by mouth daily.        Marland Kitchen atorvastatin (LIPITOR) 40 MG tablet Take 40 mg by mouth daily.        . Calcium Carbonate-Vitamin D (CALTRATE 600+D) 600-400 MG-UNIT per tablet Take 2 tablets by mouth daily.       . chlorthalidone (HYGROTON) 25 MG tablet Take 25 mg by mouth daily.        . cloNIDine (CATAPRES) 0.1 MG tablet Take 0.1 mg by mouth daily.        . clopidogrel (PLAVIX) 75 MG tablet Take 75 mg by mouth daily.        . digoxin (LANOXIN) 0.125 MG tablet Take 125 mcg by mouth daily.        Marland Kitchen eplerenone (INSPRA) 25 MG tablet Take 25 mg by mouth daily.        Marland Kitchen levothyroxine (SYNTHROID) 112 MCG tablet Take 112 mcg by mouth daily.        Marland Kitchen lisinopril (PRINIVIL,ZESTRIL) 40 MG tablet Take 40 mg by mouth daily.        . metoprolol (TOPROL-XL) 100 MG 24 hr tablet Take 1/2 tab (50mg ) twice a day      . Omega-3 Fatty Acids (FISH OIL) 1000 MG CAPS Take 1 capsule by mouth every morning.       . sertraline (ZOLOFT) 100 MG tablet Take 1/2 tab (50mg ) at bedtime       . DISCONTD: estrogens, conjugated, (PREMARIN) 0.45 MG tablet Take 0.45 mg by mouth  daily. Take daily for 21 days then do not take for 7 days.       Marland Kitchen DISCONTD: traMADol (ULTRAM) 50 MG tablet Take 50 mg by mouth every 8 (eight) hours as needed.          Past Medical History  Diagnosis Date  . Unspecified essential hypertension   . Chronic systolic heart failure   . Automatic implantable cardiac defibrillator in situ   . Other specified forms of chronic ischemic heart disease   . Chronic airway obstruction, not elsewhere classified   . Obstructive sleep apnea   . Hyperlipidemia   . Hypothyroidism   . Depression   . Osteopenia   . CAD (coronary artery disease)     status post prior stenting of the third obtuse marginal with drug eluting stent placement within the first diagonal and right coronary artery   . B12 deficiency   . Spinal stenosis     Past Surgical History  Procedure Date  . Abdominal  hysterectomy   . Appendectomy   . Tonsillectomy   . Total left knee replacement   . Icd implant     [employer]     Family History  Problem Relation Age of Onset  . Stroke Mother   . Stroke Father   . Heart attack Sister   . Heart attack Brother   . Stroke Sister     History   Social History  . Marital Status: Widowed    Spouse Name: N/A    Number of Children: N/A  . Years of Education: N/A   Occupational History  . RETIRED    Social History Main Topics  . Smoking status: Former Smoker    Quit date: 09/13/2010  . Smokeless tobacco: Never Used  . Alcohol Use: No  . Drug Use: No  . Sexually Active: Not on file   Other Topics Concern  . Not on file   Social History Narrative  . No narrative on file   [id number] positives as outlined above. The remainder of the 18  point review of systems is negative  PHYSICAL EXAM BP 129/77  Pulse 59  Ht 5\' 2"  (1.575 m)  Wt 144 lb (65.318 kg)  BMI 26.34 kg/m2 General: Well-developed, well-nourished in no distress Head: Normocephalic and atraumatic Eyes:PERRLA/EOMI intact, conjunctiva and lids normal Ears: No deformity or lesions Mouth:normal dentition, normal posterior pharynx Neck: Supple, no JVD.  No masses, thyromegaly or abnormal cervical nodes Lungs: Normal breath sounds bilaterally without wheezing.  Normal percussion Cardiac: regular rate and rhythm with normal S1 and S2, no S3 or S4.  PMI is normal.  No pathological murmurs Abdomen: Normal bowel sounds, abdomen is soft and nontender without masses, organomegaly or hernias noted.  No hepatosplenomegaly MSK: Difficulty with gait requiring a walker. Also strength and lower extremities. Vascular: Pulse is normal in all 4 extremities Extremities: No peripheral pitting edema Neurologic: Alert and oriented x 3 Skin: Intact without lesions or rashes Lymphatics: No significant adenopathy Psychologic: Normal affect   ECG:NA  ASSESSMENT AND PLAN

## 2010-12-11 NOTE — Assessment & Plan Note (Signed)
ischemic cardiomyopathy status post acute myocardial infarction with stent placement to the LAD diagonal and staged procedure to the RCA. Plavix inadvertently discontinued but restarted her last office visit. The patient cannot have any type of noncardiac surgery until at least September of 2012 and probably want to even extend that an additional 6 months. I've asked the patient in the meanwhile to see Dr. Channing Mutters and we will further coordinate in the future when the appropriate time is for her to have surgery done in the setting of multiple stents as well as LV dysfunction. Chronic systolic heart failure: With decreasing ejection fraction: 35-40%.

## 2010-12-11 NOTE — Patient Instructions (Signed)
Follow up as scheduled. Your physician recommends that you continue on your current medications as directed. Please refer to the Current Medication list given to you today. Schedule an appointment with Dr. Trey Sailors as discussed with Dr. Earnestine Leys.

## 2010-12-11 NOTE — Assessment & Plan Note (Signed)
Status post sudden cardiac death secondary to ventricular fibrillation Status post ICD implant with device lead: Patient carries a magnet with her at all times. The patient has a revised lead. If surgery is planned in the future device company will need to be notified.

## 2010-12-11 NOTE — Assessment & Plan Note (Signed)
Myelogram canceled your last office visit due to a recent stent placement.

## 2011-01-03 ENCOUNTER — Ambulatory Visit (INDEPENDENT_AMBULATORY_CARE_PROVIDER_SITE_OTHER): Payer: Medicare Other | Admitting: *Deleted

## 2011-01-03 ENCOUNTER — Other Ambulatory Visit: Payer: Self-pay | Admitting: Internal Medicine

## 2011-01-03 DIAGNOSIS — I428 Other cardiomyopathies: Secondary | ICD-10-CM

## 2011-01-03 DIAGNOSIS — I4901 Ventricular fibrillation: Secondary | ICD-10-CM

## 2011-01-03 DIAGNOSIS — I5022 Chronic systolic (congestive) heart failure: Secondary | ICD-10-CM

## 2011-01-15 ENCOUNTER — Encounter: Payer: Self-pay | Admitting: *Deleted

## 2011-01-15 NOTE — Progress Notes (Signed)
icd remote check  

## 2011-03-29 ENCOUNTER — Encounter: Payer: Self-pay | Admitting: Cardiology

## 2011-04-02 ENCOUNTER — Encounter: Payer: Self-pay | Admitting: Cardiology

## 2011-04-02 ENCOUNTER — Ambulatory Visit (INDEPENDENT_AMBULATORY_CARE_PROVIDER_SITE_OTHER): Payer: Medicare Other | Admitting: Cardiology

## 2011-04-02 VITALS — BP 123/74 | HR 52 | Ht 62.0 in | Wt 146.0 lb

## 2011-04-02 DIAGNOSIS — I2589 Other forms of chronic ischemic heart disease: Secondary | ICD-10-CM

## 2011-04-02 DIAGNOSIS — I5022 Chronic systolic (congestive) heart failure: Secondary | ICD-10-CM

## 2011-04-02 DIAGNOSIS — Z79899 Other long term (current) drug therapy: Secondary | ICD-10-CM

## 2011-04-02 DIAGNOSIS — Z95810 Presence of automatic (implantable) cardiac defibrillator: Secondary | ICD-10-CM

## 2011-04-02 DIAGNOSIS — I428 Other cardiomyopathies: Secondary | ICD-10-CM

## 2011-04-02 DIAGNOSIS — I10 Essential (primary) hypertension: Secondary | ICD-10-CM

## 2011-04-02 DIAGNOSIS — M4800 Spinal stenosis, site unspecified: Secondary | ICD-10-CM

## 2011-04-02 DIAGNOSIS — I2510 Atherosclerotic heart disease of native coronary artery without angina pectoris: Secondary | ICD-10-CM

## 2011-04-02 NOTE — Assessment & Plan Note (Signed)
The patient status post ICD implantation. She's stable from a cardiac perspective and has no heart failure symptoms.

## 2011-04-02 NOTE — Progress Notes (Signed)
HPI The patient has a history of coronary artery disease. 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 In March of this year she had an acute lateral ST segment elevation myocardial infarction. She received a stent to the diagonal vessel of the LAD followed by a drug-eluting stent to the RCA several days later. Had a decline in her ejection fraction to 35-40%.  During the last office visit the patient was scheduled for myelogram for spinal stenosis. Unfortunately her Plavix was discontinued just recently after her stent placement. We immediately restarted her medications and she has had no further problems. She reports no recurrent chest pain and the patient did not have any issues with subacute stent thrombosis. I told the patient that we would not consider giving any type of clearance for back surgery at least until September and probably even want to wait until a full year of dual antiplatelet therapy given her multiple stents that were placed.  The patient does have buttock pain and some lower back pain. She has known spinal stenosis and collapsed vertebrae. She appears also problems with motor as well as sensation and proprioception. She is now walking with a walker. Her pain however is well controlled with Tylenol. At this point there are no further plans in the immediate future to proceed with either myelogram her surgery. The patient also states that she will followup with Dr. Trey Sailors at [facility] and consider surgery in the future here.  From a cardiac standpoint the patient is doing well. She denies any chest pain shortness of breath orthopnea or PND she has no palpitations or syncope. The patient today was under the impression that she could come off Plavix as outlined above I told the patient that this would depend on the urgency of her surgery. I'm not sure why she has not made an appointment Dr. Channing Mutters yet and she has requested for [place] to do  so  Allergies  Allergen Reactions  . Cephalexin   . Doxazosin Mesylate   . Neomycin   . Nsaids   . Oxycodone Hcl   . Povidone-Iodine   . Prednisone     Current Outpatient Prescriptions on File Prior to Visit  Medication Sig Dispense Refill  . aspirin 325 MG tablet Take 325 mg by mouth daily.        Marland Kitchen atorvastatin (LIPITOR) 40 MG tablet Take 40 mg by mouth daily.        . Calcium Carbonate-Vitamin D (CALTRATE 600+D) 600-400 MG-UNIT per tablet Take 2 tablets by mouth daily.       . chlorthalidone (HYGROTON) 25 MG tablet Take 25 mg by mouth daily.        . cloNIDine (CATAPRES) 0.1 MG tablet Take 0.1 mg by mouth daily.        . clopidogrel (PLAVIX) 75 MG tablet Take 75 mg by mouth daily.        . digoxin (LANOXIN) 0.125 MG tablet Take 125 mcg by mouth daily.        Marland Kitchen eplerenone (INSPRA) 25 MG tablet Take 25 mg by mouth daily.        Marland Kitchen estradiol (ESTRACE) 1 MG tablet Take 1 mg by mouth at bedtime.        Marland Kitchen levothyroxine (SYNTHROID) 112 MCG tablet Take 112 mcg by mouth daily.        Marland Kitchen lisinopril (PRINIVIL,ZESTRIL) 40 MG tablet Take 40 mg by mouth daily.        Marland Kitchen  metoprolol (TOPROL-XL) 100 MG 24 hr tablet Take 1/2 tab (50mg ) twice a day      . Omega-3 Fatty Acids (FISH OIL) 1000 MG CAPS Take 1 capsule by mouth every morning.       . sertraline (ZOLOFT) 100 MG tablet Take 1/2 tab (50mg ) at bedtime         Past Medical History  Diagnosis Date  . Unspecified essential hypertension   . Chronic systolic heart failure   . Automatic implantable cardiac defibrillator in situ   . Other specified forms of chronic ischemic heart disease   . Chronic airway obstruction, not elsewhere classified   . Obstructive sleep apnea   . Hyperlipidemia   . Hypothyroidism   . Depression   . Osteopenia   . CAD (coronary artery disease)     status post prior stenting of the third obtuse marginal with drug eluting stent placement within the first diagonal and right coronary artery   . B12 deficiency   .  Spinal stenosis     Past Surgical History  Procedure Date  . Abdominal hysterectomy   . Appendectomy   . Tonsillectomy   . Total left knee replacement   . Icd implant     [employer]     Family History  Problem Relation Age of Onset  . Stroke Mother   . Stroke Father   . Heart attack Sister   . Heart attack Brother   . Stroke Sister     History   Social History  . Marital Status: Widowed    Spouse Name: N/A    Number of Children: N/A  . Years of Education: N/A   Occupational History  . RETIRED    Social History Main Topics  . Smoking status: Former Smoker -- 0.5 packs/day for 25 years    Types: Cigarettes    Quit date: 09/13/2010  . Smokeless tobacco: Never Used  . Alcohol Use: No  . Drug Use: No  . Sexually Active: Not on file   Other Topics Concern  . Not on file   Social History Narrative  . No narrative on file   [id number] positives as outlined above. The remainder of the 18  point review of systems is negative  PHYSICAL EXAM BP 123/74  Pulse 52  Ht 5\' 2"  (1.575 m)  Wt 146 lb (66.225 kg)  BMI 26.70 kg/m2 General: Well-developed, well-nourished in no distress  Head: Normocephalic and atraumatic  Eyes:PERRLA/EOMI intact, conjunctiva and lids normal  Ears: No deformity or lesions  Mouth:normal dentition, normal posterior pharynx  Neck: Supple, no JVD. No masses, thyromegaly or abnormal cervical nodes  Lungs: Normal breath sounds bilaterally without wheezing. Normal percussion  Cardiac: regular rate and rhythm with normal S1 and S2, no S3 or S4. PMI is normal. No pathological murmurs  Abdomen: Normal bowel sounds, abdomen is soft and nontender without masses, organomegaly or hernias noted. No hepatosplenomegaly  MSK: Difficulty with gait requiring a walker. Decreased strength in lower extremities  Vascular: Pulse is normal in all 4 extremities  Extremities: No peripheral pitting edema  Neurologic: Alert and oriented x 3  Skin: Intact without  lesions or rashes  Lymphatics: No significant adenopathy  Psychologic: Normal affect   ECG: Sinus bradycardia with first degree AV block and inferior wall infarct pattern old her age undetermined  ASSESSMENT AND PLAN

## 2011-04-02 NOTE — Assessment & Plan Note (Signed)
The patient follows up with Dr. Ladona Ridgel. ICD will need to be disarmed at the time of future surgery.

## 2011-04-02 NOTE — Patient Instructions (Signed)
Your physician recommends that you go to the [facility] for lab work for FLP & LFT (cholesterol).  Reminder:  Nothing to eat or drink after 12 midnight prior to labs. If the results of your test are normal or stable, you will receive a letter.  If they are abnormal, the nurse will contact you by phone. Referral to Dr. Channing Mutters  Follow up in  3 months - see above

## 2011-04-02 NOTE — Assessment & Plan Note (Signed)
Blood pressure well controlled

## 2011-04-02 NOTE — Assessment & Plan Note (Signed)
We would prefer for the patient to delay her surgery until March 2013, but his surgeries contemplated earlier the patient can be briefly taken off Plavix but this will be in conjunction after talking with Dr. Trey Sailors

## 2011-04-04 ENCOUNTER — Encounter: Payer: Self-pay | Admitting: Internal Medicine

## 2011-04-05 ENCOUNTER — Encounter: Payer: Self-pay | Admitting: Internal Medicine

## 2011-04-05 ENCOUNTER — Ambulatory Visit (INDEPENDENT_AMBULATORY_CARE_PROVIDER_SITE_OTHER): Payer: Medicare Other | Admitting: Internal Medicine

## 2011-04-05 DIAGNOSIS — I5022 Chronic systolic (congestive) heart failure: Secondary | ICD-10-CM

## 2011-04-05 DIAGNOSIS — I2589 Other forms of chronic ischemic heart disease: Secondary | ICD-10-CM

## 2011-04-05 DIAGNOSIS — I4901 Ventricular fibrillation: Secondary | ICD-10-CM

## 2011-04-05 DIAGNOSIS — Z95810 Presence of automatic (implantable) cardiac defibrillator: Secondary | ICD-10-CM

## 2011-04-05 NOTE — Patient Instructions (Signed)
Your physician recommends that you schedule a follow-up appointment in: 1 year  

## 2011-04-05 NOTE — Assessment & Plan Note (Signed)
Her device is working normally. We'll plan to recheck in several months. 

## 2011-04-05 NOTE — Assessment & Plan Note (Signed)
Symptoms are currently class II. She will continue her current medical therapy and maintain a low-sodium diet.

## 2011-04-05 NOTE — Assessment & Plan Note (Signed)
She denies anginal symptoms. She'll continue her current medical therapy. Her exercise ability is limited with the pain that she is experiencing in her back and hips.

## 2011-04-05 NOTE — Progress Notes (Signed)
HPI [name] returns today for followup. She is a 77 year old woman with a history of chronic systolic heart failure and an ischemic cardiomyopathy status post ICD implantation. She denies any recent ICD shocks. She denies chest pain, shortness of breath, or peripheral edema. Her main complaint is regarding pain in her back. Apparently she has a collapsed vertebra and is undergoing consideration for what sounds like vertebral repair (questionable vertebral plasty). Allergies  Allergen Reactions  . Augmentin Other (See Comments)    VOMITING  . Cephalexin   . Doxazosin Mesylate     INCONTINENCE   . Neomycin   . Nsaids   . Oxycodone Hcl   . Povidone-Iodine   . Prednisone   . Tape     RASH      Current Outpatient Prescriptions  Medication Sig Dispense Refill  . acetaminophen (TYLENOL) 650 MG CR tablet Take 650 mg by mouth every 8 (eight) hours as needed.        Marland Kitchen aspirin 325 MG tablet Take 325 mg by mouth daily.        Marland Kitchen atorvastatin (LIPITOR) 40 MG tablet Take 40 mg by mouth daily.        . Calcium Carbonate-Vitamin D (CALTRATE 600+D) 600-400 MG-UNIT per tablet Take 2 tablets by mouth daily.       . chlorthalidone (HYGROTON) 25 MG tablet Take 25 mg by mouth daily.        . cloNIDine (CATAPRES) 0.1 MG tablet Take 0.1 mg by mouth daily.        . clopidogrel (PLAVIX) 75 MG tablet Take 75 mg by mouth daily.        . digoxin (LANOXIN) 0.125 MG tablet Take 125 mcg by mouth daily.        Marland Kitchen eplerenone (INSPRA) 25 MG tablet Take 25 mg by mouth daily.        Marland Kitchen estradiol (ESTRACE) 1 MG tablet Take 1 mg by mouth at bedtime.        Marland Kitchen levothyroxine (SYNTHROID) 112 MCG tablet Take 112 mcg by mouth daily.        Marland Kitchen lisinopril (PRINIVIL,ZESTRIL) 40 MG tablet Take 40 mg by mouth daily.        . metoprolol (TOPROL-XL) 100 MG 24 hr tablet Take 1/2 tab (50mg ) twice a day      . Omega-3 Fatty Acids (FISH OIL) 1000 MG CAPS Take 1 capsule by mouth every morning.       . sertraline (ZOLOFT) 100 MG tablet  Take 1/2 tab (50mg ) at bedtime          Past Medical History  Diagnosis Date  . Unspecified essential hypertension   . Chronic systolic heart failure   . Automatic implantable cardiac defibrillator in situ   . Other specified forms of chronic ischemic heart disease   . Chronic airway obstruction, not elsewhere classified   . Obstructive sleep apnea   . Hyperlipidemia   . Hypothyroidism   . Depression   . Osteopenia   . CAD (coronary artery disease)     status post prior stenting of the third obtuse marginal with drug eluting stent placement within the first diagonal and right coronary artery   . B12 deficiency   . Spinal stenosis     ROS:   All systems reviewed and negative except as noted in the HPI.   Past Surgical History  Procedure Date  . Abdominal hysterectomy   . Appendectomy   . Tonsillectomy   . Total left knee  replacement   . Icd implant     [employer]      Family History  Problem Relation Age of Onset  . Stroke Mother   . Stroke Father   . Heart attack Sister   . Heart attack Brother   . Stroke Sister      History   Social History  . Marital Status: Widowed    Spouse Name: N/A    Number of Children: N/A  . Years of Education: N/A   Occupational History  . RETIRED    Social History Main Topics  . Smoking status: Former Smoker -- 0.5 packs/day for 25 years    Types: Cigarettes    Quit date: 09/13/2010  . Smokeless tobacco: Never Used  . Alcohol Use: No  . Drug Use: No  . Sexually Active: Not on file   Other Topics Concern  . Not on file   Social History Narrative  . No narrative on file     BP 142/83  Pulse 64  Resp 18  Ht 5\' 3"  (1.6 m)  Wt 141 lb (63.957 kg)  BMI 24.98 kg/m2  SpO2 97%  Physical Exam:  Well appearing elderly woman, NAD HEENT: Unremarkable Neck:  No JVD, no thyromegally Lymphatics:  No adenopathy Back:  No CVA tenderness Lungs:  Clear with no wheezes, rales, or rhonchi. Well-healed ICD incision HEART:   Regular rate rhythm, no murmurs, no rubs, no clicks Abd:  soft, positive bowel sounds, no organomegally, no rebound, no guarding Ext:  2 plus pulses, no edema, no cyanosis, no clubbing Skin:  No rashes no nodules Neuro:  CN II through XII intact, motor grossly intact  DEVICE  Normal device function.  See PaceArt for details.   Assess/Plan:

## 2011-04-12 ENCOUNTER — Other Ambulatory Visit: Payer: Self-pay | Admitting: *Deleted

## 2011-04-12 DIAGNOSIS — M4800 Spinal stenosis, site unspecified: Secondary | ICD-10-CM

## 2011-07-02 ENCOUNTER — Ambulatory Visit (INDEPENDENT_AMBULATORY_CARE_PROVIDER_SITE_OTHER): Payer: Medicare Other | Admitting: Cardiology

## 2011-07-02 ENCOUNTER — Encounter: Payer: Self-pay | Admitting: Cardiology

## 2011-07-02 VITALS — BP 121/73 | HR 69 | Ht 63.0 in | Wt 147.0 lb

## 2011-07-02 DIAGNOSIS — Z01810 Encounter for preprocedural cardiovascular examination: Secondary | ICD-10-CM | POA: Insufficient documentation

## 2011-07-02 DIAGNOSIS — Z95810 Presence of automatic (implantable) cardiac defibrillator: Secondary | ICD-10-CM

## 2011-07-02 DIAGNOSIS — I2589 Other forms of chronic ischemic heart disease: Secondary | ICD-10-CM

## 2011-07-02 DIAGNOSIS — I428 Other cardiomyopathies: Secondary | ICD-10-CM

## 2011-07-02 DIAGNOSIS — I5022 Chronic systolic (congestive) heart failure: Secondary | ICD-10-CM

## 2011-07-02 DIAGNOSIS — I4901 Ventricular fibrillation: Secondary | ICD-10-CM

## 2011-07-02 NOTE — Patient Instructions (Signed)
Follow up as scheduled. Your physician recommends that you continue on your current medications as directed. Please refer to the Current Medication list given to you today. Your physician has requested that you have an echocardiogram. Echocardiography is a painless test that uses sound waves to create images of your heart. It provides your doctor with information about the size and shape of your heart and how well your heart's chambers and valves are working. This procedure takes approximately one hour. There are no restrictions for this procedure. If the results of your test are normal or stable, you will receive a letter. If they are abnormal, the nurse will contact you by phone.

## 2011-07-02 NOTE — Assessment & Plan Note (Signed)
Followed by Dr. Taylor 

## 2011-07-02 NOTE — Assessment & Plan Note (Signed)
Given the patient's severe back pain and progressive spinal stenosis the patient wants to proceed with back surgery as soon as possible. We have told her however to take the risk and benefits into account of stopping her Plavix. I have recommended for her not to proceed with surgical he earlier done March 2013. She will see Dr. Channing Mutters in January and we will perform an echocardiogram later that month to reassess her ejection fraction.

## 2011-07-02 NOTE — Assessment & Plan Note (Signed)
No recurrent chest pain. Status post multiple stent placement requiring lifelong Plavix therapy, but we will anticipate to stop Plavix briefly at the time of her surgery after March 2012.

## 2011-07-02 NOTE — Progress Notes (Signed)
Marie Bottoms, MD, [facility]IM Board Certified in Adult Cardiovascular Medicine,Internal Medicine and Critical Care Medicine    CC: Followup patient with coronary artery disease in anticipation of upcoming surgery  HPI:  The patient is a 77 year old female with history of coronary artery disease, ischemic cardiomyopathy status post ICD placement for ventricular fibrillation and LV dysfunction with an ejection fraction of 35-40%. The patient has had multiple stents placed, initially she had 3 stents placed in 2006 and more recently in March of 2012 had a stent to the LAD and to the RCA. Unfortunately she has spinal stenosis he needs back surgery. We have placed a surgery on hold until one year after her last stent placements. The patient is able to control her back pain with Tylenol. From a cardiac standpoint she's been doing well. She reports no chest pain and has no heart failure symptoms. She also has not required any admissions for heart failure. Her ICD is monitored by Dr. Ladona Ridgel. She does complain of significant back pain and right buttock pain as well as pain down the right leg. She denies any palpitations, presyncope or syncope or any defibrillator discharges.     PMH: reviewed and listed in Problem List in Electronic Records (and see below) Past Medical History  Diagnosis Date  . Unspecified essential hypertension   . Chronic systolic heart failure   . Automatic implantable cardiac defibrillator in situ   . Other specified forms of chronic ischemic heart disease   . Chronic airway obstruction, not elsewhere classified   . Obstructive sleep apnea   . Hyperlipidemia   . Hypothyroidism   . Depression   . Osteopenia   . CAD (coronary artery disease)     status post prior stenting of the third obtuse marginal with drug eluting stent placement within the first diagonal and right coronary artery   . B12 deficiency   . Spinal stenosis    Past Surgical History  Procedure Date  .  Abdominal hysterectomy   . Appendectomy   . Tonsillectomy   . Total left knee replacement   . Icd implant     MEDTRONIC      Allergies/SH/FHX : available in Electronic Records for review and reviewed today  Medications: Current Outpatient Prescriptions  Medication Sig Dispense Refill  . acetaminophen (TYLENOL) 650 MG CR tablet Take 650 mg by mouth every 8 (eight) hours as needed.        Marland Kitchen aspirin 325 MG tablet Take 325 mg by mouth daily.        Marland Kitchen atorvastatin (LIPITOR) 40 MG tablet Take 40 mg by mouth daily.        . Calcium Carbonate-Vitamin D (CALTRATE 600+D) 600-400 MG-UNIT per tablet Take 2 tablets by mouth daily.       . chlorthalidone (HYGROTON) 25 MG tablet Take 25 mg by mouth daily.        . cloNIDine (CATAPRES) 0.1 MG tablet Take 0.1 mg by mouth daily.        . clopidogrel (PLAVIX) 75 MG tablet Take 75 mg by mouth daily.        . digoxin (LANOXIN) 0.125 MG tablet Take 125 mcg by mouth daily.        Marland Kitchen eplerenone (INSPRA) 25 MG tablet Take 25 mg by mouth daily.        Marland Kitchen estradiol (ESTRACE) 1 MG tablet Take 1 mg by mouth at bedtime.        Marland Kitchen levothyroxine (SYNTHROID) 112 MCG tablet Take  112 mcg by mouth daily.        Marland Kitchen lisinopril (PRINIVIL,ZESTRIL) 40 MG tablet Take 40 mg by mouth daily.        . metoprolol (TOPROL-XL) 100 MG 24 hr tablet Take 1/2 tab (50mg ) twice a day      . Omega-3 Fatty Acids (FISH OIL) 1000 MG CAPS Take 1 capsule by mouth every morning.       . sertraline (ZOLOFT) 100 MG tablet Take 1/2 tab (50mg ) at bedtime         ROS: No nausea or vomiting. No fever or chills.No melena or hematochezia.No bleeding.No claudication  Physical Exam: BP 121/73  Pulse 69  Ht 5\' 3"  (1.6 m)  Wt 147 lb (66.679 kg)  BMI 26.04 kg/m2 General: Well-nourished white female in no apparent distress Neck: Normal carotid upstroke no carotid bruits. No thyromegaly nonnodular thyroid. JVP is 5 cm Lungs: Clear breath sounds bilaterally without wheezing Cardiac: Regular rate and  rhythm with normal S1-S2 and no pathological murmurs Vascular: No edema. Normal dorsalis and posterior tibial pulses. Patient reports some weakness in the lower extremities. Skin: Warm and dry  12lead ECG: Normal sinus rhythm with old inferolateral wall myocardial infarction otherwise no acute changes Limited bedside ECHO:N/A   Assessment and Plan

## 2011-07-02 NOTE — Assessment & Plan Note (Signed)
No heart failure symptoms. Significant LV dysfunction. Patient is on a beta blocker, ACE inhibitor as well as an aldosterone antagonist. She is on optimal medical therapy. Outlined below we will recheck an echocardiogram to reassess LV function prior to surgery

## 2011-07-02 NOTE — Assessment & Plan Note (Signed)
No defibrillator discharges noted

## 2011-07-04 ENCOUNTER — Encounter: Payer: Medicare Other | Admitting: *Deleted

## 2011-07-12 ENCOUNTER — Encounter: Payer: Self-pay | Admitting: *Deleted

## 2011-08-14 ENCOUNTER — Other Ambulatory Visit (INDEPENDENT_AMBULATORY_CARE_PROVIDER_SITE_OTHER): Payer: Medicare Other | Admitting: *Deleted

## 2011-08-14 DIAGNOSIS — I4901 Ventricular fibrillation: Secondary | ICD-10-CM

## 2011-08-14 DIAGNOSIS — Z01810 Encounter for preprocedural cardiovascular examination: Secondary | ICD-10-CM

## 2011-08-14 DIAGNOSIS — I2589 Other forms of chronic ischemic heart disease: Secondary | ICD-10-CM

## 2011-08-14 DIAGNOSIS — I428 Other cardiomyopathies: Secondary | ICD-10-CM

## 2011-08-21 ENCOUNTER — Encounter: Payer: Self-pay | Admitting: *Deleted

## 2011-08-26 ENCOUNTER — Encounter: Payer: Self-pay | Admitting: Family Medicine

## 2011-10-01 ENCOUNTER — Ambulatory Visit (INDEPENDENT_AMBULATORY_CARE_PROVIDER_SITE_OTHER): Payer: Medicare Other | Admitting: Cardiology

## 2011-10-01 ENCOUNTER — Encounter: Payer: Self-pay | Admitting: Cardiology

## 2011-10-01 VITALS — BP 113/70 | HR 61 | Ht 63.0 in | Wt 148.0 lb

## 2011-10-01 DIAGNOSIS — I2589 Other forms of chronic ischemic heart disease: Secondary | ICD-10-CM

## 2011-10-01 DIAGNOSIS — I5022 Chronic systolic (congestive) heart failure: Secondary | ICD-10-CM

## 2011-10-01 DIAGNOSIS — I255 Ischemic cardiomyopathy: Secondary | ICD-10-CM

## 2011-10-01 DIAGNOSIS — I2510 Atherosclerotic heart disease of native coronary artery without angina pectoris: Secondary | ICD-10-CM

## 2011-10-01 NOTE — Patient Instructions (Signed)
   Echo If the results of your test are normal or stable, you will receive a letter.  If they are abnormal, the nurse will contact you by phone. Your physician wants you to follow up in: 6 months.  You will receive a reminder letter in the mail one-two months in advance.  If you don't receive a letter, please call our office to schedule the follow up appointment  

## 2011-10-01 NOTE — Progress Notes (Signed)
Marie Bottoms, MD, [facility] Board Certified in Adult Cardiovascular Medicine,Internal Medicine and Critical Care Medicine    CC: Followup patient coronary artery disease and potential upcoming surgery  HPI:   Patient is an elderly female with ischemic cardiomyopathy, LV dysfunction status post ICD placement for ventricular fibrillation. The patient has had problems with severe spinal stenosis, chronic back pain and lower extremity pain. She was scheduled for surgery within the last weeks her back pain has resolved. We had waited until March of this year to get clearance for surgery given the fact that the patient needed at least a year of dual antiplatelet therapy. As it stands now she does not require any surgery anytime soon. She has done well from a cardiac perspective she reports no chest pain shortness of breath orthopnea PND. She has had no heart failure symptoms of heart failure admissions. Her device was checked by [facility] and her ERI was 2.64 V. Other parameters for normal and there were no discharges. The patient has a dual-chamber ICD.  PMH: reviewed and listed in Problem List in Electronic Records (and see below) Past Medical History  Diagnosis Date  . Unspecified essential hypertension   . Automatic implantable cardiac defibrillator in situ     Medtronic ERI 18 months  . Chronic airway obstruction, not elsewhere classified   . Obstructive sleep apnea   . Hyperlipidemia   . Hypothyroidism   . Depression   . Osteopenia   . CAD (coronary artery disease)     status post prior stenting of the third obtuse marginal with drug eluting stent placement within the first diagonal and right coronary artery , March 2012 stent to the LAD and RCA  . B12 deficiency   . Spinal stenosis     Chronic back pain  . Ischemic cardiomyopathy      ejection fraction 35-40%   Past Surgical History  Procedure Date  . Abdominal hysterectomy   . Appendectomy   . Tonsillectomy   . Total  left knee replacement   . Icd implant     MEDTRONIC     Allergies/SH/FHX : available in Electronic Records for review  Allergies  Allergen Reactions  . Augmentin Other (See Comments)    VOMITING  . Cephalexin   . Doxazosin Mesylate     INCONTINENCE   . Neomycin   . Nsaids   . Oxycodone Hcl   . Povidone-Iodine   . Prednisone   . Tape     RASH    History   Social History  . Marital Status: Widowed    Spouse Name: N/A    Number of Children: N/A  . Years of Education: N/A   Occupational History  . RETIRED    Social History Main Topics  . Smoking status: Former Smoker -- 0.5 packs/day for 25 years    Types: Cigarettes    Quit date: 09/13/2010  . Smokeless tobacco: Never Used  . Alcohol Use: No  . Drug Use: No  . Sexually Active: Not on file   Other Topics Concern  . Not on file   Social History Narrative  . No narrative on file   Family History  Problem Relation Age of Onset  . Stroke Mother   . Stroke Father   . Heart attack Sister   . Heart attack Brother   . Stroke Sister     Medications: Current Outpatient Prescriptions  Medication Sig Dispense Refill  . acetaminophen (TYLENOL) 650 MG CR tablet Take  650 mg by mouth every 8 (eight) hours as needed.        Marland Kitchen aspirin 325 MG tablet Take 325 mg by mouth daily.        Marland Kitchen atorvastatin (LIPITOR) 40 MG tablet Take 40 mg by mouth daily.        . Calcium Carbonate-Vitamin D (CALTRATE 600+D) 600-400 MG-UNIT per tablet Take 2 tablets by mouth daily.       . chlorthalidone (HYGROTON) 25 MG tablet Take 25 mg by mouth daily.        . cloNIDine (CATAPRES) 0.1 MG tablet Take 0.1 mg by mouth daily.        . clopidogrel (PLAVIX) 75 MG tablet Take 75 mg by mouth daily.        . digoxin (LANOXIN) 0.125 MG tablet Take 125 mcg by mouth daily.        Marland Kitchen eplerenone (INSPRA) 25 MG tablet Take 25 mg by mouth daily.        Marland Kitchen estradiol (ESTRACE) 1 MG tablet Take 1 mg by mouth at bedtime.        Marland Kitchen levothyroxine (SYNTHROID) 112  MCG tablet Take 112 mcg by mouth daily.        Marland Kitchen lisinopril (PRINIVIL,ZESTRIL) 40 MG tablet Take 40 mg by mouth daily.        . metoprolol (TOPROL-XL) 100 MG 24 hr tablet Take 1/2 tab (50mg ) twice a day      . sertraline (ZOLOFT) 100 MG tablet Take 1/2 tab (50mg ) at bedtime         ROS: No nausea or vomiting. No fever or chills.No melena or hematochezia.No bleeding.No claudication. Chronic back pain  Physical Exam: BP 113/70  Pulse 61  Ht 5\' 3"  (1.6 m)  Wt 148 lb (67.132 kg)  BMI 26.22 kg/m2 General: Well-nourished white female in no distress Neck: Normal carotid upstroke no carotid bruits. No thyromegaly nonnodular thyroid. JVP 6 cm Lungs: Clear breath sounds bilaterally. No wheezing Cardiac: Regular rate and rhythm with normal S1-S2. No murmur rubs or gallops Vascular: No edema. Neurologic normal deep tendon reflexes in right ankle and knee, diminished in left ankle and presents in right knee. Skin: Warm and dry Physcologic: Normal affect  12lead ECG: Not obtained Limited bedside ECHO:N/A No images are attached to the encounter.    Patient Active Problem List  Diagnoses  . HYPERTENSION, UNSPECIFIED  . Ventricular fibrillation-no discharges   . SYSTOLIC HEART FAILURE, CHRONIC-no heart failure exacerbations   . VARICOSE VEIN  . COPD  . SPINAL STENOSIS-back pain resolved no immediate surgery   . ICD - IN SITU-ERI 18 months   . Preoperative cardiovascular examination  . Ischemic cardiomyopathy-ejection fraction 35-40%   . CAD (coronary artery disease)-most recent intervention March 2012     PLAN   The patient is doing well from a heart failure perspective. We will followup with an echocardiogram to reassess her ejection fraction.  The patient also again decrease full dose aspirin to 81 mg aspirin daily.  Today she was checked by [facility] and information was forwarded to the EP service. Her ERI appears to be around 18 months. The patient had failed to do  transtelephonic monitoring over the last several months.  I made no changes in her medications and she stable from a CAD perspective also. No ischemia testing is indicated at the present time.

## 2011-10-10 ENCOUNTER — Other Ambulatory Visit: Payer: Medicare Other

## 2011-12-09 ENCOUNTER — Encounter: Payer: Self-pay | Admitting: *Deleted

## 2011-12-16 ENCOUNTER — Telehealth: Payer: Self-pay | Admitting: Cardiology

## 2011-12-16 NOTE — Telephone Encounter (Signed)
New msg Pt called about letter she got about dec transmission. She said she saw Dr Andee Lineman in march. Does she need to send in another transmission now?

## 2011-12-18 ENCOUNTER — Ambulatory Visit (INDEPENDENT_AMBULATORY_CARE_PROVIDER_SITE_OTHER): Payer: Medicare Other | Admitting: *Deleted

## 2011-12-18 ENCOUNTER — Encounter: Payer: Self-pay | Admitting: Internal Medicine

## 2011-12-18 DIAGNOSIS — I2589 Other forms of chronic ischemic heart disease: Secondary | ICD-10-CM

## 2011-12-18 DIAGNOSIS — Z95810 Presence of automatic (implantable) cardiac defibrillator: Secondary | ICD-10-CM

## 2011-12-18 DIAGNOSIS — I255 Ischemic cardiomyopathy: Secondary | ICD-10-CM

## 2011-12-18 NOTE — Telephone Encounter (Signed)
Patient will send transmission today

## 2011-12-26 LAB — REMOTE ICD DEVICE
BATTERY VOLTAGE: 2.95 V
RV LEAD AMPLITUDE: 11.8 mv
TZAT-0004FASTVT: 8
TZAT-0005SLOWVT: 88 pct
TZAT-0005SLOWVT: 91 pct
TZAT-0011FASTVT: 10 ms
TZAT-0011SLOWVT: 10 ms
TZAT-0011SLOWVT: 10 ms
TZAT-0012FASTVT: 200 ms
TZAT-0012SLOWVT: 200 ms
TZAT-0012SLOWVT: 200 ms
TZAT-0013FASTVT: 1
TZAT-0013SLOWVT: 2
TZAT-0018SLOWVT: NEGATIVE
TZAT-0018SLOWVT: NEGATIVE
TZAT-0019FASTVT: 8 V
TZAT-0020FASTVT: 1.6 ms
TZON-0003FASTVT: 240 ms
TZON-0003SLOWVT: 340 ms
TZON-0004SLOWVT: 16
TZON-0005SLOWVT: 12
TZON-0008FASTVT: 0 ms
TZON-0008SLOWVT: 0 ms
TZON-0011AFLUTTER: 70
TZST-0001FASTVT: 3
TZST-0001FASTVT: 5
TZST-0001SLOWVT: 4
TZST-0001SLOWVT: 6
TZST-0003FASTVT: 25 J
TZST-0003FASTVT: 35 J
TZST-0003FASTVT: 35 J
TZST-0003SLOWVT: 35 J
VENTRICULAR PACING ICD: 0 pct

## 2012-01-06 ENCOUNTER — Encounter: Payer: Self-pay | Admitting: *Deleted

## 2012-03-30 ENCOUNTER — Encounter: Payer: Self-pay | Admitting: Internal Medicine

## 2012-03-30 ENCOUNTER — Ambulatory Visit (INDEPENDENT_AMBULATORY_CARE_PROVIDER_SITE_OTHER): Payer: Medicare Other | Admitting: Internal Medicine

## 2012-03-30 VITALS — BP 137/81 | HR 55 | Ht 63.0 in | Wt 148.0 lb

## 2012-03-30 DIAGNOSIS — I5022 Chronic systolic (congestive) heart failure: Secondary | ICD-10-CM

## 2012-03-30 DIAGNOSIS — I255 Ischemic cardiomyopathy: Secondary | ICD-10-CM

## 2012-03-30 DIAGNOSIS — I2589 Other forms of chronic ischemic heart disease: Secondary | ICD-10-CM

## 2012-03-30 DIAGNOSIS — Z95810 Presence of automatic (implantable) cardiac defibrillator: Secondary | ICD-10-CM

## 2012-03-30 LAB — ICD DEVICE OBSERVATION
BRDY-0002RV: 40 {beats}/min
RV LEAD AMPLITUDE: 10.5 mv
RV LEAD THRESHOLD: 1 V
TZAT-0001FASTVT: 1
TZAT-0005FASTVT: 88 pct
TZAT-0011SLOWVT: 10 ms
TZAT-0012SLOWVT: 200 ms
TZAT-0012SLOWVT: 200 ms
TZAT-0013FASTVT: 1
TZAT-0013SLOWVT: 2
TZAT-0013SLOWVT: 2
TZAT-0018FASTVT: NEGATIVE
TZAT-0018SLOWVT: NEGATIVE
TZAT-0018SLOWVT: NEGATIVE
TZAT-0019SLOWVT: 8 V
TZAT-0019SLOWVT: 8 V
TZAT-0020FASTVT: 1.6 ms
TZAT-0020SLOWVT: 1.6 ms
TZAT-0020SLOWVT: 1.6 ms
TZON-0003SLOWVT: 340 ms
TZON-0004SLOWVT: 16
TZON-0005SLOWVT: 12
TZON-0008SLOWVT: 0 ms
TZST-0001FASTVT: 2
TZST-0001FASTVT: 5
TZST-0001SLOWVT: 4
TZST-0001SLOWVT: 5
TZST-0003FASTVT: 35 J
TZST-0003FASTVT: 35 J
TZST-0003FASTVT: 35 J
TZST-0003SLOWVT: 35 J
TZST-0003SLOWVT: 35 J
VENTRICULAR PACING ICD: 1 pct

## 2012-03-30 NOTE — Assessment & Plan Note (Signed)
She denies anginal symptoms. She is somewhat sedentary because of back pain. She will continue her current medical therapy.

## 2012-03-30 NOTE — Progress Notes (Signed)
HPI [name] returns today for followup. She is a very pleasant 78 year old woman with an ischemic cardiomyopathy, chronic systolic heart failure, class II, ejection fraction 35%, hypertension, and severe arthritis. In the interim, she has been stable. She denies syncope, chest pain, or ICD shock. She is bothered by ongoing arthritis symptoms. Allergies  Allergen Reactions  . Amoxicillin-Pot Clavulanate Other (See Comments)    VOMITING  . Cephalexin   . Doxazosin Mesylate     INCONTINENCE   . Neomycin   . Nsaids   . Oxycodone Hcl   . Povidone   . Prednisone   . Tape     RASH      Current Outpatient Prescriptions  Medication Sig Dispense Refill  . acetaminophen (TYLENOL) 650 MG CR tablet Take 650 mg by mouth every 8 (eight) hours as needed.        Marland Kitchen aspirin 81 MG tablet Take 81 mg by mouth daily.      . Calcium Carbonate-Vitamin D (CALTRATE 600+D) 600-400 MG-UNIT per tablet Take 2 tablets by mouth daily.       . chlorthalidone (HYGROTON) 25 MG tablet Take 25 mg by mouth daily.        . cloNIDine (CATAPRES) 0.1 MG tablet Take 0.1 mg by mouth daily.        . clopidogrel (PLAVIX) 75 MG tablet Take 75 mg by mouth daily.        . digoxin (LANOXIN) 0.125 MG tablet Take 125 mcg by mouth daily.        Marland Kitchen eplerenone (INSPRA) 25 MG tablet Take 25 mg by mouth daily.        Marland Kitchen estradiol (ESTRACE) 1 MG tablet Take 1 mg by mouth at bedtime.        Marland Kitchen levothyroxine (SYNTHROID) 112 MCG tablet Take 112 mcg by mouth daily.        Marland Kitchen lisinopril (PRINIVIL,ZESTRIL) 40 MG tablet Take 40 mg by mouth daily.        . metoprolol (TOPROL-XL) 100 MG 24 hr tablet Take 1/2 tab (50mg ) twice a day      . sertraline (ZOLOFT) 100 MG tablet Take 1/2 tab (50mg ) at bedtime          Past Medical History  Diagnosis Date  . Unspecified essential hypertension   . Automatic implantable cardiac defibrillator in situ     Medtronic ERI 18 months  . Chronic airway obstruction, not elsewhere classified   . Obstructive  sleep apnea   . Hyperlipidemia   . Hypothyroidism   . Depression   . Osteopenia   . CAD (coronary artery disease)     status post prior stenting of the third obtuse marginal with drug eluting stent placement within the first diagonal and right coronary artery , March 2012 stent to the LAD and RCA  . B12 deficiency   . Spinal stenosis     Chronic back pain  . Ischemic cardiomyopathy      ejection fraction 35-40%    ROS:   All systems reviewed and negative except as noted in the HPI.   Past Surgical History  Procedure Date  . Abdominal hysterectomy   . Appendectomy   . Tonsillectomy   . Total left knee replacement   . Icd implant     MEDTRONIC      Family History  Problem Relation Age of Onset  . Stroke Mother   . Stroke Father   . Heart attack Sister   . Heart attack Brother   .  Stroke Sister      History   Social History  . Marital Status: Widowed    Spouse Name: N/A    Number of Children: N/A  . Years of Education: N/A   Occupational History  . RETIRED    Social History Main Topics  . Smoking status: Former Smoker -- 0.5 packs/day for 25 years    Types: Cigarettes    Quit date: 09/13/2010  . Smokeless tobacco: Never Used  . Alcohol Use: No  . Drug Use: No  . Sexually Active: Not on file   Other Topics Concern  . Not on file   Social History Narrative  . No narrative on file     BP 137/81  Pulse 55  Ht 5\' 3"  (1.6 m)  Wt 148 lb (67.132 kg)  BMI 26.22 kg/m2  Physical Exam:  Well appearing elderly woman, NAD HEENT: Unremarkable Neck:  7 cm JVD, no thyromegally Lungs:  Clear with no wheezes, rales, or rhonchi. Well-healed ICD incision. HEART:  Regular rate rhythm, no murmurs, no rubs, no clicks Abd:  soft, positive bowel sounds, no organomegally, no rebound, no guarding Ext:  2 plus pulses, no edema, no cyanosis, no clubbing Skin:  No rashes no nodules Neuro:  CN II through XII intact, motor grossly intact  DEVICE  Normal device  function.  See PaceArt for details.   Assess/Plan:

## 2012-03-30 NOTE — Assessment & Plan Note (Signed)
Her symptoms are class II. She will continue her current medical therapy and maintain a low-sodium diet. 

## 2012-03-30 NOTE — Patient Instructions (Addendum)
Your physician wants you to follow-up in: 1 year You will receive a reminder letter in the mail two months in advance. If you don't receive a letter,  please call our office to schedule the follow-up appointment.    [facility]  07/06/12.

## 2012-03-30 NOTE — Assessment & Plan Note (Signed)
Her Medtronic single chamber defibrillator is working normally. We'll plan to recheck in several months.

## 2012-04-22 ENCOUNTER — Ambulatory Visit (INDEPENDENT_AMBULATORY_CARE_PROVIDER_SITE_OTHER): Payer: Medicare Other | Admitting: Cardiology

## 2012-04-22 ENCOUNTER — Encounter: Payer: Self-pay | Admitting: Cardiology

## 2012-04-22 VITALS — BP 184/106 | HR 52 | Ht 63.0 in | Wt 148.0 lb

## 2012-04-22 DIAGNOSIS — I2510 Atherosclerotic heart disease of native coronary artery without angina pectoris: Secondary | ICD-10-CM

## 2012-04-22 DIAGNOSIS — Z95810 Presence of automatic (implantable) cardiac defibrillator: Secondary | ICD-10-CM

## 2012-04-22 DIAGNOSIS — I255 Ischemic cardiomyopathy: Secondary | ICD-10-CM

## 2012-04-22 DIAGNOSIS — I2589 Other forms of chronic ischemic heart disease: Secondary | ICD-10-CM

## 2012-04-22 DIAGNOSIS — I10 Essential (primary) hypertension: Secondary | ICD-10-CM

## 2012-04-22 NOTE — Assessment & Plan Note (Signed)
LVEF was up to 60-65% by echocardiogram in January.

## 2012-04-22 NOTE — Assessment & Plan Note (Signed)
Blood pressure quite elevated today. She denies any symptoms or changes in medications. I asked her to check blood pressure at home over the next 2 weeks and let us know. Medical changes my be needed.

## 2012-04-22 NOTE — Assessment & Plan Note (Signed)
Keep followup with Dr. Taylor. 

## 2012-04-22 NOTE — Progress Notes (Signed)
Clinical Summary [name] is a 78 y.o.female presenting for followup. She is a former patient of Dr. Andee Lineman last seen in March. She denies any angina and reports no unusual shortness of breath. She is limited by chronic back pain and spinal stenosis - tells me that she follows with Dr. Channing Mutters and is not being considered for surgery.  Record review finds an echocardiogram from January showing LVEF up to 60-65%, mild aortic stenosis. We reviewed this today.  Her blood pressure was quite elevated today, unusual in review of her measurements over time. She reports compliance with her medications. ECG shows sinus rhythm with NST changes.   Allergies  Allergen Reactions  . Amoxicillin-Pot Clavulanate Other (See Comments)    VOMITING  . Cephalexin   . Doxazosin Mesylate     INCONTINENCE   . Neomycin   . Nsaids   . Oxycodone Hcl   . Povidone   . Prednisone   . Tape     RASH     Current Outpatient Prescriptions  Medication Sig Dispense Refill  . acetaminophen (TYLENOL) 650 MG CR tablet Take 650 mg by mouth every 8 (eight) hours as needed.        Marland Kitchen aspirin 81 MG tablet Take 81 mg by mouth daily.      . Calcium Carbonate-Vitamin D (CALTRATE 600+D) 600-400 MG-UNIT per tablet Take 2 tablets by mouth daily.       . chlorthalidone (HYGROTON) 25 MG tablet Take 25 mg by mouth daily.        . cloNIDine (CATAPRES) 0.1 MG tablet Take 0.1 mg by mouth daily.        . clopidogrel (PLAVIX) 75 MG tablet Take 75 mg by mouth daily.        . digoxin (LANOXIN) 0.125 MG tablet Take 125 mcg by mouth daily.        Marland Kitchen eplerenone (INSPRA) 25 MG tablet Take 25 mg by mouth daily.        Marland Kitchen estradiol (ESTRACE) 1 MG tablet Take 1 mg by mouth at bedtime.        Marland Kitchen levothyroxine (SYNTHROID) 112 MCG tablet Take 112 mcg by mouth daily.        Marland Kitchen lisinopril (PRINIVIL,ZESTRIL) 40 MG tablet Take 40 mg by mouth daily.        . metoprolol (TOPROL-XL) 100 MG 24 hr tablet Take 1/2 tab (50mg ) twice a day      . sertraline  (ZOLOFT) 100 MG tablet Take 1/2 tab (50mg ) at bedtime       . vitamin B-12 (CYANOCOBALAMIN) 1000 MCG tablet Take 1,000 mcg by mouth daily.        Past Medical History  Diagnosis Date  . Essential hypertension, benign   . Automatic implantable cardiac defibrillator in situ     Medtronic  . COPD (chronic obstructive pulmonary disease)   . Obstructive sleep apnea   . Hyperlipidemia   . Hypothyroidism   . Depression   . Osteopenia   . Coronary atherosclerosis of native coronary artery     Stent to OM3, DES D1 and RCA 3/12  . B12 deficiency   . Spinal stenosis     Chronic back pain  . Ischemic cardiomyopathy     LVEF 35-40%    Social History [name] reports that she quit smoking about 19 months ago. Her smoking use included Cigarettes. She has a 12.5 pack-year smoking history. She has never used smokeless tobacco. [name] reports that she does not  drink alcohol.  Review of Systems No falls, uses a cane. Denies any bleeding problems. No palpitations. Stable appetite. Otherwise as outlined.  Physical Examination Filed Vitals:   04/22/12 1405  BP: 184/106  Pulse: 52   Filed Weights   04/22/12 1351  Weight: 148 lb (67.132 kg)   No acute distress. HEENT: Conjunctiva and lids normal, oropharynx clear. Neck: Supple, no elevated JVP or carotid bruits, no thyromegaly. Lungs: Clear to auscultation, nonlabored breathing at rest. Cardiac: Regular rate and rhythm, no S3, 2/6 systolic murmur, no pericardial rub. Abdomen: Soft, nontender, bowel sounds present, no guarding or rebound. Extremities: No pitting edema, distal pulses 2+. Skin: Warm and dry. Musculoskeletal: Kyphosis.    Problem List and Plan   Coronary atherosclerosis of native coronary artery Clinically stable without active angina on medical therapy. ECG reviewed, Continue observation.  Essential hypertension, benign Blood pressure quite elevated today. She denies any symptoms or changes in medications. I  asked her to check blood pressure at home over the next 2 weeks and let us know. Medical changes my be needed.  Ischemic cardiomyopathy LVEF was up to 60-65% by echocardiogram in January.  ICD - IN SITU Keep followup with Dr. Ladona Ridgel.    Jonelle Sidle, M.D., F.A.C.C.

## 2012-04-22 NOTE — Assessment & Plan Note (Signed)
Clinically stable without active angina on medical therapy. ECG reviewed, Continue observation.

## 2012-04-22 NOTE — Patient Instructions (Addendum)

## 2012-07-06 ENCOUNTER — Encounter: Payer: Medicare Other | Admitting: *Deleted

## 2012-07-14 ENCOUNTER — Encounter: Payer: Self-pay | Admitting: *Deleted

## 2012-07-20 ENCOUNTER — Ambulatory Visit (INDEPENDENT_AMBULATORY_CARE_PROVIDER_SITE_OTHER): Payer: Medicare Other | Admitting: *Deleted

## 2012-07-20 DIAGNOSIS — Z95810 Presence of automatic (implantable) cardiac defibrillator: Secondary | ICD-10-CM

## 2012-07-20 DIAGNOSIS — I255 Ischemic cardiomyopathy: Secondary | ICD-10-CM

## 2012-07-20 DIAGNOSIS — I2589 Other forms of chronic ischemic heart disease: Secondary | ICD-10-CM

## 2012-07-20 LAB — REMOTE ICD DEVICE
BRDY-0002RV: 40 {beats}/min
DEV-0020ICD: NEGATIVE
RV LEAD AMPLITUDE: 14.5 mv
TZAT-0001SLOWVT: 1
TZAT-0004SLOWVT: 8
TZAT-0004SLOWVT: 8
TZAT-0005SLOWVT: 88 pct
TZAT-0005SLOWVT: 91 pct
TZAT-0012FASTVT: 200 ms
TZAT-0012SLOWVT: 200 ms
TZAT-0012SLOWVT: 200 ms
TZAT-0013FASTVT: 1
TZAT-0013SLOWVT: 2
TZAT-0019FASTVT: 8 V
TZAT-0020FASTVT: 1.6 ms
TZAT-0020SLOWVT: 1.6 ms
TZAT-0020SLOWVT: 1.6 ms
TZON-0003FASTVT: 240 ms
TZON-0005SLOWVT: 12
TZON-0008FASTVT: 0 ms
TZON-0008SLOWVT: 0 ms
TZON-0011AFLUTTER: 70
TZST-0001FASTVT: 4
TZST-0001FASTVT: 6
TZST-0001SLOWVT: 3
TZST-0001SLOWVT: 5
TZST-0001SLOWVT: 6
TZST-0003FASTVT: 25 J
TZST-0003FASTVT: 35 J
TZST-0003FASTVT: 35 J
TZST-0003SLOWVT: 20 J

## 2012-07-28 ENCOUNTER — Encounter: Payer: Self-pay | Admitting: *Deleted

## 2012-08-12 ENCOUNTER — Encounter: Payer: Self-pay | Admitting: Internal Medicine

## 2012-10-19 ENCOUNTER — Ambulatory Visit (INDEPENDENT_AMBULATORY_CARE_PROVIDER_SITE_OTHER): Payer: Medicare Other | Admitting: Cardiology

## 2012-10-19 ENCOUNTER — Encounter: Payer: Medicare Other | Admitting: *Deleted

## 2012-10-19 ENCOUNTER — Encounter: Payer: Self-pay | Admitting: Cardiology

## 2012-10-19 VITALS — BP 138/83 | HR 63 | Ht 63.0 in | Wt 155.0 lb

## 2012-10-19 DIAGNOSIS — Z95810 Presence of automatic (implantable) cardiac defibrillator: Secondary | ICD-10-CM

## 2012-10-19 DIAGNOSIS — I10 Essential (primary) hypertension: Secondary | ICD-10-CM

## 2012-10-19 DIAGNOSIS — I255 Ischemic cardiomyopathy: Secondary | ICD-10-CM

## 2012-10-19 DIAGNOSIS — I2510 Atherosclerotic heart disease of native coronary artery without angina pectoris: Secondary | ICD-10-CM

## 2012-10-19 DIAGNOSIS — I2589 Other forms of chronic ischemic heart disease: Secondary | ICD-10-CM

## 2012-10-19 NOTE — Progress Notes (Signed)
Clinical Summary [name] is a 79 y.o.female last seen in December 2013. She has been doing well, denies any chest pain, palpitations, device discharges. She reports compliance with her medications.  Echocardiogram from January 2013 showed improved LVEF up to 60-65%, mild aortic stenosis.  She has seen Dr. Reuel Boom since our last visit, reports good lipid management based on blood work.  She enjoys getting outside in her garden this time of year. Reports NYHA class II dyspnea.   Allergies  Allergen Reactions  . Amoxicillin-Pot Clavulanate Other (See Comments)    VOMITING  . Cephalexin   . Doxazosin Mesylate     INCONTINENCE   . Neomycin   . Nsaids   . Oxycodone Hcl   . Povidone   . Prednisone   . Tape     RASH     Current Outpatient Prescriptions  Medication Sig Dispense Refill  . acetaminophen (TYLENOL) 650 MG CR tablet Take 650 mg by mouth every 8 (eight) hours as needed.        Marland Kitchen aspirin 81 MG tablet Take 81 mg by mouth daily.      Marland Kitchen atorvastatin (LIPITOR) 40 MG tablet Take 1 tablet by mouth daily.      . Calcium Carbonate-Vitamin D (CALTRATE 600+D) 600-400 MG-UNIT per tablet Take 1 tablet by mouth daily.       . chlorthalidone (HYGROTON) 25 MG tablet Take 25 mg by mouth daily.        . cloNIDine (CATAPRES) 0.1 MG tablet Take 0.1 mg by mouth daily.        . clopidogrel (PLAVIX) 75 MG tablet Take 75 mg by mouth daily.        . digoxin (LANOXIN) 0.125 MG tablet Take 125 mcg by mouth daily.        Marland Kitchen eplerenone (INSPRA) 25 MG tablet Take 25 mg by mouth daily.        Marland Kitchen estradiol (ESTRACE) 1 MG tablet Take 1 mg by mouth at bedtime.        Marland Kitchen levothyroxine (SYNTHROID) 112 MCG tablet Take 112 mcg by mouth daily.        Marland Kitchen lisinopril (PRINIVIL,ZESTRIL) 40 MG tablet Take 40 mg by mouth daily.        . metoprolol (TOPROL-XL) 100 MG 24 hr tablet Take 1/2 tab (50mg ) twice a day      . sertraline (ZOLOFT) 100 MG tablet Take 1/2 tab (50mg ) at bedtime       . vitamin B-12  (CYANOCOBALAMIN) 1000 MCG tablet Take 1,000 mcg by mouth daily.       No current facility-administered medications for this visit.    Past Medical History  Diagnosis Date  . Essential hypertension, benign   . Automatic implantable cardiac defibrillator in situ     Medtronic  . COPD (chronic obstructive pulmonary disease)   . Obstructive sleep apnea   . Hyperlipidemia   . Hypothyroidism   . Depression   . Osteopenia   . Coronary atherosclerosis of native coronary artery     Stent to OM3, DES D1 and RCA 3/12  . B12 deficiency   . Spinal stenosis     Chronic back pain  . Ischemic cardiomyopathy     LVEF 35-40%    Past Surgical History  Procedure Laterality Date  . Abdominal hysterectomy    . Appendectomy    . Tonsillectomy    . Total left knee replacement    . Icd implant      MEDTRONIC  Social History [name] reports that she quit smoking about 2 years ago. Her smoking use included Cigarettes. She has a 12.5 pack-year smoking history. She has never used smokeless tobacco. [name] reports that she does not drink alcohol.  Review of Systems Arthritic pain, hip pain. Stable appetite. No bleeding problems. Otherwise negative.  Physical Examination Filed Vitals:   10/19/12 1258  BP: 138/83  Pulse: 63   Filed Weights   10/19/12 1258  Weight: 155 lb (70.308 kg)    No acute distress.  HEENT: Conjunctiva and lids normal, oropharynx clear.  Neck: Supple, no elevated JVP or carotid bruits, no thyromegaly.  Lungs: Clear to auscultation, nonlabored breathing at rest.  Cardiac: Regular rate and rhythm, no S3, 2/6 systolic murmur, no pericardial rub.  Abdomen: Soft, nontender, bowel sounds present, no guarding or rebound.  Extremities: No pitting edema, distal pulses 2+.  Skin: Warm and dry.  Musculoskeletal: Kyphosis.   Problem List and Plan   Ischemic cardiomyopathy Patient symptomatically stable, LVEF 60-65% by followup assessment last year. She is  doing well on medical therapy.  Coronary atherosclerosis of native coronary artery No active angina symptoms. Continue observation for now.  Essential hypertension, benign Blood pressure is better today. No changes were made to her regimen.  ICD - IN SITU Keep followup with Dr. Ladona Ridgel.    Jonelle Sidle, M.D., F.A.C.C.

## 2012-10-19 NOTE — Assessment & Plan Note (Signed)
Blood pressure is better today. No changes were made to her regimen.

## 2012-10-19 NOTE — Assessment & Plan Note (Signed)
No active angina symptoms. Continue observation for now.

## 2012-10-19 NOTE — Patient Instructions (Addendum)

## 2012-10-19 NOTE — Assessment & Plan Note (Signed)
Patient symptomatically stable, LVEF 60-65% by followup assessment last year. She is doing well on medical therapy.

## 2012-10-19 NOTE — Assessment & Plan Note (Signed)
Keep followup with Dr. Taylor. 

## 2012-10-26 ENCOUNTER — Encounter: Payer: Self-pay | Admitting: *Deleted

## 2012-10-30 ENCOUNTER — Ambulatory Visit (INDEPENDENT_AMBULATORY_CARE_PROVIDER_SITE_OTHER): Payer: Medicare Other | Admitting: *Deleted

## 2012-10-30 ENCOUNTER — Other Ambulatory Visit: Payer: Self-pay | Admitting: Internal Medicine

## 2012-10-30 DIAGNOSIS — I255 Ischemic cardiomyopathy: Secondary | ICD-10-CM

## 2012-10-30 DIAGNOSIS — I2589 Other forms of chronic ischemic heart disease: Secondary | ICD-10-CM

## 2012-10-30 DIAGNOSIS — Z95810 Presence of automatic (implantable) cardiac defibrillator: Secondary | ICD-10-CM

## 2012-11-02 ENCOUNTER — Telehealth: Payer: Self-pay | Admitting: Internal Medicine

## 2012-11-02 NOTE — Telephone Encounter (Signed)
New Prob    Pt states her transmission did not go through and she would like to speak to someone for assistance.

## 2012-11-02 NOTE — Telephone Encounter (Signed)
Spoke w/pt in regards to transmission. Transmission was received and pt aware that will receive letter in mail.

## 2012-11-09 LAB — REMOTE ICD DEVICE
BATTERY VOLTAGE: 2.75 V
CHARGE TIME: 10.6 s
RV LEAD IMPEDENCE ICD: 592 Ohm
TZAT-0001FASTVT: 1
TZAT-0004FASTVT: 8
TZAT-0004SLOWVT: 8
TZAT-0004SLOWVT: 8
TZAT-0005FASTVT: 88 pct
TZAT-0005SLOWVT: 88 pct
TZAT-0011SLOWVT: 10 ms
TZAT-0011SLOWVT: 10 ms
TZAT-0012SLOWVT: 200 ms
TZAT-0012SLOWVT: 200 ms
TZAT-0013FASTVT: 1
TZON-0003FASTVT: 240 ms
TZON-0003SLOWVT: 340 ms
TZON-0011AFLUTTER: 70
TZST-0001FASTVT: 2
TZST-0001FASTVT: 5
TZST-0001SLOWVT: 4
TZST-0001SLOWVT: 5
TZST-0003FASTVT: 35 J
TZST-0003FASTVT: 35 J
TZST-0003FASTVT: 35 J
TZST-0003SLOWVT: 20 J
TZST-0003SLOWVT: 35 J
TZST-0003SLOWVT: 35 J
VENTRICULAR PACING ICD: 0 pct

## 2012-12-14 ENCOUNTER — Encounter: Payer: Self-pay | Admitting: Internal Medicine

## 2013-02-08 ENCOUNTER — Encounter: Payer: Medicare Other | Admitting: *Deleted

## 2013-02-16 ENCOUNTER — Encounter: Payer: Self-pay | Admitting: *Deleted

## 2013-02-25 ENCOUNTER — Encounter: Payer: Self-pay | Admitting: Internal Medicine

## 2013-02-25 ENCOUNTER — Ambulatory Visit (INDEPENDENT_AMBULATORY_CARE_PROVIDER_SITE_OTHER): Payer: Medicare Other | Admitting: *Deleted

## 2013-02-25 DIAGNOSIS — I255 Ischemic cardiomyopathy: Secondary | ICD-10-CM

## 2013-02-25 DIAGNOSIS — I2589 Other forms of chronic ischemic heart disease: Secondary | ICD-10-CM

## 2013-02-25 DIAGNOSIS — Z95810 Presence of automatic (implantable) cardiac defibrillator: Secondary | ICD-10-CM

## 2013-02-26 LAB — REMOTE ICD DEVICE
BATTERY VOLTAGE: 2.69 V
CHARGE TIME: 10.6 s
RV LEAD IMPEDENCE ICD: 424 Ohm
TZAT-0004FASTVT: 8
TZAT-0004SLOWVT: 8
TZAT-0005FASTVT: 88 pct
TZAT-0005SLOWVT: 88 pct
TZAT-0005SLOWVT: 91 pct
TZAT-0011FASTVT: 10 ms
TZAT-0011SLOWVT: 10 ms
TZAT-0011SLOWVT: 10 ms
TZAT-0012FASTVT: 200 ms
TZAT-0012SLOWVT: 200 ms
TZAT-0012SLOWVT: 200 ms
TZAT-0013FASTVT: 1
TZON-0003FASTVT: 240 ms
TZON-0003SLOWVT: 340 ms
TZON-0005SLOWVT: 12
TZON-0008SLOWVT: 0 ms
TZON-0011AFLUTTER: 70
TZST-0001FASTVT: 4
TZST-0001FASTVT: 5
TZST-0001SLOWVT: 4
TZST-0001SLOWVT: 6
TZST-0003FASTVT: 25 J
TZST-0003FASTVT: 35 J
TZST-0003FASTVT: 35 J
TZST-0003FASTVT: 35 J
TZST-0003SLOWVT: 20 J
TZST-0003SLOWVT: 35 J
VENTRICULAR PACING ICD: 0 pct

## 2013-03-05 ENCOUNTER — Encounter: Payer: Self-pay | Admitting: *Deleted

## 2013-03-26 ENCOUNTER — Ambulatory Visit (INDEPENDENT_AMBULATORY_CARE_PROVIDER_SITE_OTHER): Payer: Medicare Other | Admitting: Internal Medicine

## 2013-03-26 ENCOUNTER — Encounter: Payer: Self-pay | Admitting: Internal Medicine

## 2013-03-26 VITALS — BP 130/86 | HR 55 | Ht 61.0 in | Wt 149.0 lb

## 2013-03-26 DIAGNOSIS — I255 Ischemic cardiomyopathy: Secondary | ICD-10-CM

## 2013-03-26 DIAGNOSIS — I5022 Chronic systolic (congestive) heart failure: Secondary | ICD-10-CM

## 2013-03-26 DIAGNOSIS — I2510 Atherosclerotic heart disease of native coronary artery without angina pectoris: Secondary | ICD-10-CM

## 2013-03-26 DIAGNOSIS — Z95810 Presence of automatic (implantable) cardiac defibrillator: Secondary | ICD-10-CM

## 2013-03-26 DIAGNOSIS — I2589 Other forms of chronic ischemic heart disease: Secondary | ICD-10-CM

## 2013-03-26 LAB — ICD DEVICE OBSERVATION
BATTERY VOLTAGE: 2.66 V
BRDY-0002RV: 40 {beats}/min
CHARGE TIME: 11.34 s
FVT: 0
TZAT-0001FASTVT: 1
TZAT-0004FASTVT: 8
TZAT-0004SLOWVT: 8
TZAT-0004SLOWVT: 8
TZAT-0005FASTVT: 88 pct
TZAT-0012SLOWVT: 200 ms
TZAT-0012SLOWVT: 200 ms
TZAT-0020SLOWVT: 1.6 ms
TZAT-0020SLOWVT: 1.6 ms
TZON-0003SLOWVT: 340 ms
TZON-0011AFLUTTER: 70
TZST-0001FASTVT: 2
TZST-0001FASTVT: 5
TZST-0001SLOWVT: 3
TZST-0001SLOWVT: 5
TZST-0003FASTVT: 35 J
TZST-0003FASTVT: 35 J
TZST-0003SLOWVT: 20 J
TZST-0003SLOWVT: 35 J
VENTRICULAR PACING ICD: 0 pct
VF: 0

## 2013-03-26 NOTE — Progress Notes (Signed)
HPI [name] rreturns today for followup. She is a 79 year old woman with a history of ventricular fibrillation, and ischemic cardiomyopathy, chronic class II systolic heart failure, status post ICD implantation. In the interim, the patient has been stable from an arrhythmia and cardiac perspective. She is bothered by difficulty walking, and lower leg pain. Allergies  Allergen Reactions  . Amoxicillin-Pot Clavulanate Other (See Comments)    VOMITING  . Cephalexin   . Doxazosin Mesylate     INCONTINENCE   . Neomycin   . Nsaids   . Oxycodone Hcl   . Povidone   . Prednisone   . Tape     RASH      Current Outpatient Prescriptions  Medication Sig Dispense Refill  . acetaminophen (TYLENOL) 650 MG CR tablet Take 650 mg by mouth every 8 (eight) hours as needed.        Marland Kitchen aspirin 81 MG tablet Take 81 mg by mouth daily.      . Calcium Carbonate-Vitamin D (CALTRATE 600+D) 600-400 MG-UNIT per tablet Take 1 tablet by mouth daily.       . chlorthalidone (HYGROTON) 25 MG tablet Take 25 mg by mouth daily.        . cloNIDine (CATAPRES) 0.1 MG tablet Take 0.1 mg by mouth daily.        . clopidogrel (PLAVIX) 75 MG tablet Take 75 mg by mouth daily.        . digoxin (LANOXIN) 0.125 MG tablet Take 125 mcg by mouth daily.        Marland Kitchen eplerenone (INSPRA) 25 MG tablet Take 25 mg by mouth daily.        Marland Kitchen estradiol (ESTRACE) 1 MG tablet Take 1 mg by mouth at bedtime.        Marland Kitchen levothyroxine (SYNTHROID) 112 MCG tablet Take 112 mcg by mouth daily.        Marland Kitchen lisinopril (PRINIVIL,ZESTRIL) 40 MG tablet Take 40 mg by mouth daily.        . metoprolol (TOPROL-XL) 100 MG 24 hr tablet Take 1/2 tab (50mg ) twice a day      . sertraline (ZOLOFT) 100 MG tablet Take 1/2 tab (50mg ) at bedtime       . vitamin B-12 (CYANOCOBALAMIN) 1000 MCG tablet Take 1,000 mcg by mouth daily.       No current facility-administered medications for this visit.     Past Medical History  Diagnosis Date  . Essential hypertension, benign    . Automatic implantable cardiac defibrillator in situ     Medtronic  . COPD (chronic obstructive pulmonary disease)   . Obstructive sleep apnea   . Hyperlipidemia   . Hypothyroidism   . Depression   . Osteopenia   . Coronary atherosclerosis of native coronary artery     Stent to OM3, DES D1 and RCA 3/12  . B12 deficiency   . Spinal stenosis     Chronic back pain  . Ischemic cardiomyopathy     LVEF 35-40%    ROS:   All systems reviewed and negative except as noted in the HPI.   Past Surgical History  Procedure Laterality Date  . Abdominal hysterectomy    . Appendectomy    . Tonsillectomy    . Total left knee replacement    . Icd implant      MEDTRONIC      Family History  Problem Relation Age of Onset  . Stroke Mother   . Stroke Father   . Heart attack  Sister   . Heart attack Brother   . Stroke Sister      History   Social History  . Marital Status: Widowed    Spouse Name: N/A    Number of Children: N/A  . Years of Education: N/A   Occupational History  . RETIRED    Social History Main Topics  . Smoking status: Former Smoker -- 0.50 packs/day for 25 years    Types: Cigarettes    Quit date: 09/13/2010  . Smokeless tobacco: Never Used  . Alcohol Use: No  . Drug Use: No  . Sexual Activity: Not on file   Other Topics Concern  . Not on file   Social History Narrative  . No narrative on file     BP 130/86  Pulse 55  Ht 5\' 1"  (1.549 m)  Wt 149 lb (67.586 kg)  BMI 28.17 kg/m2  SpO2 96%  Physical Exam:  Well appearing 79 year old woman,NAD HEENT: Unremarkable Neck:  7 cm JVD, no thyromegally Back:  No CVA tenderness Lungs:  Clear with no wheezes, rales, or rhonchi. HEART:  Regular rate rhythm, no murmurs, no rubs, no clicks Abd:  soft, positive bowel sounds, no organomegally, no rebound, no guarding Ext:  2 plus pulses, no edema, no cyanosis, no clubbing Skin:  No rashes no nodules Neuro:  CN II through XII intact, motor grossly  intact   DEVICE  Normal device function.  See PaceArt for details.   Assess/Plan:

## 2013-03-26 NOTE — Assessment & Plan Note (Signed)
Her Medtronic device is working normally. She is approaching elective replacement.

## 2013-03-26 NOTE — Patient Instructions (Addendum)
Your physician recommends that you schedule a follow-up appointment in: One year with Dr Ladona Ridgel and [facility] on Dec 15

## 2013-03-26 NOTE — Assessment & Plan Note (Signed)
She denies anginal symptoms. No change in medical therapy. 

## 2013-03-26 NOTE — Assessment & Plan Note (Signed)
Her symptoms are class II. She'll continue her current medical therapy, and maintain a low-sodium diet.

## 2013-03-30 ENCOUNTER — Encounter: Payer: Self-pay | Admitting: Internal Medicine

## 2013-04-21 ENCOUNTER — Ambulatory Visit (INDEPENDENT_AMBULATORY_CARE_PROVIDER_SITE_OTHER): Payer: Medicare Other | Admitting: Cardiology

## 2013-04-21 ENCOUNTER — Encounter: Payer: Self-pay | Admitting: Cardiology

## 2013-04-21 VITALS — BP 171/90 | HR 60 | Ht 62.0 in | Wt 149.8 lb

## 2013-04-21 DIAGNOSIS — I2589 Other forms of chronic ischemic heart disease: Secondary | ICD-10-CM

## 2013-04-21 DIAGNOSIS — I2510 Atherosclerotic heart disease of native coronary artery without angina pectoris: Secondary | ICD-10-CM

## 2013-04-21 DIAGNOSIS — I255 Ischemic cardiomyopathy: Secondary | ICD-10-CM

## 2013-04-21 DIAGNOSIS — E782 Mixed hyperlipidemia: Secondary | ICD-10-CM | POA: Insufficient documentation

## 2013-04-21 DIAGNOSIS — I10 Essential (primary) hypertension: Secondary | ICD-10-CM

## 2013-04-21 NOTE — Assessment & Plan Note (Signed)
Her LDL looked good in June, however she has subsequently had to stop Lipitor due to leg cramps. Due for followup with Dr. Reuel Boom for repeat assessment. May need to consider a different statin.

## 2013-04-21 NOTE — Assessment & Plan Note (Signed)
LVEF improved to normal range as of echocardiogram in January of this year.

## 2013-04-21 NOTE — Progress Notes (Signed)
Clinical Summary Ms. Bromwell is a 79 y.o.female last seen in April. Interval followup note by Dr. Ladona Ridgel in September. Device function was normal, although she is approaching elective replacement.  She reports no angina symptoms, stable dyspnea on exertion. Blood pressure was elevated today after walking into the office, she reports systolics usually in the 130s at most. She will be seeing Dr. Reuel Boom soon for a followup visit. I note that her lipids look fairly good the exception of increased triglycerides back in June. Her LDL was 82. She does state that she has had to stop Lipitor due to cramping.  Echocardiogram from January 2013 showed improved LVEF up to 60-65%, mild aortic stenosis.   Allergies  Allergen Reactions  . Amoxicillin-Pot Clavulanate Other (See Comments)    VOMITING  . Cephalexin   . Doxazosin Mesylate     INCONTINENCE   . Neomycin   . Nsaids   . Oxycodone Hcl   . Povidone   . Prednisone   . Tape     RASH     Current Outpatient Prescriptions  Medication Sig Dispense Refill  . acetaminophen (TYLENOL) 650 MG CR tablet Take 650 mg by mouth every 8 (eight) hours as needed.        Marland Kitchen aspirin 81 MG tablet Take 81 mg by mouth daily.      . Calcium Carbonate-Vitamin D (CALTRATE 600+D) 600-400 MG-UNIT per tablet Take 1 tablet by mouth daily.       . chlorthalidone (HYGROTON) 25 MG tablet Take 25 mg by mouth daily.        . cloNIDine (CATAPRES) 0.1 MG tablet Take 0.1 mg by mouth daily.        . clopidogrel (PLAVIX) 75 MG tablet Take 75 mg by mouth daily.        . digoxin (LANOXIN) 0.125 MG tablet Take 125 mcg by mouth daily.        Marland Kitchen eplerenone (INSPRA) 25 MG tablet Take 25 mg by mouth daily.        Marland Kitchen estradiol (ESTRACE) 1 MG tablet Take 1 mg by mouth at bedtime.        Marland Kitchen levothyroxine (SYNTHROID) 112 MCG tablet Take 112 mcg by mouth daily.        Marland Kitchen lisinopril (PRINIVIL,ZESTRIL) 40 MG tablet Take 40 mg by mouth daily.        . metoprolol (TOPROL-XL) 100 MG 24 hr  tablet Take 1/2 tab (50mg ) twice a day      . sertraline (ZOLOFT) 100 MG tablet Take 1/2 tab (50mg ) at bedtime       . vitamin B-12 (CYANOCOBALAMIN) 1000 MCG tablet Take 1,000 mcg by mouth 2 (two) times daily.        No current facility-administered medications for this visit.    Past Medical History  Diagnosis Date  . Essential hypertension, benign   . Automatic implantable cardiac defibrillator in situ     Medtronic  . COPD (chronic obstructive pulmonary disease)   . Obstructive sleep apnea   . Hyperlipidemia   . Hypothyroidism   . Depression   . Osteopenia   . Coronary atherosclerosis of native coronary artery     Stent to OM3, DES D1 and RCA 3/12  . B12 deficiency   . Spinal stenosis     Chronic back pain  . Ischemic cardiomyopathy     LVEF 35-40%    Social History Ms. Simien reports that she quit smoking about 2 years ago. Her smoking use  included Cigarettes. She has a 12.5 pack-year smoking history. She has never used smokeless tobacco. Ms. Gossen reports that she does not drink alcohol.  Review of Systems No palpitations. No reported bleeding episodes. Stable appetite. No orthopnea or PND. Otherwise negative.  Physical Examination Filed Vitals:   04/21/13 1309  BP: 171/90  Pulse: 60   Filed Weights   04/21/13 1309  Weight: 149 lb 12.8 oz (67.949 kg)    No acute distress.  HEENT: Conjunctiva and lids normal, oropharynx clear.  Neck: Supple, no elevated JVP or carotid bruits, no thyromegaly.  Lungs: Clear to auscultation, nonlabored breathing at rest.  Cardiac: Regular rate and rhythm, no S3, 2/6 systolic murmur, no pericardial rub.  Abdomen: Soft, nontender, bowel sounds present, no guarding or rebound.  Extremities: No pitting edema, distal pulses 2+.  Skin: Warm and dry.  Musculoskeletal: Kyphosis.   Problem List and Plan   Coronary atherosclerosis of native coronary artery Symptomatically stable on medical therapy. She states that she is active at  home with her ADLs. Continue observation for now.  Essential hypertension, benign Blood pressure elevated today, has not been the case on home checks. No change made today to current regimen, keep followup with Dr. Reuel Boom.  Ischemic cardiomyopathy LVEF improved to normal range as of echocardiogram in January of this year.  Mixed hyperlipidemia Her LDL looked good in June, however she has subsequently had to stop Lipitor due to leg cramps. Due for followup with Dr. Reuel Boom for repeat assessment. May need to consider a different statin.    Jonelle Sidle, M.D., F.A.C.C.

## 2013-04-21 NOTE — Assessment & Plan Note (Signed)
Symptomatically stable on medical therapy. She states that she is active at home with her ADLs. Continue observation for now.

## 2013-04-21 NOTE — Patient Instructions (Signed)
Your physician recommends that you schedule a follow-up appointment in: 6 months at the [place] office. You will receive a reminder letter in the mail in about months reminding you to call and schedule your appointment. If you don't receive this letter, please contact our office. Your physician recommends that you continue on your current medications as directed. Please refer to the Current Medication list given to you today.

## 2013-04-21 NOTE — Assessment & Plan Note (Signed)
Blood pressure elevated today, has not been the case on home checks. No change made today to current regimen, keep followup with Dr. Reuel Boom.

## 2013-06-28 ENCOUNTER — Ambulatory Visit (INDEPENDENT_AMBULATORY_CARE_PROVIDER_SITE_OTHER): Payer: Medicare Other | Admitting: *Deleted

## 2013-06-28 ENCOUNTER — Encounter: Payer: Self-pay | Admitting: Internal Medicine

## 2013-06-28 DIAGNOSIS — Z95810 Presence of automatic (implantable) cardiac defibrillator: Secondary | ICD-10-CM

## 2013-06-28 DIAGNOSIS — I2589 Other forms of chronic ischemic heart disease: Secondary | ICD-10-CM

## 2013-06-28 DIAGNOSIS — I255 Ischemic cardiomyopathy: Secondary | ICD-10-CM

## 2013-06-28 LAB — MDC_IDC_ENUM_SESS_TYPE_REMOTE
Date Time Interrogation Session: 20141215185700
HighPow Impedance: 39 Ohm
Lead Channel Sensing Intrinsic Amplitude: 11.5 mV
Lead Channel Setting Pacing Amplitude: 2.5 V
Zone Setting Detection Interval: 240 ms
Zone Setting Detection Interval: 300 ms

## 2013-07-02 ENCOUNTER — Other Ambulatory Visit: Payer: Self-pay | Admitting: Internal Medicine

## 2013-07-12 ENCOUNTER — Encounter: Payer: Self-pay | Admitting: *Deleted

## 2013-09-16 ENCOUNTER — Encounter (HOSPITAL_COMMUNITY): Payer: Self-pay | Admitting: Pharmacy Technician

## 2013-09-28 ENCOUNTER — Encounter (HOSPITAL_COMMUNITY)
Admission: RE | Admit: 2013-09-28 | Discharge: 2013-09-28 | Disposition: A | Discharge status: Home / Self Care | Payer: Medicare Other | Source: Ambulatory Visit | Attending: Ophthalmology | Admitting: Ophthalmology

## 2013-09-28 ENCOUNTER — Encounter (HOSPITAL_COMMUNITY): Payer: Self-pay

## 2013-09-28 DIAGNOSIS — Z01812 Encounter for preprocedural laboratory examination: Secondary | ICD-10-CM | POA: Insufficient documentation

## 2013-09-28 HISTORY — DX: Acute myocardial infarction, unspecified: I21.9

## 2013-09-28 LAB — BASIC METABOLIC PANEL
BUN: 18 mg/dL (ref 6–23)
CALCIUM: 9.9 mg/dL (ref 8.4–10.5)
CHLORIDE: 100 meq/L (ref 96–112)
CO2: 31 meq/L (ref 19–32)
Creatinine, Ser: 1.04 mg/dL (ref 0.50–1.10)
GFR calc Af Amer: 57 mL/min — ABNORMAL LOW (ref >90)
GFR calc non Af Amer: 49 mL/min — ABNORMAL LOW (ref >90)
Glucose, Bld: 108 mg/dL — ABNORMAL HIGH (ref 70–99)
Potassium: 4.5 mEq/L (ref 3.7–5.3)
SODIUM: 141 meq/L (ref 137–147)

## 2013-09-28 LAB — HEMOGLOBIN AND HEMATOCRIT, BLOOD
HEMATOCRIT: 43.4 % (ref 36.0–46.0)
Hemoglobin: 14.8 g/dL (ref 12.0–15.0)

## 2013-09-28 NOTE — Pre-Procedure Instructions (Signed)
PAT visit completed.  Patient understands procedure and verbalizes no questions at this time.  Written information provided.

## 2013-09-28 NOTE — Patient Instructions (Signed)
Your procedure is scheduled on:  Monday, 10/04/13  Report to [facility] at    [place]    AM.  Call this number if you have problems the morning of surgery: [phone]   Remember:   Do not eat or drink   After Midnight.  Take these medicines the morning of surgery with A SIP OF WATER: chlorthalidone, clonidine, digoxin, inspra, synthroid, metoprolol, zoloft   Do not wear jewelry, make-up or nail polish.  Do not wear lotions, powders, or perfumes. You may wear deodorant.  Do not bring valuables to the hospital.  Contacts, dentures or bridgework may not be worn into surgery.     Patients discharged the day of surgery will not be allowed to drive home.  Name and phone number of your driver: driver  Special Instructions: Use eye drops as directed.   Please read over the following fact sheets that you were given: Pain Booklet, Anesthesia Post-op Instructions and Care and Recovery After Surgery    Cataract Surgery  A cataract is a clouding of the lens of the eye. When a lens becomes cloudy, vision is reduced based on the degree and nature of the clouding. Surgery may be needed to improve vision. Surgery removes the cloudy lens and usually replaces it with a substitute lens (intraocular lens, IOL). LET YOUR EYE DOCTOR KNOW ABOUT:  Allergies to food or medicine.   Medicines taken including herbs, eyedrops, over-the-counter medicines, and creams.   Use of steroids (by mouth or creams).   Previous problems with anesthetics or numbing medicine.   History of bleeding problems or blood clots.   Previous surgery.   Other health problems, including diabetes and kidney problems.   Possibility of pregnancy, if this applies.  RISKS AND COMPLICATIONS  Infection.   Inflammation of the eyeball (endophthalmitis) that can spread to both eyes (sympathetic ophthalmia).   Poor wound healing.   If an IOL is inserted, it can later fall out of proper position. This is very uncommon.   Clouding of the  part of your eye that holds an IOL in place. This is called an "after-cataract." These are uncommon, but easily treated.  BEFORE THE PROCEDURE  Do not eat or drink anything except small amounts of water for 8 to 12 before your surgery, or as directed by your caregiver.   Unless you are told otherwise, continue any eyedrops you have been prescribed.   Talk to your primary caregiver about all other medicines that you take (both prescription and non-prescription). In some cases, you may need to stop or change medicines near the time of your surgery. This is most important if you are taking blood-thinning medicine.Do not stop medicines unless you are told to do so.   Arrange for someone to drive you to and from the procedure.   Do not put contact lenses in either eye on the day of your surgery.  PROCEDURE There is more than one method for safely removing a cataract. Your doctor can explain the differences and help determine which is best for you. Phacoemulsification surgery is the most common form of cataract surgery.  An injection is given behind the eye or eyedrops are given to make this a painless procedure.   A small cut (incision) is made on the edge of the clear, dome-shaped surface that covers the front of the eye (cornea).   A tiny probe is painlessly inserted into the eye. This device gives off ultrasound waves that soften and break up the cloudy center  of the lens. This makes it easier for the cloudy lens to be removed by suction.   An IOL may be implanted.   The normal lens of the eye is covered by a clear capsule. Part of that capsule is intentionally left in the eye to support the IOL.   Your surgeon may or may not use stitches to close the incision.  There are other forms of cataract surgery that require a larger incision and stiches to close the eye. This approach is taken in cases where the doctor feels that the cataract cannot be easily removed using  phacoemulsification. AFTER THE PROCEDURE  When an IOL is implanted, it does not need care. It becomes a permanent part of your eye and cannot be seen or felt.   Your doctor will schedule follow-up exams to check on your progress.   Review your other medicines with your doctor to see which can be resumed after surgery.   Use eyedrops or take medicine as prescribed by your doctor.  Document Released: 06/20/2011 Document Reviewed: 06/17/2011 [facility] Patient Information 2012 [employer], [place].  PATIENT INSTRUCTIONS POST-ANESTHESIA  IMMEDIATELY FOLLOWING SURGERY:  Do not drive or operate machinery for the first twenty four hours after surgery.  Do not make any important decisions for twenty four hours after surgery or while taking narcotic pain medications or sedatives.  If you develop intractable nausea and vomiting or a severe headache please notify your doctor immediately.  FOLLOW-UP:  Please make an appointment with your surgeon as instructed. You do not need to follow up with anesthesia unless specifically instructed to do so.  WOUND CARE INSTRUCTIONS (if applicable):  Keep a dry clean dressing on the anesthesia/puncture wound site if there is drainage.  Once the wound has quit draining you may leave it open to air.  Generally you should leave the bandage intact for twenty four hours unless there is drainage.  If the epidural site drains for more than 36-48 hours please call the anesthesia department.  QUESTIONS?:  Please feel free to call your physician or the hospital operator if you have any questions, and they will be happy to assist you.

## 2013-09-29 ENCOUNTER — Ambulatory Visit (INDEPENDENT_AMBULATORY_CARE_PROVIDER_SITE_OTHER): Payer: Medicare Other | Admitting: *Deleted

## 2013-09-29 DIAGNOSIS — I2589 Other forms of chronic ischemic heart disease: Secondary | ICD-10-CM

## 2013-09-29 DIAGNOSIS — I255 Ischemic cardiomyopathy: Secondary | ICD-10-CM

## 2013-10-01 MED ORDER — CYCLOPENTOLATE-PHENYLEPHRINE OP SOLN OPTIME - NO CHARGE
OPHTHALMIC | Status: AC
  Filled 2013-10-01: qty 2

## 2013-10-01 MED ORDER — LIDOCAINE HCL (PF) 1 % IJ SOLN
INTRAMUSCULAR | Status: AC
  Filled 2013-10-01: qty 2

## 2013-10-01 MED ORDER — LIDOCAINE HCL 3.5 % OP GEL
OPHTHALMIC | Status: AC
  Filled 2013-10-01: qty 1

## 2013-10-01 MED ORDER — TETRACAINE HCL 0.5 % OP SOLN
OPHTHALMIC | Status: AC
  Filled 2013-10-01: qty 2

## 2013-10-01 MED ORDER — PHENYLEPHRINE HCL 2.5 % OP SOLN
OPHTHALMIC | Status: AC
  Filled 2013-10-01: qty 15

## 2013-10-01 MED ORDER — NEOMYCIN-POLYMYXIN-DEXAMETH 3.5-10000-0.1 OP SUSP
OPHTHALMIC | Status: AC
  Filled 2013-10-01: qty 5

## 2013-10-02 LAB — MDC_IDC_ENUM_SESS_TYPE_REMOTE
Brady Statistic RV Percent Paced: 1 %
Lead Channel Impedance Value: 464 Ohm
Lead Channel Sensing Intrinsic Amplitude: 12.1 mV
Lead Channel Setting Pacing Amplitude: 2.5 V
Lead Channel Setting Pacing Pulse Width: 0.4 ms
Lead Channel Setting Sensing Sensitivity: 0.3 mV
MDC IDC MSMT BATTERY VOLTAGE: 2.64 V
MDC IDC SET ZONE DETECTION INTERVAL: 300 ms
MDC IDC SET ZONE DETECTION INTERVAL: 340 ms
Zone Setting Detection Interval: 240 ms

## 2013-10-04 ENCOUNTER — Encounter (HOSPITAL_COMMUNITY): Payer: Medicare Other | Admitting: Anesthesiology

## 2013-10-04 ENCOUNTER — Encounter (HOSPITAL_COMMUNITY)
Admission: RE | Disposition: A | Discharge status: Home / Self Care | Payer: Self-pay | Source: Ambulatory Visit | Attending: Ophthalmology

## 2013-10-04 ENCOUNTER — Ambulatory Visit (HOSPITAL_COMMUNITY): Payer: Medicare Other | Admitting: Anesthesiology

## 2013-10-04 ENCOUNTER — Ambulatory Visit (HOSPITAL_COMMUNITY)
Admission: RE | Admit: 2013-10-04 | Discharge: 2013-10-04 | Disposition: A | Discharge status: Home / Self Care | Payer: Medicare Other | Source: Ambulatory Visit | Attending: Ophthalmology | Admitting: Ophthalmology

## 2013-10-04 ENCOUNTER — Encounter (HOSPITAL_COMMUNITY): Payer: Self-pay | Admitting: *Deleted

## 2013-10-04 DIAGNOSIS — F329 Major depressive disorder, single episode, unspecified: Secondary | ICD-10-CM | POA: Insufficient documentation

## 2013-10-04 DIAGNOSIS — J449 Chronic obstructive pulmonary disease, unspecified: Secondary | ICD-10-CM | POA: Insufficient documentation

## 2013-10-04 DIAGNOSIS — H2589 Other age-related cataract: Secondary | ICD-10-CM | POA: Insufficient documentation

## 2013-10-04 DIAGNOSIS — Z79899 Other long term (current) drug therapy: Secondary | ICD-10-CM | POA: Insufficient documentation

## 2013-10-04 DIAGNOSIS — Z7982 Long term (current) use of aspirin: Secondary | ICD-10-CM | POA: Insufficient documentation

## 2013-10-04 DIAGNOSIS — F3289 Other specified depressive episodes: Secondary | ICD-10-CM | POA: Insufficient documentation

## 2013-10-04 DIAGNOSIS — J4489 Other specified chronic obstructive pulmonary disease: Secondary | ICD-10-CM | POA: Insufficient documentation

## 2013-10-04 DIAGNOSIS — I10 Essential (primary) hypertension: Secondary | ICD-10-CM | POA: Insufficient documentation

## 2013-10-04 DIAGNOSIS — Z87891 Personal history of nicotine dependence: Secondary | ICD-10-CM | POA: Insufficient documentation

## 2013-10-04 DIAGNOSIS — Z95810 Presence of automatic (implantable) cardiac defibrillator: Secondary | ICD-10-CM | POA: Insufficient documentation

## 2013-10-04 HISTORY — PX: CATARACT EXTRACTION W/PHACO: SHX586

## 2013-10-04 SURGERY — PHACOEMULSIFICATION, CATARACT, WITH IOL INSERTION
Anesthesia: Monitor Anesthesia Care | Site: Eye | Laterality: Left

## 2013-10-04 MED ORDER — EPINEPHRINE HCL 1 MG/ML IJ SOLN
INTRAMUSCULAR | Status: AC
  Filled 2013-10-04: qty 1

## 2013-10-04 MED ORDER — TOBRAMYCIN-DEXAMETHASONE 0.3-0.1 % OP SUSP
1.0000 [drp] | Freq: Once | OPHTHALMIC | Status: AC
  Administered 2013-10-04: 1 [drp] via OPHTHALMIC
  Filled 2013-10-04: qty 2.5

## 2013-10-04 MED ORDER — PHENYLEPHRINE HCL 2.5 % OP SOLN
1.0000 [drp] | OPHTHALMIC | Status: AC
  Administered 2013-10-04 (×3): 1 [drp] via OPHTHALMIC

## 2013-10-04 MED ORDER — FENTANYL CITRATE 0.05 MG/ML IJ SOLN
INTRAMUSCULAR | Status: AC
  Filled 2013-10-04: qty 2

## 2013-10-04 MED ORDER — LIDOCAINE HCL 3.5 % OP GEL
1.0000 "application " | Freq: Once | OPHTHALMIC | Status: AC
  Administered 2013-10-04: 1 via OPHTHALMIC

## 2013-10-04 MED ORDER — EPINEPHRINE HCL 1 MG/ML IJ SOLN
INTRAOCULAR | Status: DC | PRN
  Administered 2013-10-04: 11:00:00

## 2013-10-04 MED ORDER — LACTATED RINGERS IV SOLN
INTRAVENOUS | Status: DC
  Administered 2013-10-04: 1000 mL via INTRAVENOUS

## 2013-10-04 MED ORDER — MIDAZOLAM HCL 2 MG/2ML IJ SOLN
1.0000 mg | INTRAMUSCULAR | Status: DC | PRN
  Administered 2013-10-04 (×2): 1 mg via INTRAVENOUS

## 2013-10-04 MED ORDER — LIDOCAINE 3.5 % OP GEL OPTIME - NO CHARGE
OPHTHALMIC | Status: DC | PRN
  Administered 2013-10-04: 2 [drp] via OPHTHALMIC

## 2013-10-04 MED ORDER — TETRACAINE HCL 0.5 % OP SOLN
1.0000 [drp] | OPHTHALMIC | Status: AC
  Administered 2013-10-04 (×3): 1 [drp] via OPHTHALMIC

## 2013-10-04 MED ORDER — CYCLOPENTOLATE-PHENYLEPHRINE 0.2-1 % OP SOLN
1.0000 [drp] | OPHTHALMIC | Status: AC
  Administered 2013-10-04 (×3): 1 [drp] via OPHTHALMIC

## 2013-10-04 MED ORDER — ONDANSETRON HCL 4 MG/2ML IJ SOLN: 4.0000 mg | Freq: Once | INTRAMUSCULAR | Status: DC | PRN

## 2013-10-04 MED ORDER — FENTANYL CITRATE 0.05 MG/ML IJ SOLN
25.0000 ug | INTRAMUSCULAR | Status: AC
  Administered 2013-10-04: 25 ug via INTRAVENOUS

## 2013-10-04 MED ORDER — PROVISC 10 MG/ML IO SOLN
INTRAOCULAR | Status: DC | PRN
  Administered 2013-10-04: 0.85 mL via INTRAOCULAR

## 2013-10-04 MED ORDER — FENTANYL CITRATE 0.05 MG/ML IJ SOLN: 25.0000 ug | INTRAMUSCULAR | Status: DC | PRN

## 2013-10-04 MED ORDER — BSS IO SOLN
INTRAOCULAR | Status: DC | PRN
  Administered 2013-10-04: 15 mL via INTRAOCULAR

## 2013-10-04 MED ORDER — LIDOCAINE HCL (PF) 1 % IJ SOLN
INTRAMUSCULAR | Status: DC | PRN
  Administered 2013-10-04: .4 mL

## 2013-10-04 MED ORDER — MIDAZOLAM HCL 2 MG/2ML IJ SOLN
INTRAMUSCULAR | Status: AC
  Filled 2013-10-04: qty 2

## 2013-10-04 SURGICAL SUPPLY — 32 items
CAPSULAR TENSION RING-AMO (OPHTHALMIC RELATED) IMPLANT
CLOTH BEACON ORANGE TIMEOUT ST (SAFETY) ×1 IMPLANT
EYE SHIELD UNIVERSAL CLEAR (GAUZE/BANDAGES/DRESSINGS) ×1 IMPLANT
GLOVE BIO SURGEON STRL SZ 6.5 (GLOVE) IMPLANT
GLOVE BIOGEL PI IND STRL 6.5 (GLOVE) IMPLANT
GLOVE BIOGEL PI IND STRL 7.0 (GLOVE) IMPLANT
GLOVE BIOGEL PI IND STRL 7.5 (GLOVE) IMPLANT
GLOVE BIOGEL PI INDICATOR 6.5 (GLOVE) ×1
GLOVE BIOGEL PI INDICATOR 7.0 (GLOVE)
GLOVE BIOGEL PI INDICATOR 7.5 (GLOVE)
GLOVE ECLIPSE 6.5 STRL STRAW (GLOVE) IMPLANT
GLOVE ECLIPSE 7.0 STRL STRAW (GLOVE) IMPLANT
GLOVE ECLIPSE 7.5 STRL STRAW (GLOVE) IMPLANT
GLOVE EXAM NITRILE LRG STRL (GLOVE) IMPLANT
GLOVE EXAM NITRILE MD LF STRL (GLOVE) IMPLANT
GLOVE SKINSENSE NS SZ6.5 (GLOVE)
GLOVE SKINSENSE NS SZ7.0 (GLOVE)
GLOVE SKINSENSE STRL SZ6.5 (GLOVE) IMPLANT
GLOVE SKINSENSE STRL SZ7.0 (GLOVE) IMPLANT
GLOVE SS N UNI LF 7.0 STRL (GLOVE) ×1 IMPLANT
KIT VITRECTOMY (OPHTHALMIC RELATED) IMPLANT
PAD ARMBOARD 7.5X6 YLW CONV (MISCELLANEOUS) ×1 IMPLANT
PROC W NO LENS (INTRAOCULAR LENS)
PROC W SPEC LENS (INTRAOCULAR LENS)
PROCESS W NO LENS (INTRAOCULAR LENS) IMPLANT
PROCESS W SPEC LENS (INTRAOCULAR LENS) IMPLANT
RING MALYGIN (MISCELLANEOUS) IMPLANT
SIGHTPATH CAT PROC W REG LENS (Ophthalmic Related) ×2 IMPLANT
SYR TB 1ML LL NO SAFETY (SYRINGE) ×1 IMPLANT
TAPE PAPER 3X10 WHT MICROPORE (GAUZE/BANDAGES/DRESSINGS) ×1 IMPLANT
VISCOELASTIC ADDITIONAL (OPHTHALMIC RELATED) IMPLANT
WATER STERILE IRR 250ML POUR (IV SOLUTION) ×1 IMPLANT

## 2013-10-04 NOTE — Transfer of Care (Signed)
Immediate Anesthesia Transfer of Care Note  Patient: [name]  Procedure(s) Performed: Procedure(s) with comments: CATARACT EXTRACTION PHACO AND INTRAOCULAR LENS PLACEMENT (IOC) (Left) - CDE 9.37  Patient Location: Short Stay  Anesthesia Type:MAC  Level of Consciousness: awake  Airway & Oxygen Therapy: Patient Spontanous Breathing  Post-op Assessment: Report given to PACU RN  Post vital signs: Reviewed  Complications: No apparent anesthesia complications

## 2013-10-04 NOTE — Anesthesia Preprocedure Evaluation (Signed)
Anesthesia Evaluation  Patient identified by MRN, date of birth, ID band Patient awake    Reviewed: Allergy & Precautions, H&P , NPO status , Patient's Chart, lab work & pertinent test results  Airway Mallampati: II TM Distance: >3 FB     Dental  (+) Edentulous Upper, Edentulous Lower   Pulmonary COPDformer smoker,  breath sounds clear to auscultation        Cardiovascular hypertension, Pt. on medications + CAD, + Past MI and + DOE + Cardiac Defibrillator Rhythm:Regular Rate:Normal     Neuro/Psych PSYCHIATRIC DISORDERS Depression    GI/Hepatic negative GI ROS,   Endo/Other  Hypothyroidism   Renal/GU      Musculoskeletal   Abdominal   Peds  Hematology   Anesthesia Other Findings   Reproductive/Obstetrics                           Anesthesia Physical Anesthesia Plan  ASA: III  Anesthesia Plan: MAC   Post-op Pain Management:    Induction: Intravenous  Airway Management Planned: Nasal Cannula  Additional Equipment:   Intra-op Plan:   Post-operative Plan:   Informed Consent: I have reviewed the patients History and Physical, chart, labs and discussed the procedure including the risks, benefits and alternatives for the proposed anesthesia with the patient or authorized representative who has indicated his/her understanding and acceptance.     Plan Discussed with:   Anesthesia Plan Comments:         Anesthesia Quick Evaluation  

## 2013-10-04 NOTE — Discharge Instructions (Signed)

## 2013-10-04 NOTE — Anesthesia Postprocedure Evaluation (Signed)
  Anesthesia Post-op Note  Patient: [name]  Procedure(s) Performed: Procedure(s) with comments: CATARACT EXTRACTION PHACO AND INTRAOCULAR LENS PLACEMENT (IOC) (Left) - CDE 9.37  Patient Location: Short Stay  Anesthesia Type:MAC  Level of Consciousness: awake  Airway and Oxygen Therapy: Patient Spontanous Breathing  Post-op Pain: none  Post-op Assessment: Post-op Vital signs reviewed, Patient's Cardiovascular Status Stable, Respiratory Function Stable, Patent Airway and No signs of Nausea or vomiting  Post-op Vital Signs: Reviewed and stable  Complications: No apparent anesthesia complications

## 2013-10-04 NOTE — H&P (Signed)
I have reviewed the H&P, the patient was re-examined, and I have identified no interval changes in medical condition and plan of care since the history and physical of record  

## 2013-10-04 NOTE — Op Note (Signed)
Date of Admission: 10/04/2013  Date of Surgery: 10/04/2013  Pre-Op Dx: Cataract Left  Eye  Post-Op Dx: Combined Cataract  Left  Eye,  Dx Code 366.19  Surgeon: Gemma PayorKerry Dianelly Ferran, M.D.  Assistants: None  Anesthesia: Topical with MAC  Indications: Painless, progressive loss of vision with compromise of daily activities.  Surgery: Cataract Extraction with Intraocular lens Implant Left Eye  Discription: The patient had dilating drops and viscous lidocaine placed into the Left eye in the pre-op holding area. After transfer to the operating room, a time out was performed. The patient was then prepped and draped. Beginning with a 75 degree blade a paracentesis port was made at the surgeon's 2 o'clock position. The anterior chamber was then filled with 1% non-preserved lidocaine. This was followed by filling the anterior chamber with Provisc.  A 2.314mm keratome blade was used to make a clear corneal incision at the temporal limbus.  A bent cystatome needle was used to create a continuous tear capsulotomy. Hydrodissection was performed with balanced salt solution on a Fine canula. The lens nucleus was then removed using the phacoemulsification handpiece. Residual cortex was removed with the I&A handpiece. The anterior chamber and capsular bag were refilled with Provisc. A posterior chamber intraocular lens was placed into the capsular bag with it's injector. The implant was positioned with the Kuglan hook. The Provisc was then removed from the anterior chamber and capsular bag with the I&A handpiece. Stromal hydration of the main incision and paracentesis port was performed with BSS on a Fine canula. The wounds were tested for leak which was negative. The patient tolerated the procedure well. There were no operative complications. The patient was then transferred to the recovery room in stable condition.  Complications: None  Specimen: None  EBL: None  Prosthetic device: B&L enVista, MX60, power 19.5D, SN  [id number].

## 2013-10-05 ENCOUNTER — Encounter (HOSPITAL_COMMUNITY): Payer: Self-pay | Admitting: Ophthalmology

## 2013-10-11 NOTE — Progress Notes (Signed)
ICD remote 

## 2013-10-12 ENCOUNTER — Encounter (HOSPITAL_COMMUNITY): Payer: Self-pay | Admitting: Pharmacy Technician

## 2013-10-13 ENCOUNTER — Encounter: Payer: Self-pay | Admitting: *Deleted

## 2013-10-18 ENCOUNTER — Encounter (HOSPITAL_COMMUNITY): Payer: Self-pay

## 2013-10-18 ENCOUNTER — Encounter (HOSPITAL_COMMUNITY)
Admission: RE | Admit: 2013-10-18 | Discharge: 2013-10-18 | Disposition: A | Discharge status: Home / Self Care | Payer: Medicare Other | Source: Ambulatory Visit | Attending: Ophthalmology | Admitting: Ophthalmology

## 2013-10-21 ENCOUNTER — Encounter: Payer: Self-pay | Admitting: Internal Medicine

## 2013-10-22 MED ORDER — NEOMYCIN-POLYMYXIN-DEXAMETH 3.5-10000-0.1 OP SUSP
OPHTHALMIC | Status: AC
  Filled 2013-10-22: qty 5

## 2013-10-22 MED ORDER — CYCLOPENTOLATE-PHENYLEPHRINE OP SOLN OPTIME - NO CHARGE
OPHTHALMIC | Status: AC
  Filled 2013-10-22: qty 2

## 2013-10-22 MED ORDER — LIDOCAINE HCL (PF) 1 % IJ SOLN
INTRAMUSCULAR | Status: AC
  Filled 2013-10-22: qty 2

## 2013-10-22 MED ORDER — PHENYLEPHRINE HCL 2.5 % OP SOLN
OPHTHALMIC | Status: AC
  Filled 2013-10-22: qty 15

## 2013-10-22 MED ORDER — TETRACAINE HCL 0.5 % OP SOLN
OPHTHALMIC | Status: AC
  Filled 2013-10-22: qty 2

## 2013-10-22 MED ORDER — LIDOCAINE HCL 3.5 % OP GEL
OPHTHALMIC | Status: AC
  Filled 2013-10-22: qty 1

## 2013-10-25 ENCOUNTER — Ambulatory Visit (HOSPITAL_COMMUNITY): Payer: Medicare Other | Admitting: Anesthesiology

## 2013-10-25 ENCOUNTER — Encounter (HOSPITAL_COMMUNITY)
Admission: RE | Disposition: A | Discharge status: Home / Self Care | Payer: Self-pay | Source: Ambulatory Visit | Attending: Ophthalmology

## 2013-10-25 ENCOUNTER — Ambulatory Visit (HOSPITAL_COMMUNITY)
Admission: RE | Admit: 2013-10-25 | Discharge: 2013-10-25 | Disposition: A | Discharge status: Home / Self Care | Payer: Medicare Other | Source: Ambulatory Visit | Attending: Ophthalmology | Admitting: Ophthalmology

## 2013-10-25 ENCOUNTER — Encounter (HOSPITAL_COMMUNITY): Payer: Medicare Other | Admitting: Anesthesiology

## 2013-10-25 ENCOUNTER — Encounter (HOSPITAL_COMMUNITY): Payer: Self-pay | Admitting: *Deleted

## 2013-10-25 DIAGNOSIS — I10 Essential (primary) hypertension: Secondary | ICD-10-CM | POA: Insufficient documentation

## 2013-10-25 DIAGNOSIS — J4489 Other specified chronic obstructive pulmonary disease: Secondary | ICD-10-CM | POA: Insufficient documentation

## 2013-10-25 DIAGNOSIS — F329 Major depressive disorder, single episode, unspecified: Secondary | ICD-10-CM | POA: Insufficient documentation

## 2013-10-25 DIAGNOSIS — J449 Chronic obstructive pulmonary disease, unspecified: Secondary | ICD-10-CM | POA: Insufficient documentation

## 2013-10-25 DIAGNOSIS — F3289 Other specified depressive episodes: Secondary | ICD-10-CM | POA: Insufficient documentation

## 2013-10-25 DIAGNOSIS — Z87891 Personal history of nicotine dependence: Secondary | ICD-10-CM | POA: Insufficient documentation

## 2013-10-25 DIAGNOSIS — H2589 Other age-related cataract: Secondary | ICD-10-CM | POA: Insufficient documentation

## 2013-10-25 DIAGNOSIS — Z79899 Other long term (current) drug therapy: Secondary | ICD-10-CM | POA: Insufficient documentation

## 2013-10-25 HISTORY — PX: CATARACT EXTRACTION W/PHACO: SHX586

## 2013-10-25 SURGERY — PHACOEMULSIFICATION, CATARACT, WITH IOL INSERTION
Anesthesia: Monitor Anesthesia Care | Site: Eye | Laterality: Right

## 2013-10-25 MED ORDER — TETRACAINE HCL 0.5 % OP SOLN
1.0000 [drp] | OPHTHALMIC | Status: AC
  Administered 2013-10-25 (×3): 1 [drp] via OPHTHALMIC

## 2013-10-25 MED ORDER — MIDAZOLAM HCL 2 MG/2ML IJ SOLN
INTRAMUSCULAR | Status: AC
  Filled 2013-10-25: qty 2

## 2013-10-25 MED ORDER — POVIDONE-IODINE 5 % OP SOLN
OPHTHALMIC | Status: DC | PRN
  Administered 2013-10-25: 1 via OPHTHALMIC

## 2013-10-25 MED ORDER — LIDOCAINE HCL 3.5 % OP GEL
1.0000 "application " | Freq: Once | OPHTHALMIC | Status: AC
  Administered 2013-10-25: 1 via OPHTHALMIC

## 2013-10-25 MED ORDER — LIDOCAINE HCL (PF) 1 % IJ SOLN
INTRAMUSCULAR | Status: DC | PRN
  Administered 2013-10-25: .5 mL

## 2013-10-25 MED ORDER — LACTATED RINGERS IV SOLN
INTRAVENOUS | Status: DC
  Administered 2013-10-25: 12:00:00 via INTRAVENOUS

## 2013-10-25 MED ORDER — BSS IO SOLN
INTRAOCULAR | Status: DC | PRN
  Administered 2013-10-25: 15 mL via INTRAOCULAR

## 2013-10-25 MED ORDER — FENTANYL CITRATE 0.05 MG/ML IJ SOLN
INTRAMUSCULAR | Status: AC
  Filled 2013-10-25: qty 2

## 2013-10-25 MED ORDER — EPINEPHRINE HCL 1 MG/ML IJ SOLN
INTRAMUSCULAR | Status: AC
  Filled 2013-10-25: qty 1

## 2013-10-25 MED ORDER — LIDOCAINE 3.5 % OP GEL OPTIME - NO CHARGE
OPHTHALMIC | Status: DC | PRN
  Administered 2013-10-25: 1 [drp] via OPHTHALMIC

## 2013-10-25 MED ORDER — CYCLOPENTOLATE-PHENYLEPHRINE 0.2-1 % OP SOLN
1.0000 [drp] | OPHTHALMIC | Status: AC
  Administered 2013-10-25 (×3): 1 [drp] via OPHTHALMIC

## 2013-10-25 MED ORDER — EPINEPHRINE HCL 1 MG/ML IJ SOLN
INTRAOCULAR | Status: DC | PRN
  Administered 2013-10-25: 13:00:00

## 2013-10-25 MED ORDER — PROVISC 10 MG/ML IO SOLN
INTRAOCULAR | Status: DC | PRN
  Administered 2013-10-25: 0.85 mL via INTRAOCULAR

## 2013-10-25 MED ORDER — MIDAZOLAM HCL 2 MG/2ML IJ SOLN
1.0000 mg | INTRAMUSCULAR | Status: DC | PRN
  Administered 2013-10-25: 2 mg via INTRAVENOUS

## 2013-10-25 MED ORDER — PHENYLEPHRINE HCL 2.5 % OP SOLN
1.0000 [drp] | OPHTHALMIC | Status: AC
  Administered 2013-10-25 (×3): 1 [drp] via OPHTHALMIC

## 2013-10-25 MED ORDER — FENTANYL CITRATE 0.05 MG/ML IJ SOLN
25.0000 ug | INTRAMUSCULAR | Status: AC
  Administered 2013-10-25 (×2): 25 ug via INTRAVENOUS

## 2013-10-25 SURGICAL SUPPLY — 32 items

## 2013-10-25 NOTE — Anesthesia Preprocedure Evaluation (Signed)
Anesthesia Evaluation  Patient identified by MRN, date of birth, ID band Patient awake    Reviewed: Allergy & Precautions, H&P , NPO status , Patient's Chart, lab work & pertinent test results  Airway Mallampati: II TM Distance: >3 FB     Dental  (+) Edentulous Upper, Edentulous Lower   Pulmonary COPDformer smoker,  breath sounds clear to auscultation        Cardiovascular hypertension, Pt. on medications + CAD, + Past MI and + DOE + Cardiac Defibrillator Rhythm:Regular Rate:Normal     Neuro/Psych PSYCHIATRIC DISORDERS Depression    GI/Hepatic negative GI ROS,   Endo/Other  Hypothyroidism   Renal/GU      Musculoskeletal   Abdominal   Peds  Hematology   Anesthesia Other Findings   Reproductive/Obstetrics                           Anesthesia Physical Anesthesia Plan  ASA: III  Anesthesia Plan: MAC   Post-op Pain Management:    Induction: Intravenous  Airway Management Planned: Nasal Cannula  Additional Equipment:   Intra-op Plan:   Post-operative Plan:   Informed Consent: I have reviewed the patients History and Physical, chart, labs and discussed the procedure including the risks, benefits and alternatives for the proposed anesthesia with the patient or authorized representative who has indicated his/her understanding and acceptance.     Plan Discussed with:   Anesthesia Plan Comments:         Anesthesia Quick Evaluation

## 2013-10-25 NOTE — Op Note (Signed)
Date of Admission: 10/25/2013  Date of Surgery: 10/25/2013  Pre-Op Dx: Cataract Right  Eye  Post-Op Dx: Combined Cataract  Right  Eye,  Dx Code 366.19  Surgeon: Gemma PayorKerry Aveya Beal, M.D.  Assistants: None  Anesthesia: Topical with MAC  Indications: Painless, progressive loss of vision with compromise of daily activities.  Surgery: Cataract Extraction with Intraocular lens Implant Right Eye  Discription: The patient had dilating drops and viscous lidocaine placed into the Right eye in the pre-op holding area. After transfer to the operating room, a time out was performed. The patient was then prepped and draped. Beginning with a 75 degree blade a paracentesis port was made at the surgeon's 2 o'clock position. The anterior chamber was then filled with 1% non-preserved lidocaine. This was followed by filling the anterior chamber with Provisc.  A 2.154mm keratome blade was used to make a clear corneal incision at the temporal limbus.  A bent cystatome needle was used to create a continuous tear capsulotomy. Hydrodissection was performed with balanced salt solution on a Fine canula. The lens nucleus was then removed using the phacoemulsification handpiece. Residual cortex was removed with the I&A handpiece. The anterior chamber and capsular bag were refilled with Provisc. A posterior chamber intraocular lens was placed into the capsular bag with it's injector. The implant was positioned with the Kuglan hook. The Provisc was then removed from the anterior chamber and capsular bag with the I&A handpiece. Stromal hydration of the main incision and paracentesis port was performed with BSS on a Fine canula. The wounds were tested for leak which was negative. The patient tolerated the procedure well. There were no operative complications. The patient was then transferred to the recovery room in stable condition.  Complications: None  Specimen: None  EBL: None  Prosthetic device: B&L enVista, MX60, power 19.0D, SN  [id number].

## 2013-10-25 NOTE — Transfer of Care (Signed)
Immediate Anesthesia Transfer of Care Note  Patient: [name]  Procedure(s) Performed: Procedure(s) with comments: CATARACT EXTRACTION PHACO AND INTRAOCULAR LENS PLACEMENT (IOC) (Right) - CDE 8.57  Patient Location: Short Stay  Anesthesia Type:MAC  Level of Consciousness: awake, alert , oriented and patient cooperative  Airway & Oxygen Therapy: Patient Spontanous Breathing  Post-op Assessment: Report given to PACU RN, Post -op Vital signs reviewed and stable and Patient moving all extremities  Post vital signs: Reviewed and stable  Complications: No apparent anesthesia complications

## 2013-10-25 NOTE — Discharge Instructions (Signed)

## 2013-10-25 NOTE — Anesthesia Postprocedure Evaluation (Signed)
  Anesthesia Post-op Note  Patient: [name]  Procedure(s) Performed: Procedure(s) with comments: CATARACT EXTRACTION PHACO AND INTRAOCULAR LENS PLACEMENT (IOC) (Right) - CDE 8.57  Patient Location: Short Stay  Anesthesia Type:MAC  Level of Consciousness: awake, alert , oriented and patient cooperative  Airway and Oxygen Therapy: Patient Spontanous Breathing  Post-op Pain: none  Post-op Assessment: Post-op Vital signs reviewed, Patient's Cardiovascular Status Stable, Respiratory Function Stable and Pain level controlled  Post-op Vital Signs: Reviewed and stable  Last Vitals:  Filed Vitals:   10/25/13 1230  BP:   Pulse:   Temp:   Resp: 17    Complications: No apparent anesthesia complications

## 2013-10-25 NOTE — H&P (Signed)
I have reviewed the H&P, the patient was re-examined, and I have identified no interval changes in medical condition and plan of care since the history and physical of record  

## 2013-10-27 ENCOUNTER — Encounter (HOSPITAL_COMMUNITY): Payer: Self-pay | Admitting: Ophthalmology

## 2013-11-01 ENCOUNTER — Ambulatory Visit (INDEPENDENT_AMBULATORY_CARE_PROVIDER_SITE_OTHER): Payer: Medicare Other | Admitting: Cardiology

## 2013-11-01 ENCOUNTER — Encounter: Payer: Self-pay | Admitting: Cardiology

## 2013-11-01 VITALS — BP 146/63 | HR 64 | Ht 62.0 in | Wt 156.0 lb

## 2013-11-01 DIAGNOSIS — I10 Essential (primary) hypertension: Secondary | ICD-10-CM

## 2013-11-01 DIAGNOSIS — I2510 Atherosclerotic heart disease of native coronary artery without angina pectoris: Secondary | ICD-10-CM

## 2013-11-01 DIAGNOSIS — E782 Mixed hyperlipidemia: Secondary | ICD-10-CM

## 2013-11-01 DIAGNOSIS — I2589 Other forms of chronic ischemic heart disease: Secondary | ICD-10-CM

## 2013-11-01 DIAGNOSIS — I255 Ischemic cardiomyopathy: Secondary | ICD-10-CM

## 2013-11-01 DIAGNOSIS — Z95810 Presence of automatic (implantable) cardiac defibrillator: Secondary | ICD-10-CM

## 2013-11-01 NOTE — Progress Notes (Signed)
Clinical Summary Ms. Abbe AmsterdamHopkins is an 80 y.o.female last seen in August 2014. Followup device check noted in March with normal device function. She reports no progressive shortness of breath or chest pain. Functionally limited by chronic back pain, uses a cane to walk. She denies any falls fortunately.  Lab work in March showed potassium 4.5, BUN 18, creatinine 1.0, hemoglobin 14.8, hematocrit 43.4.  Echocardiogram from January 2013 showed improved LVEF up to 60-65%, mild aortic stenosis.  ECG today shows sinus rhythm with inferior cues, diffuse nonspecific ST-T changes and increased voltage.  She continued to see Dr. Reuel Boomaniel on a regular basis.   Allergies  Allergen Reactions  . Amoxicillin-Pot Clavulanate Other (See Comments)    VOMITING  . Cephalexin   . Cymbalta [Duloxetine Hcl]   . Doxazosin Mesylate     INCONTINENCE   . Gabapentin   . Neomycin   . Nsaids   . Oxycodone Hcl   . Povidone-Iodine   . Prednisone   . Tape     RASH     Current Outpatient Prescriptions  Medication Sig Dispense Refill  . ezetimibe (ZETIA) 10 MG tablet Take 10 mg by mouth daily.      Marland Kitchen. acetaminophen (TYLENOL) 650 MG CR tablet Take 650 mg by mouth every 8 (eight) hours as needed for pain.       Marland Kitchen. aspirin 81 MG tablet Take 81 mg by mouth daily.      . Calcium Carbonate-Vitamin D (CALTRATE 600+D) 600-400 MG-UNIT per tablet Take 1 tablet by mouth daily.       . chlorthalidone (HYGROTON) 25 MG tablet Take 25 mg by mouth daily.       . cloNIDine (CATAPRES) 0.1 MG tablet Take 0.1 mg by mouth at bedtime.       . clopidogrel (PLAVIX) 75 MG tablet Take 75 mg by mouth at bedtime.       Marland Kitchen. eplerenone (INSPRA) 25 MG tablet Take 25 mg by mouth at bedtime.       Marland Kitchen. estradiol (ESTRACE) 1 MG tablet Take 1 mg by mouth at bedtime.        Marland Kitchen. levothyroxine (SYNTHROID) 112 MCG tablet Take 112 mcg by mouth daily.        Marland Kitchen. loratadine (CLARITIN) 10 MG tablet Take 10 mg by mouth daily as needed for allergies.      Marland Kitchen.  losartan (COZAAR) 100 MG tablet Take 100 mg by mouth at bedtime.      . metoprolol (TOPROL-XL) 100 MG 24 hr tablet Take 1/2 tab (50mg ) twice a day      . sertraline (ZOLOFT) 100 MG tablet Take 1/2 tab (50mg ) at bedtime       . vitamin B-12 (CYANOCOBALAMIN) 1000 MCG tablet Take 1,000 mcg by mouth daily.        No current facility-administered medications for this visit.    Past Medical History  Diagnosis Date  . Essential hypertension, benign   . Automatic implantable cardiac defibrillator in situ     Medtronic  . Hyperlipidemia   . Hypothyroidism   . Depression   . Osteopenia   . Coronary atherosclerosis of native coronary artery     Stent to OM3, DES D1 and RCA 3/12  . B12 deficiency   . Spinal stenosis     Chronic back pain  . Ischemic cardiomyopathy     LVEF 35-40%  . Myocardial infarction     May 2006, March 2012    Social History Ms. Ek  reports that she quit smoking about 3 years ago. Her smoking use included Cigarettes. She has a 12.5 pack-year smoking history. She has never used smokeless tobacco. Ms. Abbe AmsterdamHopkins reports that she does not drink alcohol.  Review of Systems No palpitations or dizziness. Stable appetite. No reported bleeding problems. Otherwise as outlined.  Physical Examination Filed Vitals:   11/01/13 1333  BP: 146/63  Pulse: 64   Filed Weights   11/01/13 1333  Weight: 156 lb (70.761 kg)    No acute distress.  HEENT: Conjunctiva and lids normal, oropharynx clear.  Neck: Supple, no elevated JVP or carotid bruits, no thyromegaly.  Lungs: Clear to auscultation, nonlabored breathing at rest.  Cardiac: Regular rate and rhythm, no S3, 2/6 systolic murmur, no pericardial rub.  Abdomen: Soft, nontender, bowel sounds present, no guarding or rebound.  Extremities: No pitting edema, distal pulses 2+.  Skin: Warm and dry.  Musculoskeletal: Kyphosis.   Problem List and Plan   Coronary atherosclerosis of native coronary artery No progressive  angina symptoms on present medical regimen. ECG reviewed as above. Continue observation, followup in 6 months. Encouraged ambulation as tolerated for exercise.  Ischemic cardiomyopathy Normalization of LVEF on medical therapy.  Mixed hyperlipidemia Significant statin intolerance over time, she has tried different agents with Dr. Reuel Boomaniel.  Essential hypertension, benign Blood pressure elevated today. Continue current regimen, sodium restriction. Keep follow up with Dr. Reuel Boomaniel.  ICD - IN SITU Maintain device followup with Dr. Ladona Ridgelaylor.    Jonelle SidleSamuel G. Teshaun Olarte, M.D., F.A.C.C.

## 2013-11-01 NOTE — Assessment & Plan Note (Signed)
No progressive angina symptoms on present medical regimen. ECG reviewed as above. Continue observation, followup in 6 months. Encouraged ambulation as tolerated for exercise.

## 2013-11-01 NOTE — Patient Instructions (Signed)
Your physician wants you to follow-up in: 6 months You will receive a reminder letter in the mail two months in advance. If you don't receive a letter, please call our office to schedule the follow-up appointment.     Your physician recommends that you continue on your current medications as directed. Please refer to the Current Medication list given to you today.      Thank you for choosing [facility] !        

## 2013-11-01 NOTE — Assessment & Plan Note (Signed)
Maintain device followup with Dr. Ladona Ridgelaylor.

## 2013-11-01 NOTE — Assessment & Plan Note (Signed)
Blood pressure elevated today. Continue current regimen, sodium restriction. Keep follow up with Dr. Reuel Boomaniel.

## 2013-11-01 NOTE — Assessment & Plan Note (Signed)
Significant statin intolerance over time, she has tried different agents with Dr. Reuel Boomaniel.

## 2013-11-01 NOTE — Assessment & Plan Note (Signed)
Normalization of LVEF on medical therapy. 

## 2014-01-04 ENCOUNTER — Ambulatory Visit (INDEPENDENT_AMBULATORY_CARE_PROVIDER_SITE_OTHER): Payer: Medicare Other | Admitting: *Deleted

## 2014-01-04 ENCOUNTER — Telehealth: Payer: Self-pay | Admitting: Cardiology

## 2014-01-04 DIAGNOSIS — I255 Ischemic cardiomyopathy: Secondary | ICD-10-CM

## 2014-01-04 DIAGNOSIS — I2589 Other forms of chronic ischemic heart disease: Secondary | ICD-10-CM

## 2014-01-04 LAB — MDC_IDC_ENUM_SESS_TYPE_REMOTE
Battery Voltage: 2.64 V
Brady Statistic RV Percent Paced: 0 %
Date Time Interrogation Session: 20150623161700
HIGH POWER IMPEDANCE MEASURED VALUE: 39 Ohm
Lead Channel Sensing Intrinsic Amplitude: 11.5 mV
Lead Channel Setting Pacing Amplitude: 2.5 V
MDC IDC MSMT LEADCHNL RV IMPEDANCE VALUE: 440 Ohm
MDC IDC SET LEADCHNL RV PACING PULSEWIDTH: 0.4 ms
MDC IDC SET LEADCHNL RV SENSING SENSITIVITY: 0.3 mV
MDC IDC SET ZONE DETECTION INTERVAL: 300 ms
Zone Setting Detection Interval: 240 ms
Zone Setting Detection Interval: 340 ms

## 2014-01-04 NOTE — Progress Notes (Signed)
Remote ICD transmission.   

## 2014-01-04 NOTE — Telephone Encounter (Signed)
Spoke with pt and reminded pt of remote transmission that is due today. Pt verbalized understanding.   

## 2014-01-19 ENCOUNTER — Encounter: Payer: Self-pay | Admitting: Cardiology

## 2014-01-25 ENCOUNTER — Encounter: Payer: Self-pay | Admitting: Internal Medicine

## 2014-02-08 ENCOUNTER — Encounter: Payer: Self-pay | Admitting: Internal Medicine

## 2014-02-25 ENCOUNTER — Other Ambulatory Visit: Payer: Self-pay | Admitting: *Deleted

## 2014-02-25 DIAGNOSIS — I83893 Varicose veins of bilateral lower extremities with other complications: Secondary | ICD-10-CM

## 2014-03-02 ENCOUNTER — Telehealth: Payer: Self-pay | Admitting: Cardiology

## 2014-03-02 ENCOUNTER — Telehealth: Payer: Self-pay | Admitting: *Deleted

## 2014-03-02 NOTE — Telephone Encounter (Signed)
Pt states that's her device has been beeping for a couple days

## 2014-03-02 NOTE — Telephone Encounter (Signed)
Message copied by Melvern SampleATES-LAND, Roald Lukacs M on Wed Mar 02, 2014 12:15 PM ------      Message from: Tenna DelaineBARKER, STACI T      Created: Wed Mar 02, 2014  9:59 AM       Pt called office and says that her device is making beeping noises at 8 am and 8 pm. No symptoms, wanted to know what she needed to do. I seen your name as the most recent who read a transmission in July. Thanks!! Staci  ------

## 2014-03-02 NOTE — Telephone Encounter (Signed)
Requested that pt send manual transmission. Pt verbalized understanding.

## 2014-03-02 NOTE — Telephone Encounter (Signed)
Received transmission. Transmission indicated pt has reached ERI. I called pt and informed her that she has reached ERI. I informed pt that just b/c she has hit this mark does not mean her device will stop. I informed pt that alert will continue for up to 2 weeks and that if this is a issue she can come in office and have alert turned off. Pt verbalized that it is not a issue. Pt agreed to an appt on 03-29-2014 at 10:30 with Dr. Ladona Ridgelaylor at the [address] office. Pt agreed to appt.

## 2014-03-24 NOTE — Telephone Encounter (Signed)
Forwarded message to Burnadette Pop, notes are in epic and Britta Mccreedy spoke with pt, this was all taken care of on 8/19

## 2014-03-24 NOTE — Telephone Encounter (Signed)
Was this taken care of?

## 2014-03-29 ENCOUNTER — Encounter: Payer: Self-pay | Admitting: Internal Medicine

## 2014-03-29 ENCOUNTER — Encounter: Payer: Self-pay | Admitting: *Deleted

## 2014-03-29 ENCOUNTER — Ambulatory Visit (INDEPENDENT_AMBULATORY_CARE_PROVIDER_SITE_OTHER): Payer: Medicare Other | Admitting: Internal Medicine

## 2014-03-29 VITALS — BP 132/84 | HR 57 | Ht 62.0 in | Wt 159.0 lb

## 2014-03-29 DIAGNOSIS — I5022 Chronic systolic (congestive) heart failure: Secondary | ICD-10-CM

## 2014-03-29 DIAGNOSIS — Z95810 Presence of automatic (implantable) cardiac defibrillator: Secondary | ICD-10-CM

## 2014-03-29 DIAGNOSIS — I4901 Ventricular fibrillation: Secondary | ICD-10-CM

## 2014-03-29 DIAGNOSIS — I2510 Atherosclerotic heart disease of native coronary artery without angina pectoris: Secondary | ICD-10-CM

## 2014-03-29 LAB — MDC_IDC_ENUM_SESS_TYPE_INCLINIC
Date Time Interrogation Session: 20150915113258
HIGH POWER IMPEDANCE MEASURED VALUE: 39 Ohm
HIGH POWER IMPEDANCE MEASURED VALUE: 46 Ohm
HIGH POWER IMPEDANCE MEASURED VALUE: 48 Ohm
HIGH POWER IMPEDANCE MEASURED VALUE: 49 Ohm
HIGH POWER IMPEDANCE MEASURED VALUE: 50 Ohm
HIGH POWER IMPEDANCE MEASURED VALUE: 51 Ohm
HIGH POWER IMPEDANCE MEASURED VALUE: 64 Ohm
HIGH POWER IMPEDANCE MEASURED VALUE: 66 Ohm
HIGH POWER IMPEDANCE MEASURED VALUE: 67 Ohm
HIGH POWER IMPEDANCE MEASURED VALUE: 70 Ohm
HIGH POWER IMPEDANCE MEASURED VALUE: 74 Ohm
HIGH POWER IMPEDANCE MEASURED VALUE: 80 Ohm
HighPow Impedance: 46 Ohm
HighPow Impedance: 46 Ohm
HighPow Impedance: 47 Ohm
HighPow Impedance: 49 Ohm
HighPow Impedance: 49 Ohm
HighPow Impedance: 50 Ohm
HighPow Impedance: 51 Ohm
HighPow Impedance: 51 Ohm
HighPow Impedance: 51 Ohm
HighPow Impedance: 51 Ohm
HighPow Impedance: 66 Ohm
HighPow Impedance: 66 Ohm
HighPow Impedance: 66 Ohm
HighPow Impedance: 67 Ohm
HighPow Impedance: 68 Ohm
HighPow Impedance: 69 Ohm
HighPow Impedance: 70 Ohm
HighPow Impedance: 75 Ohm
HighPow Impedance: 79 Ohm
Lead Channel Impedance Value: 416 Ohm
Lead Channel Impedance Value: 424 Ohm
Lead Channel Impedance Value: 424 Ohm
Lead Channel Impedance Value: 424 Ohm
Lead Channel Impedance Value: 432 Ohm
Lead Channel Impedance Value: 456 Ohm
Lead Channel Impedance Value: 464 Ohm
Lead Channel Impedance Value: 472 Ohm
Lead Channel Impedance Value: 576 Ohm
Lead Channel Impedance Value: 576 Ohm
Lead Channel Impedance Value: 624 Ohm
Lead Channel Sensing Intrinsic Amplitude: 10.3 mV
Lead Channel Sensing Intrinsic Amplitude: 10.7 mV
Lead Channel Sensing Intrinsic Amplitude: 11.5 mV
Lead Channel Sensing Intrinsic Amplitude: 11.8 mV
Lead Channel Sensing Intrinsic Amplitude: 11.9 mV
Lead Channel Sensing Intrinsic Amplitude: 11.9 mV
Lead Channel Sensing Intrinsic Amplitude: 11.9 mV
Lead Channel Sensing Intrinsic Amplitude: 13.5 mV
Lead Channel Sensing Intrinsic Amplitude: 9.9 mV
Lead Channel Setting Sensing Sensitivity: 0.3 mV
MDC IDC MSMT BATTERY VOLTAGE: 2.62 V
MDC IDC MSMT LEADCHNL RV IMPEDANCE VALUE: 424 Ohm
MDC IDC MSMT LEADCHNL RV IMPEDANCE VALUE: 432 Ohm
MDC IDC MSMT LEADCHNL RV IMPEDANCE VALUE: 448 Ohm
MDC IDC MSMT LEADCHNL RV IMPEDANCE VALUE: 584 Ohm
MDC IDC MSMT LEADCHNL RV SENSING INTR AMPL: 10.2 mV
MDC IDC MSMT LEADCHNL RV SENSING INTR AMPL: 10.9 mV
MDC IDC MSMT LEADCHNL RV SENSING INTR AMPL: 10.9 mV
MDC IDC MSMT LEADCHNL RV SENSING INTR AMPL: 10.9 mV
MDC IDC MSMT LEADCHNL RV SENSING INTR AMPL: 11.2 mV
MDC IDC MSMT LEADCHNL RV SENSING INTR AMPL: 11.8 mV
MDC IDC SET LEADCHNL RV PACING AMPLITUDE: 2.5 V
MDC IDC SET LEADCHNL RV PACING PULSEWIDTH: 0.4 ms
MDC IDC SET ZONE DETECTION INTERVAL: 340 ms
MDC IDC STAT BRADY RV PERCENT PACED: 0 %
Zone Setting Detection Interval: 240 ms
Zone Setting Detection Interval: 300 ms

## 2014-03-29 NOTE — Assessment & Plan Note (Signed)
Her Medtronic ICD is at [facility] and she has a 6949 lead in place. She has a h/o VF. I have recommended she undergo lead revision and generator change out.

## 2014-03-29 NOTE — Progress Notes (Signed)
HPI [name] rreturns today for followup. She is a 80 year old woman with a history of ventricular fibrillation, and ischemic cardiomyopathy, chronic class II systolic heart failure, status post ICD implantation. In the interim, the patient has been stable from an arrhythmia and cardiac perspective. She is bothered by difficulty walking, and lower leg pain. She has reached ERI. She has a 6949 lead in place.  Allergies  Allergen Reactions  . Nsaids Other (See Comments)    Vomited blood   . Oxycodone Hcl Hives and Rash  . Povidone-Iodine Rash  . Prednisone Nausea Only and Other (See Comments)    FLU SYMPTOMS  . Amoxicillin-Pot Clavulanate Other (See Comments)    VOMITING  . Doxazosin Mesylate Other (See Comments)    INCONTINENCE   . Cephalexin Rash  . Cymbalta [Duloxetine Hcl] Nausea Only and Other (See Comments)    Headache  . Gabapentin Rash  . Neomycin Rash  . Tape Other (See Comments)    Redness     Current Outpatient Prescriptions  Medication Sig Dispense Refill  . acetaminophen (TYLENOL) 650 MG CR tablet Take 650 mg by mouth every 8 (eight) hours as needed for pain.       Marland Kitchen aspirin 81 MG tablet Take 81 mg by mouth daily.      . Calcium Carbonate-Vitamin D (CALTRATE 600+D) 600-400 MG-UNIT per tablet Take 1 tablet by mouth daily.       . chlorthalidone (HYGROTON) 25 MG tablet Take 25 mg by mouth daily.       . cloNIDine (CATAPRES) 0.1 MG tablet Take 0.1 mg by mouth at bedtime.       . clopidogrel (PLAVIX) 75 MG tablet Take 75 mg by mouth at bedtime.       Marland Kitchen eplerenone (INSPRA) 25 MG tablet Take 25 mg by mouth at bedtime.       Marland Kitchen estradiol (ESTRACE) 1 MG tablet Take 1 mg by mouth at bedtime.        Marland Kitchen ezetimibe (ZETIA) 10 MG tablet Take 10 mg by mouth daily.      Marland Kitchen levothyroxine (SYNTHROID) 112 MCG tablet Take 112 mcg by mouth daily.        Marland Kitchen loratadine (CLARITIN) 10 MG tablet Take 10 mg by mouth daily as needed for allergies.      Marland Kitchen losartan (COZAAR) 25 MG tablet Take 25  mg by mouth at bedtime.      . metoprolol (TOPROL-XL) 100 MG 24 hr tablet Take 1/2 tab ( ) twice a day      . sertraline (ZOLOFT) 100 MG tablet Take 1/2 tab ( ) at bedtime       . vitamin B-12 (CYANOCOBALAMIN) 1000 MCG tablet Take 1,000 mcg by mouth daily.        No current facility-administered medications for this visit.     Past Medical History  Diagnosis Date  . Essential hypertension, benign   . Automatic implantable cardiac defibrillator in situ     Medtronic  . Hyperlipidemia   . Hypothyroidism   . Depression   . Osteopenia   . Coronary atherosclerosis of native coronary artery     Stent to OM3, DES D1 and RCA 3/12  . B12 deficiency   . Spinal stenosis     Chronic back pain  . Ischemic cardiomyopathy     LVEF 35-40%  . Myocardial infarction     May 2006, March 2012    ROS:   All systems reviewed and negative except as noted in the  HPI.   Past Surgical History  Procedure Laterality Date  . Icd implant      MEDTRONIC   . Joint replacement    . Replacement total knee Bilateral     Right-April 2006, Left-April 2011  . Tonsillectomy  1952  . Appendectomy  1956  . Abdominal hysterectomy  1971  . Cardiac catheterization      1st Stent-11/26/04, 2nd Stent-09/26/10  . Cataract extraction w/phaco Left 10/04/2013    Procedure: CATARACT EXTRACTION PHACO AND INTRAOCULAR LENS PLACEMENT (IOC);  Surgeon: Gemma PayorKerry Hunt, MD;  Location: [facility];  Service: Ophthalmology;  Laterality: Left;  CDE 9.37  . Cataract extraction w/phaco Right 10/25/2013    Procedure: CATARACT EXTRACTION PHACO AND INTRAOCULAR LENS PLACEMENT (IOC);  Surgeon: Gemma PayorKerry Hunt, MD;  Location: [facility];  Service: Ophthalmology;  Laterality: Right;  CDE 8.57     Family History  Problem Relation Age of Onset  . Stroke Mother   . Stroke Father   . Heart attack Sister   . Heart attack Brother   . Stroke Sister      History   Social History  . Marital Status: Widowed    Spouse Name: N/A    Number of  Children: N/A  . Years of Education: N/A   Occupational History  . RETIRED    Social History Main Topics  . Smoking status: Former Smoker -- 0.50 packs/day for 25 years    Types: Cigarettes    Quit date: 09/13/2010  . Smokeless tobacco: Never Used  . Alcohol Use: No  . Drug Use: No  . Sexual Activity: Not on file   Other Topics Concern  . Not on file   Social History Narrative  . No narrative on file     BP 132/84  Pulse 57  Ht 5\' 2"  (1.575 m)  Wt 159 lb (72.122 kg)  BMI 29.07 kg/m2  Physical Exam:  Well appearing 80 -year-old woman,NAD HEENT: Unremarkable Neck:  7 cm JVD, no thyromegally Back:  No CVA tenderness Lungs:  Clear with no wheezes, rales, or rhonchi. HEART:  Regular rate rhythm, no murmurs, no rubs, no clicks Abd:  soft, positive bowel sounds, no organomegally, no rebound, no guarding Ext:  2 plus pulses, no edema, no cyanosis, no clubbing Skin:  No rashes no nodules Neuro:  CN II through XII intact, motor grossly intact   DEVICE  Normal device function.  See PaceArt for details.   Assess/Plan:

## 2014-03-29 NOTE — Assessment & Plan Note (Signed)
She has had no recurrent episodes of VT or VF. No change in meds.

## 2014-03-29 NOTE — Patient Instructions (Addendum)
See instruction sheet for procedure  Your physician recommends that you schedule a follow-up appointment in: 7-10 days from 04/22/14 in device clinic for wound check

## 2014-03-29 NOTE — Assessment & Plan Note (Signed)
Her symptoms remain class 2. No change in meds. 

## 2014-04-05 ENCOUNTER — Encounter: Payer: Self-pay | Admitting: Internal Medicine

## 2014-04-12 ENCOUNTER — Telehealth: Payer: Self-pay | Admitting: Internal Medicine

## 2014-04-12 NOTE — Telephone Encounter (Signed)
Pt called. Per Nurse Tresa Endokelly pt has a script to get labs [facility] at [facility]. Pt will go to [place] verses coming in office

## 2014-04-15 ENCOUNTER — Encounter (HOSPITAL_COMMUNITY): Payer: Self-pay | Admitting: Pharmacy Technician

## 2014-04-15 ENCOUNTER — Other Ambulatory Visit: Payer: Medicare Other

## 2014-04-15 ENCOUNTER — Other Ambulatory Visit: Payer: Self-pay | Admitting: Internal Medicine

## 2014-04-15 LAB — BASIC METABOLIC PANEL
BUN: 15 mg/dL (ref 6–23)
CO2: 30 mEq/L (ref 19–32)
Calcium: 9.5 mg/dL (ref 8.4–10.5)
Chloride: 102 mEq/L (ref 96–112)
Creat: 1 mg/dL (ref 0.50–1.10)
Glucose, Bld: 83 mg/dL (ref 70–99)
POTASSIUM: 4.2 meq/L (ref 3.5–5.3)
Sodium: 139 mEq/L (ref 135–145)

## 2014-04-15 LAB — CBC
HCT: 41.8 % (ref 36.0–46.0)
Hemoglobin: 14.3 g/dL (ref 12.0–15.0)
MCH: 30.7 pg (ref 26.0–34.0)
MCHC: 34.2 g/dL (ref 30.0–36.0)
MCV: 89.7 fL (ref 78.0–100.0)
PLATELETS: 198 10*3/uL (ref 150–400)
RBC: 4.66 MIL/uL (ref 3.87–5.11)
RDW: 13.8 % (ref 11.5–15.5)
WBC: 8.7 10*3/uL (ref 4.0–10.5)

## 2014-04-21 DIAGNOSIS — I10 Essential (primary) hypertension: Secondary | ICD-10-CM | POA: Diagnosis not present

## 2014-04-21 DIAGNOSIS — I252 Old myocardial infarction: Secondary | ICD-10-CM | POA: Diagnosis not present

## 2014-04-21 DIAGNOSIS — I2510 Atherosclerotic heart disease of native coronary artery without angina pectoris: Secondary | ICD-10-CM | POA: Diagnosis not present

## 2014-04-21 DIAGNOSIS — I255 Ischemic cardiomyopathy: Secondary | ICD-10-CM | POA: Diagnosis not present

## 2014-04-21 DIAGNOSIS — E785 Hyperlipidemia, unspecified: Secondary | ICD-10-CM | POA: Diagnosis not present

## 2014-04-21 DIAGNOSIS — Z87891 Personal history of nicotine dependence: Secondary | ICD-10-CM | POA: Diagnosis not present

## 2014-04-21 DIAGNOSIS — Z7982 Long term (current) use of aspirin: Secondary | ICD-10-CM | POA: Diagnosis not present

## 2014-04-21 DIAGNOSIS — I5022 Chronic systolic (congestive) heart failure: Secondary | ICD-10-CM | POA: Diagnosis not present

## 2014-04-21 DIAGNOSIS — Z8249 Family history of ischemic heart disease and other diseases of the circulatory system: Secondary | ICD-10-CM | POA: Diagnosis not present

## 2014-04-21 DIAGNOSIS — E039 Hypothyroidism, unspecified: Secondary | ICD-10-CM | POA: Diagnosis not present

## 2014-04-21 MED ORDER — VANCOMYCIN HCL IN DEXTROSE 1-5 GM/200ML-% IV SOLN
1000.0000 mg | INTRAVENOUS | Status: DC
  Filled 2014-04-21: qty 200

## 2014-04-21 MED ORDER — SODIUM CHLORIDE 0.9 % IR SOLN
80.0000 mg | Status: DC
  Filled 2014-04-21: qty 2

## 2014-04-22 ENCOUNTER — Ambulatory Visit (HOSPITAL_COMMUNITY)
Admission: RE | Admit: 2014-04-22 | Discharge: 2014-04-23 | Disposition: A | Discharge status: Home / Self Care | Payer: Medicare Other | Source: Ambulatory Visit | Attending: Internal Medicine | Admitting: Internal Medicine

## 2014-04-22 ENCOUNTER — Encounter (HOSPITAL_COMMUNITY)
Admission: RE | Disposition: A | Discharge status: Home / Self Care | Payer: Self-pay | Source: Ambulatory Visit | Attending: Internal Medicine

## 2014-04-22 ENCOUNTER — Encounter (HOSPITAL_COMMUNITY): Payer: Self-pay

## 2014-04-22 DIAGNOSIS — Z95810 Presence of automatic (implantable) cardiac defibrillator: Secondary | ICD-10-CM | POA: Diagnosis present

## 2014-04-22 DIAGNOSIS — Z87891 Personal history of nicotine dependence: Secondary | ICD-10-CM | POA: Insufficient documentation

## 2014-04-22 DIAGNOSIS — I5022 Chronic systolic (congestive) heart failure: Secondary | ICD-10-CM | POA: Diagnosis not present

## 2014-04-22 DIAGNOSIS — Z7982 Long term (current) use of aspirin: Secondary | ICD-10-CM | POA: Insufficient documentation

## 2014-04-22 DIAGNOSIS — E039 Hypothyroidism, unspecified: Secondary | ICD-10-CM | POA: Insufficient documentation

## 2014-04-22 DIAGNOSIS — I4901 Ventricular fibrillation: Secondary | ICD-10-CM

## 2014-04-22 DIAGNOSIS — E782 Mixed hyperlipidemia: Secondary | ICD-10-CM

## 2014-04-22 DIAGNOSIS — I255 Ischemic cardiomyopathy: Secondary | ICD-10-CM | POA: Diagnosis not present

## 2014-04-22 DIAGNOSIS — Z8249 Family history of ischemic heart disease and other diseases of the circulatory system: Secondary | ICD-10-CM | POA: Insufficient documentation

## 2014-04-22 DIAGNOSIS — I2510 Atherosclerotic heart disease of native coronary artery without angina pectoris: Secondary | ICD-10-CM | POA: Insufficient documentation

## 2014-04-22 DIAGNOSIS — I10 Essential (primary) hypertension: Secondary | ICD-10-CM

## 2014-04-22 DIAGNOSIS — Z950 Presence of cardiac pacemaker: Secondary | ICD-10-CM

## 2014-04-22 DIAGNOSIS — I252 Old myocardial infarction: Secondary | ICD-10-CM | POA: Insufficient documentation

## 2014-04-22 DIAGNOSIS — E785 Hyperlipidemia, unspecified: Secondary | ICD-10-CM | POA: Insufficient documentation

## 2014-04-22 HISTORY — PX: IMPLANTABLE CARDIOVERTER DEFIBRILLATOR (ICD) GENERATOR CHANGE: SHX5469

## 2014-04-22 LAB — SURGICAL PCR SCREEN
MRSA, PCR: NEGATIVE
STAPHYLOCOCCUS AUREUS: NEGATIVE

## 2014-04-22 SURGERY — ICD GENERATOR CHANGE
Anesthesia: LOCAL

## 2014-04-22 MED ORDER — VANCOMYCIN HCL IN DEXTROSE 1-5 GM/200ML-% IV SOLN
1000.0000 mg | Freq: Two times a day (BID) | INTRAVENOUS | Status: AC
  Administered 2014-04-22: 1000 mg via INTRAVENOUS
  Filled 2014-04-22: qty 200

## 2014-04-22 MED ORDER — MUPIROCIN 2 % EX OINT
TOPICAL_OINTMENT | Freq: Two times a day (BID) | CUTANEOUS | Status: DC
  Administered 2014-04-22: 1 via NASAL

## 2014-04-22 MED ORDER — ONDANSETRON HCL 4 MG/2ML IJ SOLN: 4.0000 mg | Freq: Four times a day (QID) | INTRAMUSCULAR | Status: DC | PRN

## 2014-04-22 MED ORDER — LEVOTHYROXINE SODIUM 112 MCG PO TABS
112.0000 ug | ORAL_TABLET | Freq: Every day | ORAL | Status: DC
  Administered 2014-04-22 – 2014-04-23 (×2): 112 ug via ORAL
  Filled 2014-04-22 (×3): qty 1

## 2014-04-22 MED ORDER — VITAMIN B-12 1000 MCG PO TABS
1000.0000 ug | ORAL_TABLET | Freq: Every day | ORAL | Status: DC
  Administered 2014-04-22 – 2014-04-23 (×2): 1000 ug via ORAL
  Filled 2014-04-22 (×2): qty 1

## 2014-04-22 MED ORDER — METHYLPREDNISOLONE SODIUM SUCC 125 MG IJ SOLR
INTRAMUSCULAR | Status: AC
  Filled 2014-04-22: qty 2

## 2014-04-22 MED ORDER — VERAPAMIL HCL 2.5 MG/ML IV SOLN
INTRAVENOUS | Status: AC
  Filled 2014-04-22: qty 2

## 2014-04-22 MED ORDER — SODIUM CHLORIDE 0.9 % IV SOLN
INTRAVENOUS | Status: DC
  Administered 2014-04-22: 08:00:00 via INTRAVENOUS

## 2014-04-22 MED ORDER — MIDAZOLAM HCL 2 MG/2ML IJ SOLN
INTRAMUSCULAR | Status: AC
  Filled 2014-04-22: qty 2

## 2014-04-22 MED ORDER — LORATADINE 10 MG PO TABS
10.0000 mg | ORAL_TABLET | Freq: Every day | ORAL | Status: DC | PRN
  Filled 2014-04-22: qty 1

## 2014-04-22 MED ORDER — ACETAMINOPHEN 325 MG PO TABS
325.0000 mg | ORAL_TABLET | ORAL | Status: DC | PRN
Start: 2014-04-22 — End: 2014-04-23
  Administered 2014-04-22: 650 mg via ORAL
  Filled 2014-04-22: qty 2

## 2014-04-22 MED ORDER — ESTRADIOL 1 MG PO TABS
1.0000 mg | ORAL_TABLET | Freq: Every day | ORAL | Status: DC
  Administered 2014-04-22: 1 mg via ORAL
  Filled 2014-04-22 (×2): qty 1

## 2014-04-22 MED ORDER — DIPHENHYDRAMINE HCL 50 MG/ML IJ SOLN
INTRAMUSCULAR | Status: AC
  Filled 2014-04-22: qty 1

## 2014-04-22 MED ORDER — LIDOCAINE HCL (PF) 1 % IJ SOLN
INTRAMUSCULAR | Status: AC
  Filled 2014-04-22: qty 30

## 2014-04-22 MED ORDER — EZETIMIBE 10 MG PO TABS
10.0000 mg | ORAL_TABLET | Freq: Every day | ORAL | Status: DC
  Administered 2014-04-22 – 2014-04-23 (×2): 10 mg via ORAL
  Filled 2014-04-22 (×2): qty 1

## 2014-04-22 MED ORDER — CLOPIDOGREL BISULFATE 75 MG PO TABS
75.0000 mg | ORAL_TABLET | Freq: Every day | ORAL | Status: DC
  Administered 2014-04-22: 75 mg via ORAL
  Filled 2014-04-22: qty 1

## 2014-04-22 MED ORDER — CHLORHEXIDINE GLUCONATE 4 % EX LIQD: 60.0000 mL | Freq: Once | CUTANEOUS | Status: AC

## 2014-04-22 MED ORDER — MUPIROCIN 2 % EX OINT
TOPICAL_OINTMENT | CUTANEOUS | Status: AC
  Filled 2014-04-22: qty 22

## 2014-04-22 MED ORDER — LOSARTAN POTASSIUM 25 MG PO TABS
25.0000 mg | ORAL_TABLET | Freq: Every day | ORAL | Status: DC
  Administered 2014-04-22: 25 mg via ORAL
  Filled 2014-04-22 (×2): qty 1

## 2014-04-22 MED ORDER — CLONIDINE HCL 0.1 MG PO TABS
0.1000 mg | ORAL_TABLET | Freq: Every day | ORAL | Status: DC
  Administered 2014-04-22: 0.1 mg via ORAL
  Filled 2014-04-22 (×2): qty 1

## 2014-04-22 MED ORDER — SPIRONOLACTONE 25 MG PO TABS
25.0000 mg | ORAL_TABLET | Freq: Every day | ORAL | Status: DC
  Administered 2014-04-22 – 2014-04-23 (×2): 25 mg via ORAL
  Filled 2014-04-22 (×2): qty 1

## 2014-04-22 MED ORDER — FENTANYL CITRATE 0.05 MG/ML IJ SOLN
INTRAMUSCULAR | Status: AC
  Filled 2014-04-22: qty 2

## 2014-04-22 MED ORDER — CHLORTHALIDONE 25 MG PO TABS
25.0000 mg | ORAL_TABLET | Freq: Every day | ORAL | Status: DC
  Administered 2014-04-22 – 2014-04-23 (×2): 25 mg via ORAL
  Filled 2014-04-22 (×2): qty 1

## 2014-04-22 NOTE — CV Procedure (Signed)
SURGEON:  Marie BuntingGregg Clary Boulais, MD      PREPROCEDURE DIAGNOSES:   1. Ischemic cardiomyopathy.   2. New York Heart Association class II, heart failure chronically.  3. Prior VF arrest, s[facility]p ICD,[facility] [phone] [id number] lead in place     POSTPROCEDURE DIAGNOSES:  Same as pre-procedure    PROCEDURES:    1. ICD implantation.  2.  Defibrillation threshold testing     INTRODUCTION:  [name] is a 80 y.o. female with an ischemic CM (EF 55%), NYHA Class II CHF, and CAD. She was initially rescucitated from a VF arrest and her ICD implanted at that time has reached ERI. The patient therefore  presents today for ICD generator change, insertion of a new ICD lead due to her old [id number] lead, and removal of her old device, .      DESCRIPTION OF PROCEDURE:  Informed written consent was obtained and the patient was brought to the electrophysiology lab in the fasting state. The patient was adequately sedated with intravenous Versed, and fentanyl as outlined in the nursing report.  The patient's left chest was prepped and draped in the usual sterile fashion by the EP lab staff.  The skin overlying the left deltopectoral region was infiltrated with lidocaine for local analgesia.  A 5-cm incision was made over the left deltopectoral region.  A left subcutaneous defibrillator pocket was fashioned using a combination of sharp and blunt dissection.  Electrocautery was used to assure hemostasis.    RV Lead Placement: The left axillary vein was cannulated with fluoroscopic visualization.  No contrast was required for this endeavor.  Through the left axillary vein, a Medtronic [id number]  (serial # [id number]  ) right ventricular defibrillator lead was advanced with fluoroscopic visualization into the right ventricular apical septal position. The right ventricular lead R-wave measured 12 mV with impedance of 922 ohms and a threshold of 1 volts at 0.5 milliseconds. Electrocautery was used to remove the old ICD and the old [id number] lead was  capped.   The new lead was secured to the pectoralis  fascia using #2 silk suture over the suture sleeves.  The pocket was then  irrigated with copious gentamicin solution.  The new lead was then  connected to a Medtronic Evera (serial  Number [id number]) ICD.  The defibrillator was placed into the  pocket.  The pocket was then closed in 2 layers with 2.0 Vicryl suture  for the subcutaneous and subcuticular layers.  Steri-Strips and a  sterile dressing were then applied.   DFT Testing: Defibrillation Threshold testing was then performed. Ventricular fibrillation was induced with a T shock.  Adequate sensing of ventricular fibrillation was observed with minimal dropout with a programmed sensitivity of 1.282mV.  The patient was successfully defibrillated to sinus rhythm with a single 20 joules shock delivered from the device with an impedance of 68 ohms in a duration of 11 seconds.  The patient remained in sinus rhythm thereafter.  There were no early apparent complications.   CONCLUSIONS:   1. Ischemic cardiomyopathy with chronic New York Heart Association class II heart failure with prior VF  2. Successful ICD implantation with removal of the old Medtronic device, capping of the old [phone] lead and insertion of a new ICD lead.  3. DFT less than or equal to 20 joules.   4. No early apparent complications.   Marie BuntingGregg Jakita Dutkiewicz, MD  9:56 AM 04/22/2014

## 2014-04-22 NOTE — Progress Notes (Signed)
Pt now has orders per Dr Ladona Ridgelaylor. Lt shoulder dressing is unremarkable. Dressing is CDI. Nurse updated to prepare for pt arrival. Pt leaves cath lab holding in stable condition.

## 2014-04-22 NOTE — Progress Notes (Signed)
Waiting for Dr Ladona Ridgelaylor to put orders in for pt. Once orders are received, I will transport pt to room. Pt family updated with room number and pt status.

## 2014-04-22 NOTE — Progress Notes (Signed)
UR Completed Dartagnan Beavers Graves-Bigelow, RN,BSN [phone]  

## 2014-04-22 NOTE — H&P (View-Only) (Signed)
HPI [name] rreturns today for followup. She is a 80 year old woman with a history of ventricular fibrillation, and ischemic cardiomyopathy, chronic class II systolic heart failure, status post ICD implantation. In the interim, the patient has been stable from an arrhythmia and cardiac perspective. She is bothered by difficulty walking, and lower leg pain. She has reached ERI. She has a 6949 lead in place.  Allergies  Allergen Reactions  . Nsaids Other (See Comments)    Vomited blood   . Oxycodone Hcl Hives and Rash  . Povidone-Iodine Rash  . Prednisone Nausea Only and Other (See Comments)    FLU SYMPTOMS  . Amoxicillin-Pot Clavulanate Other (See Comments)    VOMITING  . Doxazosin Mesylate Other (See Comments)    INCONTINENCE   . Cephalexin Rash  . Cymbalta [Duloxetine Hcl] Nausea Only and Other (See Comments)    Headache  . Gabapentin Rash  . Neomycin Rash  . Tape Other (See Comments)    Redness     Current Outpatient Prescriptions  Medication Sig Dispense Refill  . acetaminophen (TYLENOL) 650 MG CR tablet Take 650 mg by mouth every 8 (eight) hours as needed for pain.       Marland Kitchen. aspirin 81 MG tablet Take 81 mg by mouth daily.      . Calcium Carbonate-Vitamin D (CALTRATE 600+D) 600-400 MG-UNIT per tablet Take 1 tablet by mouth daily.       . chlorthalidone (HYGROTON) 25 MG tablet Take 25 mg by mouth daily.       . cloNIDine (CATAPRES) 0.1 MG tablet Take 0.1 mg by mouth at bedtime.       . clopidogrel (PLAVIX) 75 MG tablet Take 75 mg by mouth at bedtime.       Marland Kitchen. eplerenone (INSPRA) 25 MG tablet Take 25 mg by mouth at bedtime.       Marland Kitchen. estradiol (ESTRACE) 1 MG tablet Take 1 mg by mouth at bedtime.        Marland Kitchen. ezetimibe (ZETIA) 10 MG tablet Take 10 mg by mouth daily.      Marland Kitchen. levothyroxine (SYNTHROID) 112 MCG tablet Take 112 mcg by mouth daily.        Marland Kitchen. loratadine (CLARITIN) 10 MG tablet Take 10 mg by mouth daily as needed for allergies.      Marland Kitchen. losartan (COZAAR) 25 MG tablet Take 25  mg by mouth at bedtime.      . metoprolol (TOPROL-XL) 100 MG 24 hr tablet Take 1/2 tab (50mg ) twice a day      . sertraline (ZOLOFT) 100 MG tablet Take 1/2 tab (50mg ) at bedtime       . vitamin B-12 (CYANOCOBALAMIN) 1000 MCG tablet Take 1,000 mcg by mouth daily.        No current facility-administered medications for this visit.     Past Medical History  Diagnosis Date  . Essential hypertension, benign   . Automatic implantable cardiac defibrillator in situ     Medtronic  . Hyperlipidemia   . Hypothyroidism   . Depression   . Osteopenia   . Coronary atherosclerosis of native coronary artery     Stent to OM3, DES D1 and RCA 3/12  . B12 deficiency   . Spinal stenosis     Chronic back pain  . Ischemic cardiomyopathy     LVEF 35-40%  . Myocardial infarction     May 2006, March 2012    ROS:   All systems reviewed and negative except as noted in the  HPI.   Past Surgical History  Procedure Laterality Date  . Icd implant      MEDTRONIC   . Joint replacement    . Replacement total knee Bilateral     Right-April 2006, Left-April 2011  . Tonsillectomy  1952  . Appendectomy  1956  . Abdominal hysterectomy  1971  . Cardiac catheterization      1st Stent-11/26/04, 2nd Stent-09/26/10  . Cataract extraction w/phaco Left 10/04/2013    Procedure: CATARACT EXTRACTION PHACO AND INTRAOCULAR LENS PLACEMENT (IOC);  Surgeon: Gemma PayorKerry Hunt, MD;  Location: [facility];  Service: Ophthalmology;  Laterality: Left;  CDE 9.37  . Cataract extraction w/phaco Right 10/25/2013    Procedure: CATARACT EXTRACTION PHACO AND INTRAOCULAR LENS PLACEMENT (IOC);  Surgeon: Gemma PayorKerry Hunt, MD;  Location: [facility];  Service: Ophthalmology;  Laterality: Right;  CDE 8.57     Family History  Problem Relation Age of Onset  . Stroke Mother   . Stroke Father   . Heart attack Sister   . Heart attack Brother   . Stroke Sister      History   Social History  . Marital Status: Widowed    Spouse Name: N/A    Number of  Children: N/A  . Years of Education: N/A   Occupational History  . RETIRED    Social History Main Topics  . Smoking status: Former Smoker -- 0.50 packs/day for 25 years    Types: Cigarettes    Quit date: 09/13/2010  . Smokeless tobacco: Never Used  . Alcohol Use: No  . Drug Use: No  . Sexual Activity: Not on file   Other Topics Concern  . Not on file   Social History Narrative  . No narrative on file     BP 132/84  Pulse 57  Ht 5\' 2"  (1.575 m)  Wt 159 lb (72.122 kg)  BMI 29.07 kg/m2  Physical Exam:  Well appearing 80 -year-old woman,NAD HEENT: Unremarkable Neck:  7 cm JVD, no thyromegally Back:  No CVA tenderness Lungs:  Clear with no wheezes, rales, or rhonchi. HEART:  Regular rate rhythm, no murmurs, no rubs, no clicks Abd:  soft, positive bowel sounds, no organomegally, no rebound, no guarding Ext:  2 plus pulses, no edema, no cyanosis, no clubbing Skin:  No rashes no nodules Neuro:  CN II through XII intact, motor grossly intact   DEVICE  Normal device function.  See PaceArt for details.   Assess/Plan:

## 2014-04-22 NOTE — Progress Notes (Signed)
Updated pt family on delay for arriving to pt room. They are in the pt room and are awaiting Dr Ladona Ridgelaylor to speak with them about the outcome of the procedure.

## 2014-04-22 NOTE — Interval H&P Note (Signed)
History and Physical Interval Note:  04/22/2014 7:08 AM  [name]  has presented today for surgery, with the diagnosis of eri/chf  The various methods of treatment have been discussed with the patient and family. After consideration of risks, benefits and other options for treatment, the patient has consented to  Procedure(s): ICD GENERATOR CHANGE (N/A) along with ICD lead revision secondary to an indwelling 6949 lead, as a surgical intervention .  The patient's history has been reviewed, patient examined, no change in status, stable for surgery.  I have reviewed the patient's chart and labs.  Questions were answered to the patient's satisfaction.     Leonia ReevesGregg Taylor,M.D.

## 2014-04-23 ENCOUNTER — Ambulatory Visit (HOSPITAL_COMMUNITY): Payer: Medicare Other

## 2014-04-23 DIAGNOSIS — I255 Ischemic cardiomyopathy: Secondary | ICD-10-CM | POA: Diagnosis not present

## 2014-04-23 DIAGNOSIS — Z7982 Long term (current) use of aspirin: Secondary | ICD-10-CM | POA: Diagnosis not present

## 2014-04-23 DIAGNOSIS — I4901 Ventricular fibrillation: Secondary | ICD-10-CM

## 2014-04-23 DIAGNOSIS — I5022 Chronic systolic (congestive) heart failure: Secondary | ICD-10-CM | POA: Diagnosis not present

## 2014-04-23 DIAGNOSIS — E785 Hyperlipidemia, unspecified: Secondary | ICD-10-CM | POA: Diagnosis not present

## 2014-04-23 MED ORDER — METOPROLOL SUCCINATE ER 50 MG PO TB24
50.0000 mg | ORAL_TABLET | Freq: Every day | ORAL | Status: DC
  Filled 2014-04-23: qty 1

## 2014-04-23 MED ORDER — METOPROLOL SUCCINATE ER 50 MG PO TB24: 50.0000 mg | ORAL_TABLET | Freq: Every day | ORAL | Status: DC

## 2014-04-23 NOTE — Progress Notes (Addendum)
   SUBJECTIVE: The patient is doing well today.  At this time, she denies chest pain, shortness of breath, or any new concerns.  . chlorthalidone  25 mg Oral Daily  . cloNIDine  0.1 mg Oral QHS  . clopidogrel  75 mg Oral QHS  . estradiol  1 mg Oral QHS  . ezetimibe  10 mg Oral Daily  . levothyroxine  112 mcg Oral QAC breakfast  . losartan  25 mg Oral QHS  . spironolactone  25 mg Oral Daily  . vitamin B-12  1,000 mcg Oral Daily      OBJECTIVE: Physical Exam: Filed Vitals:   04/22/14 1430 04/22/14 1948 04/23/14 0000 04/23/14 0521  BP: 126/79 124/84  161/73  Pulse: 63 70  71  Temp:  98.1 F (36.7 C)  98.2 F (36.8 C)  TempSrc:  Oral  Oral  Resp:  18 16 18   Height:      Weight:    161 lb 9.6 oz (73.301 kg)  SpO2: 100% 94%  92%    Intake/Output Summary (Last 24 hours) at 04/23/14 0928 Last data filed at 04/23/14 0839  Gross per 24 hour  Intake    300 ml  Output      0 ml  Net    300 ml    Telemetry reveals sinus rhythm  GEN- The patient is well appearing, alert and oriented x 3 today.   Head- normocephalic, atraumatic Eyes-  Sclera clear, conjunctiva pink Ears- hearing intact Oropharynx- clear Neck- supple, no JVP Lymph- no cervical lymphadenopathy Lungs- Clear to ausculation bilaterally, normal work of breathing Heart- Regular rate and rhythm, no murmurs, rubs or gallops, PMI not laterally displaced GI- soft, NT, ND, + BS Extremities- no clubbing, cyanosis, or edema Skin- ICD pocket is without hematoma Neuro- strength and sensation are intact RADIOLOGY: Dg Chest 2 View  04/23/2014   CLINICAL DATA:  Status post pacemaker insertion  EXAM: CHEST  2 VIEW  COMPARISON:  09/23/2010  FINDINGS: The left-sided pacemaker is in good position. The AICD and pacer wires are in good position. No complicating features. The cardiac silhouette, mediastinal and hilar contours are stable. Stable tortuosity and calcification of the thoracic aorta. The lungs are clear. No pleural  effusion or pneumothorax.  IMPRESSION: Pacer wires/ AICD in good position without complicating features.  No acute cardiopulmonary findings.   Electronically Signed   By: Loralie ChampagneMark  Gallerani M.D.   On: 04/23/2014 08:15    ASSESSMENT AND PLAN:  Active Problems:   ICD (implantable cardioverter-defibrillator) in place  1. Prior VF arrest/ ischemic CM/ HTN .doing well s/p ICD system revision Device interrogation is reviewed and normal CXR is reviewed and reveals stable leads, no ptx  Resume home medicines at discharge  Discharge to home today Routine wound care Needs follow-up in the device clinic for [facility]  Follow-up with Dr Ladona Ridgelaylor in [place] months in [place]   Larraine Argo, MD 04/23/2014 9:28 AM

## 2014-04-23 NOTE — Progress Notes (Signed)
Pt discharged home, self care, with family. ICD affected side activity restriction reinforced. Patient demonstrated understanding. Daughter at the bedside at time of D/C.

## 2014-04-23 NOTE — Discharge Summary (Signed)
Physician Discharge Summary  Patient ID: [name] MRN: [id number] DOB/AGE: Jul 28, 1930 80 y.o.  Admit date: 04/22/2014 Discharge date: 04/23/2014  Primary Discharge Diagnosis 1. ICD in situ: S/P Device Change out CONCLUSIONS:  1. Ischemic cardiomyopathy with chronic New York Heart Association class II heart failure with prior VF  2. Successful ICD implantation with removal of the old Medtronic device, capping of the old [id number] lead and insertion of a new ICD lead.  3. DFT less than or equal to 20 joules.  4. No early apparent complications.   ICD 2. Ischemic Cardiomyopathy  Secondary Discharge Diagnosis 1. CAD 2. Hx of VT 3, Hypertension   Primary Cardiologist:  Marie Buntingaylor, Gregg MD  Significant Diagnostic Studies: 1. Ischemic cardiomyopathy with chronic New York Heart Association class II heart failure with prior VF  2. Successful ICD implantation with removal of the old Medtronic device, capping of the old [id number] lead and insertion of a new ICD lead.  3. DFT less than or equal to 20 joules.  4. No early apparent complications.    Hospital Course:    Mrs. is an 80 year old female patient of Dr. Sharrell KuGreg Dunlap with known history of ischemic adenopathy, New York Heart Association class II CHF, and CAD. The patient has a history of V. fib arrest. Her ICD implanted in recent ERI and therefore, the patient presented to to the hospital for ICD generator change and insertion of new ICD lead, and removal of old device.  This was completed by Dr. Sharrell KuGreg Dunlap on 04/22/2014. This was successful without any bleeding, hematoma, or complications. The device was successfully interrogated following the implantation. The patient was seen and examined by Dr. Hillis RangeJames Leola Dunlap on day of discharge and found to be stable. She will continue her current medication regimen. She was taking prior to admission. It was noted that her metoprolol dose was changed to 50 mg daily. The patient will followup for  device clinic. Wound check in 10 days in [address], and see Dr. Ladona Dunlap in 3 months [place] for ongoing management. Post device insertion written instructions provided for the patient  Discharge Exam: Blood pressure 114/56, pulse 57, temperature 98 F (36.7 C), temperature source Oral, resp. rate 16, height 5\' 2"  (1.575 m), weight 161 lb 9.6 oz (73.301 kg), SpO2 96.00%.   Labs:   Lab Results  Component Value Date   WBC 8.7 04/15/2014   HGB 14.3 04/15/2014   HCT 41.8 04/15/2014   MCV 89.7 04/15/2014   PLT 198 04/15/2014   No results found for this basename: NA, K, CL, CO2, BUN, CREATININE, CALCIUM, LABALBU, PROT, BILITOT, ALKPHOS, ALT, AST, GLUCOSE,  in the last 168 hours Lab Results  Component Value Date   CKTOTAL 177 09/24/2010   CKMB 20.4 CRITICAL VALUE NOTED.  VALUE IS CONSISTENT WITH PREVIOUSLY REPORTED AND CALLED VALUE.* 09/24/2010   TROPONINI  Value: 5.68        POSSIBLE MYOCARDIAL ISCHEMIA. SERIAL TESTING RECOMMENDED. CRITICAL VALUE NOTED.  VALUE IS CONSISTENT WITH PREVIOUSLY REPORTED AND CALLED VALUE.* 09/24/2010        Radiology: Dg Chest 2 View  04/23/2014   CLINICAL DATA:  Status post pacemaker insertion  EXAM: CHEST  2 VIEW  COMPARISON:  09/23/2010  FINDINGS: The left-sided pacemaker is in good position. The AICD and pacer wires are in good position. No complicating features. The cardiac silhouette, mediastinal and hilar contours are stable. Stable tortuosity and calcification of the thoracic aorta. The lungs are clear. No pleural effusion  or pneumothorax.  IMPRESSION: Pacer wires/ AICD in good position without complicating features.  No acute cardiopulmonary findings.   Electronically Signed   By: Marie ChampagneMark  Dunlap M.D.   On: 04/23/2014 08:15    FOLLOW UP PLANS AND APPOINTMENTS     Discharge Instructions   Diet - low sodium heart healthy    Complete by:  As directed      Increase activity slowly    Complete by:  As directed             Medication List          acetaminophen 650 MG CR tablet  Commonly known as:  TYLENOL  Take 650 mg by mouth every 8 (eight) hours as needed for pain.     aspirin 81 MG tablet  Take 81 mg by mouth daily.     CALTRATE 600+D 600-400 MG-UNIT per tablet  Generic drug:  Calcium Carbonate-Vitamin D  Take 1 tablet by mouth daily.     chlorthalidone 25 MG tablet  Commonly known as:  HYGROTON  Take 25 mg by mouth daily.     cloNIDine 0.1 MG tablet  Commonly known as:  CATAPRES  Take 0.1 mg by mouth at bedtime.     clopidogrel 75 MG tablet  Commonly known as:  PLAVIX  Take 75 mg by mouth at bedtime.     estradiol 1 MG tablet  Commonly known as:  ESTRACE  Take 1 mg by mouth at bedtime.     ezetimibe 10 MG tablet  Commonly known as:  ZETIA  Take 10 mg by mouth daily.     INSPRA 25 MG tablet  Generic drug:  eplerenone  Take 25 mg by mouth at bedtime.     loratadine 10 MG tablet  Commonly known as:  CLARITIN  Take 10 mg by mouth daily as needed for allergies.     losartan 25 MG tablet  Commonly known as:  COZAAR  Take 25 mg by mouth at bedtime.     metoprolol succinate 50 MG 24 hr tablet  Commonly known as:  TOPROL-XL  Take 1 tablet (50 mg total) by mouth daily. Take with or immediately following a meal.     sertraline 100 MG tablet  Commonly known as:  ZOLOFT  Take 1/2 tab (50mg ) at bedtime     SYNTHROID 112 MCG tablet  Generic drug:  levothyroxine  Take 112 mcg by mouth daily.     vitamin B-12 1000 MCG tablet  Commonly known as:  CYANOCOBALAMIN  Take 1,000 mcg by mouth daily.     Vitamin D 2000 UNITS Caps  Take 2,000 Units by mouth daily.       Follow-up Information   Follow up with [facility]. (10 days for wound check. Our office will call you for appt)       Follow up with Marie BuntingGregg Taylor, MD. (3 months follow up. Our office will call you for appt.)    Specialty:  Cardiology   Contact information:   [address] [place] [id number] [phone]         Time spent with  patient to include physician time: 35 minutes Signed: Bettey MareKathryn M. Lawrence NP  04/23/2014, 1:53 PM Co-Sign MD Marie RangeJames Arnulfo Batson MD

## 2014-04-23 NOTE — Discharge Instructions (Signed)

## 2014-04-29 ENCOUNTER — Other Ambulatory Visit: Payer: Self-pay

## 2014-05-02 ENCOUNTER — Ambulatory Visit (INDEPENDENT_AMBULATORY_CARE_PROVIDER_SITE_OTHER): Payer: Medicare Other | Admitting: *Deleted

## 2014-05-02 DIAGNOSIS — I255 Ischemic cardiomyopathy: Secondary | ICD-10-CM

## 2014-05-02 DIAGNOSIS — I5022 Chronic systolic (congestive) heart failure: Secondary | ICD-10-CM

## 2014-05-02 LAB — MDC_IDC_ENUM_SESS_TYPE_INCLINIC
Battery Remaining Longevity: 137 mo
Battery Voltage: 3.14 V
HIGH POWER IMPEDANCE MEASURED VALUE: 50 Ohm
HighPow Impedance: 171 Ohm
Lead Channel Impedance Value: 570 Ohm
Lead Channel Sensing Intrinsic Amplitude: 12.375 mV
Lead Channel Setting Pacing Amplitude: 3.5 V
Lead Channel Setting Sensing Sensitivity: 0.3 mV
MDC IDC MSMT LEADCHNL RV PACING THRESHOLD AMPLITUDE: 0.5 V
MDC IDC MSMT LEADCHNL RV PACING THRESHOLD PULSEWIDTH: 0.4 ms
MDC IDC MSMT LEADCHNL RV SENSING INTR AMPL: 14 mV
MDC IDC SESS DTM: 20151019121658
MDC IDC SET LEADCHNL RV PACING PULSEWIDTH: 0.4 ms
MDC IDC SET ZONE DETECTION INTERVAL: 240 ms
MDC IDC SET ZONE DETECTION INTERVAL: 300 ms
MDC IDC SET ZONE DETECTION INTERVAL: 340 ms
MDC IDC STAT BRADY RV PERCENT PACED: 0.02 %
Zone Setting Detection Interval: 450 ms

## 2014-05-02 NOTE — Progress Notes (Signed)

## 2014-05-09 ENCOUNTER — Encounter: Payer: Self-pay | Admitting: Vascular Surgery

## 2014-05-10 ENCOUNTER — Ambulatory Visit (HOSPITAL_COMMUNITY)
Admission: RE | Admit: 2014-05-10 | Discharge: 2014-05-10 | Disposition: A | Discharge status: Home / Self Care | Payer: Medicare Other | Source: Ambulatory Visit | Attending: Vascular Surgery | Admitting: Vascular Surgery

## 2014-05-10 ENCOUNTER — Other Ambulatory Visit: Payer: Self-pay | Admitting: Vascular Surgery

## 2014-05-10 ENCOUNTER — Ambulatory Visit (INDEPENDENT_AMBULATORY_CARE_PROVIDER_SITE_OTHER): Payer: Medicare Other | Admitting: Vascular Surgery

## 2014-05-10 ENCOUNTER — Encounter: Payer: Self-pay | Admitting: Vascular Surgery

## 2014-05-10 VITALS — BP 168/85 | HR 64 | Resp 16 | Ht 60.0 in | Wt 158.0 lb

## 2014-05-10 DIAGNOSIS — I83893 Varicose veins of bilateral lower extremities with other complications: Secondary | ICD-10-CM

## 2014-05-10 DIAGNOSIS — I8393 Asymptomatic varicose veins of bilateral lower extremities: Secondary | ICD-10-CM

## 2014-05-10 DIAGNOSIS — I2510 Atherosclerotic heart disease of native coronary artery without angina pectoris: Secondary | ICD-10-CM

## 2014-05-10 DIAGNOSIS — I83899 Varicose veins of unspecified lower extremities with other complications: Secondary | ICD-10-CM | POA: Insufficient documentation

## 2014-05-10 NOTE — Progress Notes (Signed)
Subjective:     Patient ID: Donnie AhoPatty S Davidow, female   DOB: 1931/02/14, 80 y.o.   MRN: [id number]  HPI this 80 year old female is referred for evaluation of bilateral venous abnormalities including spider veins and small varicose veins. She has had these for many years. She has no history of DVT or thrombophlebitis. She denies a history of stasis ulcers or bleeding. She does not wear elastic compression stockings. She does have some itching and burning sensation associated with this. She has a history of coronary artery disease but has not had coronary artery bypass grafting in the past. She denies specific claudication symptoms.  Past Medical History  Diagnosis Date  . Essential hypertension, benign   . Automatic implantable cardiac defibrillator in situ     Medtronic  . Hyperlipidemia   . Hypothyroidism   . Depression   . Osteopenia   . Coronary atherosclerosis of native coronary artery     Stent to OM3, DES D1 and RCA 3/12  . B12 deficiency   . Spinal stenosis     Chronic back pain  . Ischemic cardiomyopathy     LVEF 35-40%  . Myocardial infarction     May 2006, March 2012    History  Substance Use Topics  . Smoking status: Former Smoker -- 0.50 packs/day for 25 years    Types: Cigarettes    Quit date: 09/13/2010  . Smokeless tobacco: Never Used  . Alcohol Use: No    Family History  Problem Relation Age of Onset  . Stroke Mother   . Stroke Father   . Heart attack Sister   . Heart attack Brother   . Stroke Sister     Allergies  Allergen Reactions  . Nsaids Other (See Comments)    Vomited blood   . Oxycodone Hcl Hives and Rash  . Povidone-Iodine Rash  . Prednisone Nausea Only and Other (See Comments)    FLU SYMPTOMS  . Amoxicillin-Pot Clavulanate Other (See Comments)    VOMITING  . Doxazosin Mesylate Other (See Comments)    INCONTINENCE   . Betadine [Povidone Iodine]     rash  . Cephalexin Rash  . Cymbalta [Duloxetine Hcl] Nausea Only and Other (See  Comments)    Headache  . Gabapentin Rash  . Neomycin Rash  . Tape Other (See Comments)    Redness    Current outpatient prescriptions:acetaminophen (TYLENOL) 650 MG CR tablet, Take 650 mg by mouth every 8 (eight) hours as needed for pain. , Disp: , Rfl: ;  aspirin 81 MG tablet, Take 81 mg by mouth daily., Disp: , Rfl: ;  Calcium Carbonate-Vitamin D (CALTRATE 600+D) 600-400 MG-UNIT per tablet, Take 1 tablet by mouth daily. , Disp: , Rfl: ;  chlorthalidone (HYGROTON) 25 MG tablet, Take 25 mg by mouth daily. , Disp: , Rfl:  Cholecalciferol (VITAMIN D) 2000 UNITS CAPS, Take 2,000 Units by mouth daily., Disp: , Rfl: ;  cloNIDine (CATAPRES) 0.1 MG tablet, Take 0.1 mg by mouth at bedtime. , Disp: , Rfl: ;  clopidogrel (PLAVIX) 75 MG tablet, Take 75 mg by mouth at bedtime. , Disp: , Rfl: ;  eplerenone (INSPRA) 25 MG tablet, Take 25 mg by mouth at bedtime. , Disp: , Rfl: ;  estradiol (ESTRACE) 1 MG tablet, Take 1 mg by mouth at bedtime.  , Disp: , Rfl:  ezetimibe (ZETIA) 10 MG tablet, Take 10 mg by mouth daily., Disp: , Rfl: ;  levothyroxine (SYNTHROID) 112 MCG tablet, Take 112 mcg by mouth daily.  ,  Disp: , Rfl: ;  loratadine (CLARITIN) 10 MG tablet, Take 10 mg by mouth daily as needed for allergies., Disp: , Rfl: ;  losartan (COZAAR) 25 MG tablet, Take 100 mg by mouth at bedtime. , Disp: , Rfl:  metoprolol succinate (TOPROL-XL) 50 MG 24 hr tablet, Take 1 tablet (50 mg total) by mouth daily. Take with or immediately following a meal., Disp: 30 tablet, Rfl: 6;  sertraline (ZOLOFT) 100 MG tablet, Take 1/2 tab (50mg ) at bedtime , Disp: , Rfl: ;  vitamin B-12 (CYANOCOBALAMIN) 1000 MCG tablet, Take 1,000 mcg by mouth daily. , Disp: , Rfl:   BP 168/85  Pulse 64  Resp 16  Ht 5' (1.524 m)  Wt 158 lb (71.668 kg)  BMI 30.86 kg/m2  Body mass index is 30.86 kg/(m^2).           Review of Systems patient complaint chronic back pain, weakness in legs, dizziness, dyspnea on exertion, leg pain with walking,  foot pain while lying flat, peripheral neuropathy, patient has in situ implantable defibrillator. Patient has had PTCA and stenting in the past most recently 2012. Currently denies active chest pain. Other systems negative and complete review of systems     Objective:   Physical Exam BP 168/85  Pulse 64  Resp 16  Ht 5' (1.524 m)  Wt 158 lb (71.668 kg)  BMI 30.86 kg/m2  Gen.-alert and oriented x3 in no apparent distress HEENT normal for age Lungs no rhonchi or wheezing Cardiovascular regular rhythm no murmurs carotid pulses 3+ palpable no bruits audible Abdomen soft nontender no palpable masses Musculoskeletal free of  major deformities Skin clear -no rashes Neurologic normal Lower extremities 3+ femoral and dorsalis pedis pulses palpable bilaterally with no edema-diffuse spider veins in both lower extremities. Left leg lateral distal thigh and proximal calf. Right leg anterior thigh and medial thigh. A few small bulging varicosities medial calf area. No hyperpigmentation or ulceration noted.  Today I ordered bilateral venous duplex exam which I reviewed and interpreted. Left leg has no DVT but does have some deep reflux and a spotty amount of superficial reflux in the great saphenous vein. Right leg has no DVT but does have some deep vein reflux and a few areas of reflux in the great saphenous vein but not a large caliber vein.      Assessment:     Bilateral spider veins and small varicosities or reticular veins-mildly symptomatic-no significant gross reflux bilateral right saphenous veins    Plan:     Treatment would consist of foam sclerotherapy if patient would like to proceed. We have discussed this with her.

## 2014-05-11 ENCOUNTER — Encounter: Payer: Self-pay | Admitting: Internal Medicine

## 2014-05-19 NOTE — H&P (Signed)
  ICD Criteria  Current LVEF:55% ;Obtained > 6 months ago.   NYHA Functional Classification: Class II  Heart Failure History:  Yes, Duration of heart failure since onset is > 9 months  Non-Ischemic Dilated Cardiomyopathy History:  No.  Atrial Fibrillation/Atrial Flutter:  No.  Ventricular Tachycardia History:  Yes, Hemodynamic instability present, VT Type:  SVT - Polymorphic.  Cardiac Arrest History:  Yes, This was a Ventricular Tachycardia/Ventricular Fibrillation Arrest. This was NOT a bradycardia arrest.  History of Syndromes with Risk of Sudden Death:  No.  Previous ICD:  Yes, ICD Type:  Single, Reason for ICD:  Secondary, reason for secondary prevention:  Cardiac Arrest.  Electrophysiology Study: No.  Prior MI: Yes, Most recent MI timeframe is > 40 days.  PPM: No.  OSA:  No  Patient Life Expectancy of >=1 year: Yes.  Anticoagulation Therapy:  Patient is NOT on anticoagulation therapy.   Beta Blocker Therapy:  Yes.   Ace Inhibitor/ARB Therapy:  Yes.

## 2014-05-23 ENCOUNTER — Ambulatory Visit (INDEPENDENT_AMBULATORY_CARE_PROVIDER_SITE_OTHER): Payer: Medicare Other | Admitting: Cardiology

## 2014-05-23 ENCOUNTER — Encounter: Payer: Self-pay | Admitting: Cardiology

## 2014-05-23 VITALS — BP 176/90 | HR 55 | Ht 62.0 in | Wt 156.0 lb

## 2014-05-23 DIAGNOSIS — I255 Ischemic cardiomyopathy: Secondary | ICD-10-CM

## 2014-05-23 DIAGNOSIS — Z95810 Presence of automatic (implantable) cardiac defibrillator: Secondary | ICD-10-CM

## 2014-05-23 DIAGNOSIS — R0609 Other forms of dyspnea: Secondary | ICD-10-CM | POA: Insufficient documentation

## 2014-05-23 DIAGNOSIS — R0602 Shortness of breath: Secondary | ICD-10-CM

## 2014-05-23 DIAGNOSIS — I2510 Atherosclerotic heart disease of native coronary artery without angina pectoris: Secondary | ICD-10-CM

## 2014-05-23 NOTE — Assessment & Plan Note (Signed)
Recent generator and lead change per Dr. Ladona Ridgelaylor. Periprocedural chest x-ray showed no obvious complications. Follow-up wound check and device check was normal.

## 2014-05-23 NOTE — Assessment & Plan Note (Signed)
Reports progressive symptoms over the last several months without obvious angina. She has known ischemic heart disease, last intervention being in 2012 as detailed above. As of last structural cardiac testing in 2013 she had normalized her LVEF as well. In light of these symptoms on medical therapy, we will plan to proceed with a Lexiscan Cardiolite for ischemic surveillance, and a follow-up echocardiogram to reassess LVEF. Follow-up arranged.

## 2014-05-23 NOTE — Patient Instructions (Signed)
Your physician wants you to follow-up in: 6 months You will receive a reminder letter in the mail two months in advance. If you don't receive a letter, please call our office to schedule the follow-up appointment.   Your physician recommends that you continue on your current medications as directed. Please refer to the Current Medication list given to you today.    Your physician has requested that you have an echocardiogram. Echocardiography is a painless test that uses sound waves to create images of your heart. It provides your doctor with information about the size and shape of your heart and how well your heart's chambers and valves are working. This procedure takes approximately one hour. There are no restrictions for this procedure.    Your physician has requested that you have a lexiscan myoview. For further information please visit [email or web address]. Please follow instruction sheet, as given.      Thank you for choosing [facility] !         

## 2014-05-23 NOTE — Assessment & Plan Note (Signed)
History of stent placement to the OM 3, DES to the first diagonal and RCA in March 2012. Follow-up ischemic testing arranged.

## 2014-05-23 NOTE — Progress Notes (Signed)
Reason for visit: Shortness of breath, CAD, cardiomyopathy  Clinical Summary [name] is an 80 y.o.female last seen in April of this year. She continues to follow with Dr. Ladona Ridgelaylor, had a recent generator and lead change out in October. Chest x-ray at that time showed good device position with no obvious, palpitations, no acute cardiopulmonary findings. She is referred back to the office by Dr. Trey SailorsMark Roy. She saw him last week for evaluation of lumbar disc disease with leg weakness and pain. As he was exploring her functional capacity, she did mention a progressive sense of shortness of breath particularly with walking inclines and stairs over the last several months. No definite angina symptoms. These symptoms have not escalated since her recent device change.  Lab work from October shows hemoglobin 14.3, platelets 198, potassium 4.2, BUN 15, creatinine 1.0.  Echocardiogram from January 2013 showed improved LVEF up to 60-65%, mild aortic stenosis. She has not had recent ischemic testing, last intervention in 2012 as outlined below.  We reviewed her medications. She reports compliance, no nitroglycerin use. Describes NYHA class 2-3 dyspnea, no chest tightness, no palpitations or syncope. She states she is somewhat unsteady on her feet due to leg weakness and uses a cane. Blood pressure was elevated today, she states that her systolics have been in the 130 to 140 range consistently at home however.  Allergies  Allergen Reactions  . Nsaids Other (See Comments)    Vomited blood   . Oxycodone Hcl Hives and Rash  . Povidone-Iodine Rash  . Prednisone Nausea Only and Other (See Comments)    FLU SYMPTOMS  . Amoxicillin-Pot Clavulanate Other (See Comments)    VOMITING  . Doxazosin Mesylate Other (See Comments)    INCONTINENCE   . Betadine [Povidone Iodine]     rash  . Cephalexin Rash  . Cymbalta [Duloxetine Hcl] Nausea Only and Other (See Comments)    Headache  . Gabapentin Rash  . Neomycin  Rash  . Tape Other (See Comments)    Redness    Current Outpatient Prescriptions  Medication Sig Dispense Refill  . acetaminophen (TYLENOL) 650 MG CR tablet Take 650 mg by mouth every 8 (eight) hours as needed for pain.     Marland Kitchen. aspirin 81 MG tablet Take 81 mg by mouth daily.    . Calcium Carbonate-Vitamin D (CALTRATE 600+D) 600-400 MG-UNIT per tablet Take 1 tablet by mouth daily.     . chlorthalidone (HYGROTON) 25 MG tablet Take 25 mg by mouth daily.     . Cholecalciferol (VITAMIN D) 2000 UNITS CAPS Take 2,000 Units by mouth daily.    . cloNIDine (CATAPRES) 0.1 MG tablet Take 0.1 mg by mouth at bedtime.     . clopidogrel (PLAVIX) 75 MG tablet Take 75 mg by mouth at bedtime.     Marland Kitchen. eplerenone (INSPRA) 25 MG tablet Take 25 mg by mouth at bedtime.     Marland Kitchen. estradiol (ESTRACE) 1 MG tablet Take 1 mg by mouth at bedtime.      Marland Kitchen. ezetimibe (ZETIA) 10 MG tablet Take 10 mg by mouth daily.    Marland Kitchen. levothyroxine (SYNTHROID) 112 MCG tablet Take 112 mcg by mouth daily.      Marland Kitchen. loratadine (CLARITIN) 10 MG tablet Take 10 mg by mouth daily as needed for allergies.    Marland Kitchen. losartan (COZAAR) 25 MG tablet Take 100 mg by mouth at bedtime.     . metoprolol succinate (TOPROL-XL) 50 MG 24 hr tablet Take 1 tablet (50 mg  total) by mouth daily. Take with or immediately following a meal. 30 tablet 6  . sertraline (ZOLOFT) 100 MG tablet Take 1/2 tab (50mg ) at bedtime     . vitamin B-12 (CYANOCOBALAMIN) 1000 MCG tablet Take 1,000 mcg by mouth daily.      No current facility-administered medications for this visit.    Past Medical History  Diagnosis Date  . Essential hypertension, benign   . Automatic implantable cardiac defibrillator in situ     Medtronic  . Hyperlipidemia   . Hypothyroidism   . Depression   . Osteopenia   . Coronary atherosclerosis of native coronary artery     Stent to OM3, DES D1 and RCA 3/12  . B12 deficiency   . Spinal stenosis     Chronic back pain  . Ischemic cardiomyopathy     LVEF 35-40%  .  Myocardial infarction     May 2006, March 2012    Past Surgical History  Procedure Laterality Date  . Cardiac defibrillator placement      Medtronic  . Joint replacement    . Replacement total knee Bilateral     Right-April 2006, Left-April 2011  . Tonsillectomy  1952  . Appendectomy  1956  . Abdominal hysterectomy  1971  . Cataract extraction w/phaco Left 10/04/2013    Procedure: CATARACT EXTRACTION PHACO AND INTRAOCULAR LENS PLACEMENT (IOC);  Surgeon: Gemma Payor, MD;  Location: [facility];  Service: Ophthalmology;  Laterality: Left;  CDE 9.37  . Cataract extraction w/phaco Right 10/25/2013    Procedure: CATARACT EXTRACTION PHACO AND INTRAOCULAR LENS PLACEMENT (IOC);  Surgeon: Gemma Payor, MD;  Location: [facility];  Service: Ophthalmology;  Laterality: Right;  CDE 8.57    Social History [name] reports that she quit smoking about 3 years ago. Her smoking use included Cigarettes. She has a 12.5 pack-year smoking history. She has never used smokeless tobacco. [name] reports that she does not drink alcohol.  Review of Systems Complete review of systems negative except as otherwise outlined in the clinical summary.  Physical Examination Filed Vitals:   05/23/14 1345  BP: 176/90  Pulse: 55   Filed Weights   05/23/14 1345  Weight: 156 lb (70.761 kg)    No acute distress.  HEENT: Conjunctiva and lids normal, oropharynx clear.  Neck: Supple, no elevated JVP or carotid bruits, no thyromegaly.  Thorax: Device pocket site stable, well-healed. Lungs: Clear to auscultation, nonlabored breathing at rest.  Cardiac: Regular rate and rhythm, no S3, 2/6 systolic murmur, no pericardial rub.  Abdomen: Soft, nontender, bowel sounds present, no guarding or rebound.  Extremities: No pitting edema, distal pulses 2+.  Skin: Warm and dry.  Musculoskeletal: Kyphosis.   Problem List and Plan   Exertional dyspnea Reports progressive symptoms over the last several months without  obvious angina. She has known ischemic heart disease, last intervention being in 2012 as detailed above. As of last structural cardiac testing in 2013 she had normalized her LVEF as well. In light of these symptoms on medical therapy, we will plan to proceed with a Lexiscan Cardiolite for ischemic surveillance, and a follow-up echocardiogram to reassess LVEF. Follow-up arranged.  ICD (implantable cardioverter-defibrillator) in place Recent generator and lead change per Dr. Ladona Ridgel. Periprocedural chest x-ray showed no obvious complications. Follow-up wound check and device check was normal.  Ischemic cardiomyopathy As of 2013 LVEF had improved to the range of 60-65%.  Coronary atherosclerosis of native coronary artery History of stent placement to the OM 3, DES to  the first diagonal and RCA in March 2012. Follow-up ischemic testing arranged.    Jonelle SidleSamuel G. Truett Mcfarlan, M.D., F.A.C.C.

## 2014-05-23 NOTE — Assessment & Plan Note (Signed)
As of 2013 LVEF had improved to the range of 60-65%.

## 2014-05-30 ENCOUNTER — Encounter (HOSPITAL_COMMUNITY): Payer: Self-pay

## 2014-05-30 ENCOUNTER — Ambulatory Visit (HOSPITAL_COMMUNITY)
Admission: RE | Admit: 2014-05-30 | Discharge: 2014-05-30 | Disposition: A | Discharge status: Home / Self Care | Payer: Medicare Other | Source: Ambulatory Visit | Attending: Cardiology | Admitting: Cardiology

## 2014-05-30 ENCOUNTER — Encounter (HOSPITAL_COMMUNITY)
Admission: RE | Admit: 2014-05-30 | Discharge: 2014-05-30 | Disposition: A | Discharge status: Home / Self Care | Payer: Medicare Other | Source: Ambulatory Visit | Attending: Cardiology | Admitting: Cardiology

## 2014-05-30 DIAGNOSIS — I2510 Atherosclerotic heart disease of native coronary artery without angina pectoris: Secondary | ICD-10-CM | POA: Insufficient documentation

## 2014-05-30 DIAGNOSIS — I517 Cardiomegaly: Secondary | ICD-10-CM

## 2014-05-30 DIAGNOSIS — I5189 Other ill-defined heart diseases: Secondary | ICD-10-CM | POA: Insufficient documentation

## 2014-05-30 DIAGNOSIS — Z95810 Presence of automatic (implantable) cardiac defibrillator: Secondary | ICD-10-CM | POA: Diagnosis not present

## 2014-05-30 DIAGNOSIS — E785 Hyperlipidemia, unspecified: Secondary | ICD-10-CM | POA: Insufficient documentation

## 2014-05-30 DIAGNOSIS — R0602 Shortness of breath: Secondary | ICD-10-CM | POA: Diagnosis not present

## 2014-05-30 DIAGNOSIS — I10 Essential (primary) hypertension: Secondary | ICD-10-CM | POA: Diagnosis not present

## 2014-05-30 DIAGNOSIS — R0600 Dyspnea, unspecified: Secondary | ICD-10-CM | POA: Diagnosis present

## 2014-05-30 DIAGNOSIS — I255 Ischemic cardiomyopathy: Secondary | ICD-10-CM | POA: Diagnosis not present

## 2014-05-30 DIAGNOSIS — R9439 Abnormal result of other cardiovascular function study: Secondary | ICD-10-CM | POA: Insufficient documentation

## 2014-05-30 DIAGNOSIS — J449 Chronic obstructive pulmonary disease, unspecified: Secondary | ICD-10-CM | POA: Insufficient documentation

## 2014-05-30 DIAGNOSIS — I081 Rheumatic disorders of both mitral and tricuspid valves: Secondary | ICD-10-CM | POA: Diagnosis not present

## 2014-05-30 DIAGNOSIS — Z87891 Personal history of nicotine dependence: Secondary | ICD-10-CM | POA: Diagnosis not present

## 2014-05-30 MED ORDER — TECHNETIUM TC 99M SESTAMIBI - CARDIOLITE
10.0000 | Freq: Once | INTRAVENOUS | Status: AC | PRN
  Administered 2014-05-30: 10:00:00 10 via INTRAVENOUS

## 2014-05-30 MED ORDER — SODIUM CHLORIDE 0.9 % IJ SOLN
INTRAMUSCULAR | Status: AC
  Administered 2014-05-30: 10 mL via INTRAVENOUS
  Filled 2014-05-30: qty 10

## 2014-05-30 MED ORDER — REGADENOSON 0.4 MG/5ML IV SOLN
INTRAVENOUS | Status: AC
  Administered 2014-05-30: 0.4 mg via INTRAVENOUS
  Filled 2014-05-30: qty 5

## 2014-05-30 MED ORDER — TECHNETIUM TC 99M SESTAMIBI GENERIC - CARDIOLITE
30.0000 | Freq: Once | INTRAVENOUS | Status: AC | PRN
  Administered 2014-05-30: 30 via INTRAVENOUS

## 2014-05-30 MED ORDER — REGADENOSON 0.4 MG/5ML IV SOLN
0.4000 mg | Freq: Once | INTRAVENOUS | Status: AC | PRN
  Administered 2014-05-30: 0.4 mg via INTRAVENOUS

## 2014-05-30 MED ORDER — SODIUM CHLORIDE 0.9 % IJ SOLN
10.0000 mL | INTRAMUSCULAR | Status: DC | PRN
  Administered 2014-05-30: 10 mL via INTRAVENOUS
  Filled 2014-05-30: qty 10

## 2014-05-30 MED ORDER — SODIUM CHLORIDE 0.9 % IJ SOLN
INTRAMUSCULAR | Status: AC
  Filled 2014-05-30: qty 18

## 2014-05-30 NOTE — Progress Notes (Signed)
  Echocardiogram 2D Echocardiogram has been performed.  [name] 05/30/2014, 9:19 AM

## 2014-05-30 NOTE — Progress Notes (Signed)
Stress Lab Nurses Notes - [name]  [name] [name] 05/30/2014 Reason for doing test: CAD and SOB Type of test: Marlane HatcherLexiscan Cardiolite Nurse performing test: Marie PoissonPhyllis Billingsly, RN Nuclear Medicine Tech: [name] Echo Tech: Not Applicable MD performing test: [name] Family MD: [name] Test explained and consent signed: Yes.   IV started: Saline lock flushed, No redness or edema and Saline lock started in radiology Symptoms: Dizziness & stomach discomfort Treatment/Intervention: None Reason test stopped: protocol completed After recovery IV was: Discontinued via X-ray tech and No redness or edema Patient to return to Nuc. Med at : 12:30 Patient discharged: Home Patient'Marie Condition upon discharge was: stable Comments: During test BP 147/78 & HR 72 .  Recovery BP 149/79 & HR 68 .  Symptoms resolved in recovery. [name], [name]

## 2014-06-13 ENCOUNTER — Telehealth: Payer: Self-pay | Admitting: *Deleted

## 2014-06-13 NOTE — Telephone Encounter (Signed)
Pt has 3 carelink monitors: 1 for her old device, a new My Carelink [id number], and a new 2490 replacement. I had pt send a manual transmission w/ her [id number] model. Transmission was successfully sent. I updated Medtronic tech svcs w/ accurate [id number] serial number. I also ordered 2 return kits. Pt was thankful to have mishap cleared up.

## 2014-06-23 ENCOUNTER — Encounter (HOSPITAL_COMMUNITY): Payer: Self-pay | Admitting: Internal Medicine

## 2014-07-18 ENCOUNTER — Ambulatory Visit (INDEPENDENT_AMBULATORY_CARE_PROVIDER_SITE_OTHER): Payer: Medicare Other | Admitting: Internal Medicine

## 2014-07-18 ENCOUNTER — Encounter: Payer: Self-pay | Admitting: Internal Medicine

## 2014-07-18 VITALS — BP 156/80 | HR 68 | Ht 62.0 in | Wt 156.0 lb

## 2014-07-18 DIAGNOSIS — Z95810 Presence of automatic (implantable) cardiac defibrillator: Secondary | ICD-10-CM

## 2014-07-18 DIAGNOSIS — I5022 Chronic systolic (congestive) heart failure: Secondary | ICD-10-CM

## 2014-07-18 DIAGNOSIS — I10 Essential (primary) hypertension: Secondary | ICD-10-CM

## 2014-07-18 DIAGNOSIS — I255 Ischemic cardiomyopathy: Secondary | ICD-10-CM

## 2014-07-18 LAB — MDC_IDC_ENUM_SESS_TYPE_INCLINIC
Battery Voltage: 3.15 V
Brady Statistic RV Percent Paced: 0.03 %
Date Time Interrogation Session: 20160104101657
HIGH POWER IMPEDANCE MEASURED VALUE: 190 Ohm
HighPow Impedance: 64 Ohm
Lead Channel Pacing Threshold Amplitude: 0.5 V
Lead Channel Pacing Threshold Pulse Width: 0.4 ms
Lead Channel Sensing Intrinsic Amplitude: 18.5 mV
Lead Channel Sensing Intrinsic Amplitude: 20.25 mV
Lead Channel Setting Sensing Sensitivity: 0.3 mV
MDC IDC MSMT BATTERY REMAINING LONGEVITY: 137 mo
MDC IDC MSMT LEADCHNL RV IMPEDANCE VALUE: 722 Ohm
MDC IDC SET LEADCHNL RV PACING AMPLITUDE: 2 V
MDC IDC SET LEADCHNL RV PACING PULSEWIDTH: 0.4 ms
MDC IDC SET ZONE DETECTION INTERVAL: 450 ms
Zone Setting Detection Interval: 240 ms
Zone Setting Detection Interval: 300 ms
Zone Setting Detection Interval: 340 ms

## 2014-07-18 NOTE — Assessment & Plan Note (Signed)
Her symptoms are class 2. No change in medical therapy. I have encouraged the patient to increase her physical activity. She has a stationary bike at home.

## 2014-07-18 NOTE — Patient Instructions (Addendum)
Remote monitoring is used to monitor your Pacemaker of ICD from home. This monitoring reduces the number of office visits required to check your device to one time per year. It allows [place] to keep an eye on the functioning of your device to ensure it is working properly. You are scheduled for a device check from home on April 4TH. You may send your transmission at any time that day. If you have a wireless device, the transmission will be sent automatically. After your physician reviews your transmission, you will receive a postcard with your next transmission date.  Your physician wants you to follow-up in: October with Dr. Court Joy will receive a reminder letter in the mail two months in advance. If you don't receive a letter, please call our office to schedule the follow-up appointment.  Your physician recommends that you continue on your current medications as directed. Please refer to the Current Medication list given to you today.  Thank you for choosing [facility]!!

## 2014-07-18 NOTE — Assessment & Plan Note (Signed)
Her Medtronic ICD is working normally; No ICD therapies. Will recheck in several months.

## 2014-07-18 NOTE — Progress Notes (Signed)
HPI [name] rreturns today for followup. She is a 81 year old woman with a history of ventricular fibrillation, and ischemic cardiomyopathy, chronic class II systolic heart failure, status post ICD implantation. She underwent ICD generator change out several months ago. She is bothered by difficulty walking, and lower leg pain. No worsening heart failure symptoms. No ICD shocks. Allergies  Allergen Reactions  . Nsaids Other (See Comments)    Vomited blood   . Oxycodone Hcl Hives and Rash  . Povidone-Iodine Rash  . Prednisone Nausea Only and Other (See Comments)    FLU SYMPTOMS  . Amoxicillin-Pot Clavulanate Other (See Comments)    VOMITING  . Doxazosin Mesylate Other (See Comments)    INCONTINENCE   . Betadine [Povidone Iodine]     rash  . Cephalexin Rash  . Cymbalta [Duloxetine Hcl] Nausea Only and Other (See Comments)    Headache  . Gabapentin Rash  . Neomycin Rash  . Tape Other (See Comments)    Redness     Current Outpatient Prescriptions  Medication Sig Dispense Refill  . acetaminophen (TYLENOL) 650 MG CR tablet Take 650 mg by mouth every 8 (eight) hours as needed for pain.     Marland Kitchen aspirin 81 MG tablet Take 81 mg by mouth daily.    . Calcium Carbonate-Vitamin D (CALTRATE 600+D) 600-400 MG-UNIT per tablet Take 1 tablet by mouth daily.     . chlorthalidone (HYGROTON) 25 MG tablet Take 25 mg by mouth daily.     . Cholecalciferol (VITAMIN D) 2000 UNITS CAPS Take 2,000 Units by mouth daily.    . cloNIDine (CATAPRES) 0.1 MG tablet Take 0.1 mg by mouth at bedtime.     . clopidogrel (PLAVIX) 75 MG tablet Take 75 mg by mouth at bedtime.     Marland Kitchen eplerenone (INSPRA) 25 MG tablet Take 25 mg by mouth at bedtime.     Marland Kitchen estradiol (ESTRACE) 1 MG tablet Take 1 mg by mouth at bedtime.      Marland Kitchen ezetimibe (ZETIA) 10 MG tablet Take 10 mg by mouth daily.    Marland Kitchen levothyroxine (SYNTHROID) 112 MCG tablet Take 112 mcg by mouth daily.      Marland Kitchen loratadine (CLARITIN) 10 MG tablet Take 10 mg by mouth  daily as needed for allergies.    Marland Kitchen losartan (COZAAR) 100 MG tablet Take 100 mg by mouth daily.    . metoprolol succinate (TOPROL-XL) 50 MG 24 hr tablet Take 1 tablet (50 mg total) by mouth daily. Take with or immediately following a meal. 30 tablet 6  . sertraline (ZOLOFT) 100 MG tablet Take 1/2 tab ( ) at bedtime     . vitamin B-12 (CYANOCOBALAMIN) 1000 MCG tablet Take 1,000 mcg by mouth daily.      No current facility-administered medications for this visit.     Past Medical History  Diagnosis Date  . Essential hypertension, benign   . Automatic implantable cardiac defibrillator in situ     Medtronic  . Hyperlipidemia   . Hypothyroidism   . Depression   . Osteopenia   . Coronary atherosclerosis of native coronary artery     Stent to OM3, DES D1 and RCA 3/12  . B12 deficiency   . Spinal stenosis     Chronic back pain  . Ischemic cardiomyopathy     LVEF 35-40%  . Myocardial infarction     May 2006, March 2012    ROS:   All systems reviewed and negative except as noted in the HPI.  Past Surgical History  Procedure Laterality Date  . Cardiac defibrillator placement      Medtronic  . Joint replacement    . Replacement total knee Bilateral     Right-April 2006, Left-April 2011  . Tonsillectomy  1952  . Appendectomy  1956  . Abdominal hysterectomy  1971  . Cataract extraction w/phaco Left 10/04/2013    Procedure: CATARACT EXTRACTION PHACO AND INTRAOCULAR LENS PLACEMENT (IOC);  Surgeon: Gemma Payor, MD;  Location: [facility];  Service: Ophthalmology;  Laterality: Left;  CDE 9.37  . Cataract extraction w/phaco Right 10/25/2013    Procedure: CATARACT EXTRACTION PHACO AND INTRAOCULAR LENS PLACEMENT (IOC);  Surgeon: Gemma Payor, MD;  Location: [facility];  Service: Ophthalmology;  Laterality: Right;  CDE 8.57  . Implantable cardioverter defibrillator (icd) generator change N/A 04/22/2014    Procedure: ICD GENERATOR CHANGE;  Surgeon: Marinus Maw, MD;  Location: [facility] CATH [facility];   Service: Cardiovascular;  Laterality: N/A;     Family History  Problem Relation Age of Onset  . Stroke Mother   . Stroke Father   . Heart attack Sister   . Heart attack Brother   . Stroke Sister      History   Social History  . Marital Status: Widowed    Spouse Name: N/A    Number of Children: N/A  . Years of Education: N/A   Occupational History  . RETIRED    Social History Main Topics  . Smoking status: Former Smoker -- 0.50 packs/day for 25 years    Types: Cigarettes    Quit date: 09/13/2010  . Smokeless tobacco: Never Used  . Alcohol Use: No  . Drug Use: No  . Sexual Activity: Yes    Birth Control/ Protection: Post-menopausal   Other Topics Concern  . Not on file   Social History Narrative     BP 156/80 mmHg  Pulse 68  Ht  (1.575 m)  Wt 156 lb (70.761 kg)  BMI 28.53 kg/m2  SpO2 95%  Physical Exam:  frail appearing 6 -year-old woman,NAD HEENT: Unremarkable Neck:  7 cm JVD, no thyromegally Back:  No CVA tenderness Lungs:  Clear with no wheezes, rales, or rhonchi. HEART:  Regular rate rhythm, no murmurs, no rubs, no clicks Abd:  soft, positive bowel sounds, no organomegally, no rebound, no guarding Ext:  2 plus pulses, no edema, no cyanosis, no clubbing Skin:  No rashes no nodules Neuro:  CN II through XII intact, motor grossly intact   DEVICE  Normal device function.  See PaceArt for details.   Assess/Plan:

## 2014-07-18 NOTE — Assessment & Plan Note (Signed)
Her blood pressure is elevated today. She states that it is much better at home. She is encouraged to reduce her sodium intake.

## 2014-07-19 ENCOUNTER — Encounter: Payer: Self-pay | Admitting: Internal Medicine

## 2014-07-19 LAB — PACEMAKER DEVICE OBSERVATION

## 2014-07-25 ENCOUNTER — Telehealth: Payer: Self-pay | Admitting: Adult Health

## 2014-07-25 NOTE — Telephone Encounter (Signed)
Please call patient regarding medications / tgs  °

## 2014-07-26 ENCOUNTER — Other Ambulatory Visit: Payer: Self-pay

## 2014-07-26 NOTE — Telephone Encounter (Signed)
Pt's pcp Dr.Terry Reuel [place] in [place] has [place] taking Toprol XL 100 mg, 1/2 tab in the am, and 1/2 tab in the pm, not 50 mg daily as was filled by K.lawrence NP  Pt requests all refill for her Toprol go through Dr.Daniel, i spoke with [place], [facility] and she will do as pt wishes

## 2014-10-17 ENCOUNTER — Ambulatory Visit (INDEPENDENT_AMBULATORY_CARE_PROVIDER_SITE_OTHER): Payer: Medicare Other | Admitting: *Deleted

## 2014-10-17 ENCOUNTER — Telehealth: Payer: Self-pay | Admitting: Cardiology

## 2014-10-17 DIAGNOSIS — I5022 Chronic systolic (congestive) heart failure: Secondary | ICD-10-CM | POA: Diagnosis not present

## 2014-10-17 DIAGNOSIS — I255 Ischemic cardiomyopathy: Secondary | ICD-10-CM

## 2014-10-17 LAB — MDC_IDC_ENUM_SESS_TYPE_REMOTE
Battery Remaining Longevity: 135 mo
Battery Voltage: 3.1 V
HIGH POWER IMPEDANCE MEASURED VALUE: 61 Ohm
Lead Channel Impedance Value: 646 Ohm
Lead Channel Impedance Value: 703 Ohm
Lead Channel Pacing Threshold Pulse Width: 0.4 ms
Lead Channel Setting Pacing Amplitude: 2 V
Lead Channel Setting Pacing Pulse Width: 0.4 ms
Lead Channel Setting Sensing Sensitivity: 0.3 mV
MDC IDC MSMT LEADCHNL RV PACING THRESHOLD AMPLITUDE: 0.5 V
MDC IDC MSMT LEADCHNL RV SENSING INTR AMPL: 23.375 mV
MDC IDC MSMT LEADCHNL RV SENSING INTR AMPL: 23.375 mV
MDC IDC SESS DTM: 20160404193729
MDC IDC SET ZONE DETECTION INTERVAL: 300 ms
MDC IDC STAT BRADY RV PERCENT PACED: 0 %
Zone Setting Detection Interval: 240 ms
Zone Setting Detection Interval: 340 ms
Zone Setting Detection Interval: 450 ms

## 2014-10-17 NOTE — Progress Notes (Signed)
Remote ICD transmission.   

## 2014-10-17 NOTE — Telephone Encounter (Signed)
Spoke with pt and reminded pt of remote transmission that is due today. Pt verbalized understanding.   

## 2014-10-20 ENCOUNTER — Other Ambulatory Visit: Payer: Self-pay | Admitting: Internal Medicine

## 2014-10-27 ENCOUNTER — Encounter: Payer: Self-pay | Admitting: Cardiology

## 2014-11-01 ENCOUNTER — Encounter: Payer: Self-pay | Admitting: Internal Medicine

## 2014-11-10 ENCOUNTER — Encounter: Payer: Self-pay | Admitting: Cardiology

## 2014-12-26 ENCOUNTER — Emergency Department (HOSPITAL_COMMUNITY): Payer: Medicare Other

## 2014-12-26 ENCOUNTER — Inpatient Hospital Stay (HOSPITAL_COMMUNITY): Payer: Medicare Other

## 2014-12-26 ENCOUNTER — Inpatient Hospital Stay (HOSPITAL_COMMUNITY)
Admission: EM | Admit: 2014-12-26 | Discharge: 2014-12-29 | DRG: 065 | Disposition: A | Discharge status: Skilled Nursing Facility | Payer: Medicare Other | Attending: Internal Medicine | Admitting: Internal Medicine

## 2014-12-26 ENCOUNTER — Encounter (HOSPITAL_COMMUNITY): Payer: Self-pay | Admitting: Radiology

## 2014-12-26 DIAGNOSIS — Z66 Do not resuscitate: Secondary | ICD-10-CM | POA: Diagnosis present

## 2014-12-26 DIAGNOSIS — I63512 Cerebral infarction due to unspecified occlusion or stenosis of left middle cerebral artery: Secondary | ICD-10-CM | POA: Diagnosis present

## 2014-12-26 DIAGNOSIS — W19XXXA Unspecified fall, initial encounter: Secondary | ICD-10-CM

## 2014-12-26 DIAGNOSIS — I10 Essential (primary) hypertension: Secondary | ICD-10-CM | POA: Diagnosis present

## 2014-12-26 DIAGNOSIS — Z87891 Personal history of nicotine dependence: Secondary | ICD-10-CM | POA: Diagnosis not present

## 2014-12-26 DIAGNOSIS — Z8249 Family history of ischemic heart disease and other diseases of the circulatory system: Secondary | ICD-10-CM | POA: Diagnosis not present

## 2014-12-26 DIAGNOSIS — I5022 Chronic systolic (congestive) heart failure: Secondary | ICD-10-CM | POA: Diagnosis present

## 2014-12-26 DIAGNOSIS — R4701 Aphasia: Secondary | ICD-10-CM | POA: Diagnosis present

## 2014-12-26 DIAGNOSIS — E782 Mixed hyperlipidemia: Secondary | ICD-10-CM | POA: Diagnosis present

## 2014-12-26 DIAGNOSIS — Z79899 Other long term (current) drug therapy: Secondary | ICD-10-CM

## 2014-12-26 DIAGNOSIS — E039 Hypothyroidism, unspecified: Secondary | ICD-10-CM | POA: Diagnosis present

## 2014-12-26 DIAGNOSIS — Z95810 Presence of automatic (implantable) cardiac defibrillator: Secondary | ICD-10-CM

## 2014-12-26 DIAGNOSIS — Z823 Family history of stroke: Secondary | ICD-10-CM

## 2014-12-26 DIAGNOSIS — I252 Old myocardial infarction: Secondary | ICD-10-CM | POA: Diagnosis not present

## 2014-12-26 DIAGNOSIS — Z955 Presence of coronary angioplasty implant and graft: Secondary | ICD-10-CM

## 2014-12-26 DIAGNOSIS — I255 Ischemic cardiomyopathy: Secondary | ICD-10-CM | POA: Diagnosis present

## 2014-12-26 DIAGNOSIS — I639 Cerebral infarction, unspecified: Secondary | ICD-10-CM | POA: Diagnosis not present

## 2014-12-26 DIAGNOSIS — Z7982 Long term (current) use of aspirin: Secondary | ICD-10-CM | POA: Diagnosis not present

## 2014-12-26 DIAGNOSIS — I63312 Cerebral infarction due to thrombosis of left middle cerebral artery: Secondary | ICD-10-CM | POA: Diagnosis not present

## 2014-12-26 DIAGNOSIS — Z7902 Long term (current) use of antithrombotics/antiplatelets: Secondary | ICD-10-CM

## 2014-12-26 DIAGNOSIS — R4182 Altered mental status, unspecified: Secondary | ICD-10-CM | POA: Diagnosis present

## 2014-12-26 DIAGNOSIS — E119 Type 2 diabetes mellitus without complications: Secondary | ICD-10-CM | POA: Diagnosis present

## 2014-12-26 DIAGNOSIS — I2510 Atherosclerotic heart disease of native coronary artery without angina pectoris: Secondary | ICD-10-CM | POA: Diagnosis present

## 2014-12-26 DIAGNOSIS — M25551 Pain in right hip: Secondary | ICD-10-CM | POA: Diagnosis present

## 2014-12-26 DIAGNOSIS — M25559 Pain in unspecified hip: Secondary | ICD-10-CM

## 2014-12-26 DIAGNOSIS — I63329 Cerebral infarction due to thrombosis of unspecified anterior cerebral artery: Secondary | ICD-10-CM | POA: Diagnosis not present

## 2014-12-26 LAB — CBC
HCT: 44.3 % (ref 36.0–46.0)
HEMOGLOBIN: 15.1 g/dL — AB (ref 12.0–15.0)
MCH: 30.9 pg (ref 26.0–34.0)
MCHC: 34.1 g/dL (ref 30.0–36.0)
MCV: 90.6 fL (ref 78.0–100.0)
PLATELETS: 173 10*3/uL (ref 150–400)
RBC: 4.89 MIL/uL (ref 3.87–5.11)
RDW: 13.3 % (ref 11.5–15.5)
WBC: 14.7 10*3/uL — ABNORMAL HIGH (ref 4.0–10.5)

## 2014-12-26 LAB — COMPREHENSIVE METABOLIC PANEL
ALK PHOS: 60 U/L (ref 38–126)
ALT: 22 U/L (ref 14–54)
AST: 60 U/L — AB (ref 15–41)
Albumin: 4.1 g/dL (ref 3.5–5.0)
Anion gap: 13 (ref 5–15)
BILIRUBIN TOTAL: 0.9 mg/dL (ref 0.3–1.2)
BUN: 27 mg/dL — ABNORMAL HIGH (ref 6–20)
CALCIUM: 9.5 mg/dL (ref 8.9–10.3)
CHLORIDE: 98 mmol/L — AB (ref 101–111)
CO2: 26 mmol/L (ref 22–32)
Creatinine, Ser: 1.14 mg/dL — ABNORMAL HIGH (ref 0.44–1.00)
GFR, EST AFRICAN AMERICAN: 51 mL/min — AB (ref >60)
GFR, EST NON AFRICAN AMERICAN: 44 mL/min — AB (ref >60)
GLUCOSE: 136 mg/dL — AB (ref 65–99)
POTASSIUM: 3.6 mmol/L (ref 3.5–5.1)
Sodium: 137 mmol/L (ref 135–145)
Total Protein: 7.3 g/dL (ref 6.5–8.1)

## 2014-12-26 LAB — DIFFERENTIAL
BASOS ABS: 0 10*3/uL (ref 0.0–0.1)
BASOS PCT: 0 % (ref 0–1)
EOS PCT: 0 % (ref 0–5)
Eosinophils Absolute: 0 10*3/uL (ref 0.0–0.7)
Lymphocytes Relative: 14 % (ref 12–46)
Lymphs Abs: 2.1 10*3/uL (ref 0.7–4.0)
Monocytes Absolute: 1.6 10*3/uL — ABNORMAL HIGH (ref 0.1–1.0)
Monocytes Relative: 11 % (ref 3–12)
Neutro Abs: 10.9 10*3/uL — ABNORMAL HIGH (ref 1.7–7.7)
Neutrophils Relative %: 75 % (ref 43–77)

## 2014-12-26 LAB — I-STAT CHEM 8, ED
BUN: 29 mg/dL — ABNORMAL HIGH (ref 6–20)
CREATININE: 1.1 mg/dL — AB (ref 0.44–1.00)
Calcium, Ion: 1.17 mmol/L (ref 1.13–1.30)
Chloride: 100 mmol/L — ABNORMAL LOW (ref 101–111)
Glucose, Bld: 136 mg/dL — ABNORMAL HIGH (ref 65–99)
HCT: 47 % — ABNORMAL HIGH (ref 36.0–46.0)
HEMOGLOBIN: 16 g/dL — AB (ref 12.0–15.0)
POTASSIUM: 3.7 mmol/L (ref 3.5–5.1)
SODIUM: 137 mmol/L (ref 135–145)
TCO2: 23 mmol/L (ref 0–100)

## 2014-12-26 LAB — MRSA PCR SCREENING: MRSA BY PCR: NEGATIVE

## 2014-12-26 LAB — I-STAT TROPONIN, ED: Troponin i, poc: 0.01 ng/mL (ref 0.00–0.08)

## 2014-12-26 LAB — PROTIME-INR
INR: 1.02 (ref 0.00–1.49)
PROTHROMBIN TIME: 13.6 s (ref 11.6–15.2)

## 2014-12-26 LAB — ETHANOL: Alcohol, Ethyl (B): <5 mg/dL (ref <5)

## 2014-12-26 LAB — APTT: APTT: 21 s — AB (ref 24–37)

## 2014-12-26 MED ORDER — ASPIRIN 300 MG RE SUPP
RECTAL | Status: AC
  Filled 2014-12-26: qty 1

## 2014-12-26 MED ORDER — STROKE: EARLY STAGES OF RECOVERY BOOK
Freq: Once | Status: AC
Start: 2014-12-26 — End: 2014-12-27
  Administered 2014-12-27: 14:00:00
  Filled 2014-12-26: qty 1

## 2014-12-26 MED ORDER — HYDRALAZINE HCL 20 MG/ML IJ SOLN: 5.0000 mg | Freq: Four times a day (QID) | INTRAMUSCULAR | Status: DC | PRN

## 2014-12-26 MED ORDER — SILVER SULFADIAZINE 1 % EX CREA
TOPICAL_CREAM | Freq: Every day | CUTANEOUS | Status: AC
  Administered 2014-12-26: 18:00:00 via TOPICAL
  Filled 2014-12-26: qty 50

## 2014-12-26 MED ORDER — IOHEXOL 350 MG/ML SOLN
100.0000 mL | Freq: Once | INTRAVENOUS | Status: AC | PRN
  Administered 2014-12-26: 75 mL via INTRAVENOUS

## 2014-12-26 MED ORDER — SODIUM CHLORIDE 0.9 % IV SOLN
INTRAVENOUS | Status: DC
  Administered 2014-12-26 – 2014-12-28 (×3): via INTRAVENOUS

## 2014-12-26 MED ORDER — HEPARIN SODIUM (PORCINE) 5000 UNIT/ML IJ SOLN
5000.0000 [IU] | Freq: Three times a day (TID) | INTRAMUSCULAR | Status: DC
  Administered 2014-12-26 – 2014-12-29 (×8): 5000 [IU] via SUBCUTANEOUS
  Filled 2014-12-26 (×8): qty 1

## 2014-12-26 MED ORDER — ASPIRIN 300 MG RE SUPP
300.0000 mg | Freq: Every day | RECTAL | Status: DC
  Administered 2014-12-26 – 2014-12-28 (×3): 300 mg via RECTAL
  Filled 2014-12-26 (×4): qty 1

## 2014-12-26 MED ORDER — SODIUM CHLORIDE 0.9 % IV SOLN: INTRAVENOUS | Status: DC

## 2014-12-26 MED ORDER — ASPIRIN 325 MG PO TABS
325.0000 mg | ORAL_TABLET | Freq: Every day | ORAL | Status: DC
  Administered 2014-12-29: 325 mg via ORAL
  Filled 2014-12-26: qty 1

## 2014-12-26 NOTE — H&P (Signed)
[facility] History and Physical  [name] WJX:[id number] DOB: 1930/10/31 DOA: 12/26/2014  Referring physician: ER, Dr. Adriana Simas PCP: Donzetta Sprung, MD   Chief Complaint: Altered mental status  HPI: [name] is a 81 y.o. female  This is an 81 year old lady who apparently was last in her usual state yesterday evening. Today, at approximately 1 PM she was found in her home on the floor. She was disorientated and almost unresponsive. The patient has a history of hypertension, cardiomyopathy, coronary artery disease, status post implanted defibrillator. The patient herself cannot give me any clear history. Evaluation in the emergency room with a CT scan shows CVA, fairly sizable, affecting the left MCA distribution. She appears to have hemodynamically significant stenosis in the distal left MCA branches probably due to thromboembolic disease. She is now being admitted for further management.   Review of Systems:  Unable to give a review of systems secondary to her altered mental status.  Past Medical History  Diagnosis Date  . Essential hypertension, benign   . Automatic implantable cardiac defibrillator in situ     [employer]tr[employer]  . Hyperlipidemia   . Hypothyroidism   . Depression   . Osteopenia   . Coronary atherosclerosis of native coronary artery     Stent to OM3, DES D1 and RCA 3/12  . B12 deficiency   . Spinal stenosis     Chronic back pain  . Ischemic cardiomyopathy     LVEF 35-40%  . Myocardial infarction     May 2006, March 2012   Past Surgical History  Procedure Laterality Date  . Cardiac defibrillator placement      [employer]  . Joint replacement    . Replacement total knee Bilateral     Right-April 2006, Left-April 2011  . Tonsillectomy  1952  . Appendectomy  1956  . Abdominal hysterectomy  1971  . Cataract extraction w/phaco Left 10/04/2013    Procedure: CATARACT EXTRACTION PHACO AND INTRAOCULAR LENS PLACEMENT (IOC);  Surgeon: Gemma Payor, MD;   Location: [facility];  Service: Ophthalmology;  Laterality: Left;  CDE 9.37  . Cataract extraction w/phaco Right 10/25/2013    Procedure: CATARACT EXTRACTION PHACO AND INTRAOCULAR LENS PLACEMENT (IOC);  Surgeon: Gemma Payor, MD;  Location: [facility];  Service: Ophthalmology;  Laterality: Right;  CDE 8.57  . Implantable cardioverter defibrillator (icd) generator change N/A 04/22/2014    Procedure: ICD GENERATOR CHANGE;  Surgeon: Marinus Maw, MD;  Location: [facility] CATH LAB;  Service: Cardiovascular;  Laterality: N/A;   Social History:  reports that she quit smoking about 4 years ago. Her smoking use included Cigarettes. She has a 12.5 pack-year smoking history. She has never used smokeless tobacco. She reports that she does not drink alcohol or use illicit drugs.  Allergies  Allergen Reactions  . Nsaids Other (See Comments)    Vomited blood   . Oxycodone Hcl Hives and Rash  . Povidone-Iodine Rash  . Prednisone Nausea Only and Other (See Comments)    FLU SYMPTOMS  . Amoxicillin-Pot Clavulanate Other (See Comments)    VOMITING  . Doxazosin Mesylate Other (See Comments)    INCONTINENCE   . Betadine [Povidone Iodine]     rash  . Cephalexin Rash  . Cymbalta [Duloxetine Hcl] Nausea Only and Other (See Comments)    Headache  . Gabapentin Rash  . Neomycin Rash  . Tape Other (See Comments)    Redness    Family History  Problem Relation Age of Onset  . Stroke Mother   .  Stroke Father   . Heart attack Sister   . Heart attack Brother   . Stroke Sister     Prior to Admission medications   Medication Sig Start Date End Date Taking? Authorizing Provider  acetaminophen (TYLENOL) 650 MG CR tablet Take 650 mg by mouth every 8 (eight) hours as needed for pain.    Yes Historical Provider, MD  aspirin 81 MG tablet Take 81 mg by mouth daily.   Yes Historical Provider, MD  Calcium Carbonate-Vitamin D (CALTRATE 600+D) 600-400 MG-UNIT per tablet Take 1 tablet by mouth daily.    Yes Historical Provider, MD    chlorthalidone (HYGROTON) 25 MG tablet Take 25 mg by mouth daily.    Yes Historical Provider, MD  Cholecalciferol (VITAMIN D) 2000 UNITS CAPS Take 2,000 Units by mouth daily.   Yes Historical Provider, MD  cloNIDine (CATAPRES) 0.1 MG tablet Take 0.1 mg by mouth at bedtime.    Yes Historical Provider, MD  clopidogrel (PLAVIX) 75 MG tablet Take 75 mg by mouth at bedtime.    Yes Historical Provider, MD  eplerenone (INSPRA) 25 MG tablet Take 25 mg by mouth at bedtime.    Yes Historical Provider, MD  estradiol (ESTRACE) 1 MG tablet Take 1 mg by mouth at bedtime.     Yes Historical Provider, MD  ezetimibe (ZETIA) 10 MG tablet Take 10 mg by mouth daily.   Yes Historical Provider, MD  levothyroxine (SYNTHROID) 112 MCG tablet Take 112 mcg by mouth daily.     Yes Historical Provider, MD  loratadine (CLARITIN) 10 MG tablet Take 10 mg by mouth daily as needed for allergies.   Yes Historical Provider, MD  losartan (COZAAR) 100 MG tablet Take 100 mg by mouth daily.   Yes Historical Provider, MD  metoprolol succinate (TOPROL-XL) 50 MG 24 hr tablet Take 1 tablet (50 mg total) by mouth daily. Take with or immediately following a meal. 04/23/14  Yes Jodelle Gross, NP  sertraline (ZOLOFT) 100 MG tablet Take 1/2 tab (50mg ) at bedtime    Yes Historical Provider, MD  vitamin B-12 (CYANOCOBALAMIN) 1000 MCG tablet Take 1,000 mcg by mouth daily.    Yes Historical Provider, MD   Physical Exam: Filed Vitals:   12/26/14 1730 12/26/14 1745 12/26/14 1750 12/26/14 1800  BP: 141/86 148/73  130/78  Pulse: 77 87  86  Temp:   98.4 F (36.9 C) 98.2 F (36.8 C)  TempSrc:   Oral Oral  Resp: 15 25  19   Height:      Weight:      SpO2: 94% 97%  97%    Wt Readings from Last 3 Encounters:  12/26/14 70.761 kg (156 lb)  07/18/14 70.761 kg (156 lb)  05/23/14 70.761 kg (156 lb)    General:  Appears  confused. She is alert and opens her eyes. She clearly has likely both nominal and nonfluent dysphasia. Eyes: PERRL,  normal lids, irises & conjunctiva ENT: grossly normal hearing, lips & tongue Neck: no LAD, masses or thyromegaly Cardiovascular: RRR, no m/r/g. No LE edema. Telemetry: SR, no arrhythmias  Respiratory: CTA bilaterally, no w/r/r. Normal respiratory effort. Abdomen: soft, ntnd Skin: no rash or induration seen on limited exam Musculoskeletal: grossly normal tone BUE/BLE Psychiatric: grossly normal mood and affect, speech fluent and appropriate Neurologic: grossly non-focal.          Labs on Admission:  Basic Metabolic Panel:  Recent Labs Lab 12/26/14 1417 12/26/14 1424  NA 137 137  K 3.6 3.7  CL 98* 100*  CO2 26  --   GLUCOSE 136* 136*  BUN 27* 29*  CREATININE 1.14* 1.10*  CALCIUM 9.5  --    Liver Function Tests:  Recent Labs Lab 12/26/14 1417  AST 60*  ALT 22  ALKPHOS 60  BILITOT 0.9  PROT 7.3  ALBUMIN 4.1   No results for input(s): LIPASE, AMYLASE in the last 168 hours. No results for input(s): AMMONIA in the last 168 hours. CBC:  Recent Labs Lab 12/26/14 1417 12/26/14 1424  WBC 14.7*  --   NEUTROABS 10.9*  --   HGB 15.1* 16.0*  HCT 44.3 47.0*  MCV 90.6  --   PLT 173  --    Cardiac Enzymes: No results for input(s): CKTOTAL, CKMB, CKMBINDEX, TROPONINI in the last 168 hours.  BNP (last 3 results) No results for input(s): BNP in the last 8760 hours.  ProBNP (last 3 results) No results for input(s): PROBNP in the last 8760 hours.  CBG: No results for input(s): GLUCAP in the last 168 hours.  Radiological Exams on Admission: Ct Angio Head W/cm &/or Wo Cm  12/26/2014   CLINICAL DATA:  81 year old female altered mental status, found down. Code stroke with left insula changes on noncontrast head CT. Initial encounter.  EXAM: CT ANGIOGRAPHY HEAD AND NECK  TECHNIQUE: Multidetector CT imaging of the head and neck was performed using the standard protocol during bolus administration of intravenous contrast. Multiplanar CT image reconstructions and MIPs were  obtained to evaluate the vascular anatomy. Carotid stenosis measurements (when applicable) are obtained utilizing NASCET criteria, using the distal internal carotid diameter as the denominator.  CONTRAST:  75mL OMNIPAQUE IOHEXOL 350 MG/ML SOLN  COMPARISON:  Noncontrast head CT 1409 hours today.  FINDINGS: CTA NECK  Skeleton: Absent dentition. Widespread disc, endplate, and facet degeneration in the cervical spine. Left chest cardiac pacemaker. No acute osseous abnormality identified. Mild left maxillary sinus mucosal thickening.  Other neck: Negative lung apices. No superior mediastinal lymphadenopathy.  Diminutive thyroid. Larynx, pharynx, parapharyngeal spaces, retropharyngeal space, sublingual space, submandibular glands, and parotid glands are within normal limits. Postoperative changes to the globes. Visualized scalp soft tissues are within normal limits. No cervical lymphadenopathy.  Aortic arch: 3 vessel arch configuration. Moderate mostly calcified arch atherosclerosis. No great vessel origins stenosis.  Right carotid system: Tortuous brachiocephalic artery and proximal right CCA. Normal right CCA origin. Confluent calcified plaque at the right carotid bifurcation, but less than 50 % stenosis with respect to the distal vessel results. Negative cervical right ICA otherwise.  Left carotid system: No left CCA origin stenosis. Severely tortuous proximal left CCA with a kinked appearance (sagittal image 107 of series 607), but otherwise no proximal left carotid stenosis. Confluent calcified plaque at the left carotid bifurcation. Superimposed left ICA bulb soft plaque. Stenosis up to 65-70 percent occurs at the bulb (see sagittal image 121 of series 607). Beyond the bulb the cervical left ICA is tortuous but otherwise negative.  Vertebral arteries:Tortuous proximal right subclavian artery with a kinked appearance as well as some ulcerated appearing soft plaque (sagittal image 118 of series 607). Dominant left  vertebral artery with calcified plaque at its origin and mild to moderate stenosis. Tortuous left V1 segment. Widely patent left vertebral artery otherwise to the skullbase.  No proximal right subclavian artery stenosis despite calcified plaque. Non dominant and diminutive right vertebral artery with no stenosis at its origin. The right vertebral is highly diminutive and appears to terminate in muscular branches at the C2  level, but is been reconstituted at the C1 level and remains patent into the posterior fossa.  CTA HEAD  Posterior circulation: Dominant distal left vertebral artery is patent without stenosis and primarily supplies the basilar.  Multifocal high-grade segmental stenosis of the non dominant right V4 segment. High-grade stenosis just proximal to the PICA origin but the right PICA origin is patent and is probably supplied in a retrograde fashion from the vertebrobasilar junction.  Irregular basilar artery with atherosclerosis but no stenosis. Ectatic basilar tip. SCA and PCA origins are within normal limits. Posterior communicating arteries are diminutive or absent. Bilateral PCA branches are mildly irregular and patent.  Anterior circulation: Calcified plaque in both ICA siphons. This is maximal in the supraclinoid ICA segments. There is moderate to high-grade stenosis best seen on series 605, image 95. The left ICA terminus remains patent despite this finding. Moderate to severe stenosis also on the right (series 605, image 97). The right ICA terminus is patent.  Dominant left ACA A1 segment. Normal ACA origins. Normal anterior communicating artery. Bilateral ACA branches are within normal limits.  Right MCA origin and M1 segment is within normal limits. Right MCA bifurcation is patent. Right MCA branches are within normal limits.  Left MCA origin is patent. There is mild to moderate irregularity of the left MCA M1 segment but up to only mild stenosis (series 602, image 49). The left MCA bifurcation  is patent. The proximal M2 branches are patent. There is attenuated flow in both middle and posterior M2/M3 branches.  Venous sinuses: Patent.  Anatomic variants: Dominant left vertebral artery.  Delayed phase: Continued hypodensity in the insula and anterior left operculum, as well as asymmetric hypodensity in the subcortical white matter at the level of the corona radiata (ASPECTS score of 7). No abnormal enhancement identified. No intracranial mass effect at this time.  IMPRESSION: 1. No emergent large vessel occlusion. Hemodynamically significant stenosis in distal left M2/M3 MCA branches probably due to thromboembolic disease. 2. Upstream moderate to high-grade stenoses of the left supraclinoid ICA (calcified plaque), and left ICA bulb (65-70%, calcified and soft plaque). 3. Stable CT changes of acute cortically based left MCA infarct (ASPECTS score of 7). 4. Moderate to high-grade right supraclinoid ICA stenosis. No other hemodynamically significant right carotid stenosis. 5. Dominant and dolichoectasia attic left vertebral artery with mild to moderate origin stenosis. Non dominant right vertebral artery with tandem high-grade stenoses in the V4 segment. The right PICA is likely supplied in a retrograde fashion from the left. Basilar artery dolichoectasia. 6. Ulcerated plaque in the proximal left subclavian artery without stenosis.   Electronically Signed   By: Odessa Fleming M.D.   On: 12/26/2014 15:57   Ct Head Wo Contrast  12/26/2014   CLINICAL DATA:  Code stroke, altered mental status  EXAM: CT HEAD WITHOUT CONTRAST  TECHNIQUE: Contiguous axial images were obtained from the base of the skull through the vertex without intravenous contrast.  COMPARISON:  12/26/2009  FINDINGS: No evidence of parenchymal hemorrhage or extra-axial fluid collection. No mass lesion, mass effect, or midline shift.  Hypodensity in the left insular cortex (series 2/image 16), suspicious for acute left MCA distribution infarct. Less  than 1/3 of the left MCA territory is involved.  Subcortical white matter and periventricular small vessel ischemic changes. Intracranial atherosclerosis.  Mild cortical atrophy.  No ventriculomegaly.  The visualized paranasal sinuses are essentially clear. The mastoid air cells are unopacified.  No evidence of calvarial fracture.  IMPRESSION: Suspected acute left MCA  distribution infarct, as above.  Critical Value/emergent results were called by telephone at the time of interpretation on 12/26/2014 at 2:20 pm to Dr. Donnetta Hutching, who verbally acknowledged these results.   Electronically Signed   By: Charline Bills M.D.   On: 12/26/2014 14:23   Ct Angio Neck W/cm &/or Wo/cm  12/26/2014   CLINICAL DATA:  81 year old female altered mental status, found down. Code stroke with left insula changes on noncontrast head CT. Initial encounter.  EXAM: CT ANGIOGRAPHY HEAD AND NECK  TECHNIQUE: Multidetector CT imaging of the head and neck was performed using the standard protocol during bolus administration of intravenous contrast. Multiplanar CT image reconstructions and MIPs were obtained to evaluate the vascular anatomy. Carotid stenosis measurements (when applicable) are obtained utilizing NASCET criteria, using the distal internal carotid diameter as the denominator.  CONTRAST:  75mL OMNIPAQUE IOHEXOL 350 MG/ML SOLN  COMPARISON:  Noncontrast head CT 1409 hours today.  FINDINGS: CTA NECK  Skeleton: Absent dentition. Widespread disc, endplate, and facet degeneration in the cervical spine. Left chest cardiac pacemaker. No acute osseous abnormality identified. Mild left maxillary sinus mucosal thickening.  Other neck: Negative lung apices. No superior mediastinal lymphadenopathy.  Diminutive thyroid. Larynx, pharynx, parapharyngeal spaces, retropharyngeal space, sublingual space, submandibular glands, and parotid glands are within normal limits. Postoperative changes to the globes. Visualized scalp soft tissues are within  normal limits. No cervical lymphadenopathy.  Aortic arch: 3 vessel arch configuration. Moderate mostly calcified arch atherosclerosis. No great vessel origins stenosis.  Right carotid system: Tortuous brachiocephalic artery and proximal right CCA. Normal right CCA origin. Confluent calcified plaque at the right carotid bifurcation, but less than 50 % stenosis with respect to the distal vessel results. Negative cervical right ICA otherwise.  Left carotid system: No left CCA origin stenosis. Severely tortuous proximal left CCA with a kinked appearance (sagittal image 107 of series 607), but otherwise no proximal left carotid stenosis. Confluent calcified plaque at the left carotid bifurcation. Superimposed left ICA bulb soft plaque. Stenosis up to 65-70 percent occurs at the bulb (see sagittal image 121 of series 607). Beyond the bulb the cervical left ICA is tortuous but otherwise negative.  Vertebral arteries:Tortuous proximal right subclavian artery with a kinked appearance as well as some ulcerated appearing soft plaque (sagittal image 118 of series 607). Dominant left vertebral artery with calcified plaque at its origin and mild to moderate stenosis. Tortuous left V1 segment. Widely patent left vertebral artery otherwise to the skullbase.  No proximal right subclavian artery stenosis despite calcified plaque. Non dominant and diminutive right vertebral artery with no stenosis at its origin. The right vertebral is highly diminutive and appears to terminate in muscular branches at the C2 level, but is been reconstituted at the C1 level and remains patent into the posterior fossa.  CTA HEAD  Posterior circulation: Dominant distal left vertebral artery is patent without stenosis and primarily supplies the basilar.  Multifocal high-grade segmental stenosis of the non dominant right V4 segment. High-grade stenosis just proximal to the PICA origin but the right PICA origin is patent and is probably supplied in a  retrograde fashion from the vertebrobasilar junction.  Irregular basilar artery with atherosclerosis but no stenosis. Ectatic basilar tip. SCA and PCA origins are within normal limits. Posterior communicating arteries are diminutive or absent. Bilateral PCA branches are mildly irregular and patent.  Anterior circulation: Calcified plaque in both ICA siphons. This is maximal in the supraclinoid ICA segments. There is moderate to high-grade stenosis best seen on  series 605, image 95. The left ICA terminus remains patent despite this finding. Moderate to severe stenosis also on the right (series 605, image 97). The right ICA terminus is patent.  Dominant left ACA A1 segment. Normal ACA origins. Normal anterior communicating artery. Bilateral ACA branches are within normal limits.  Right MCA origin and M1 segment is within normal limits. Right MCA bifurcation is patent. Right MCA branches are within normal limits.  Left MCA origin is patent. There is mild to moderate irregularity of the left MCA M1 segment but up to only mild stenosis (series 602, image 49). The left MCA bifurcation is patent. The proximal M2 branches are patent. There is attenuated flow in both middle and posterior M2/M3 branches.  Venous sinuses: Patent.  Anatomic variants: Dominant left vertebral artery.  Delayed phase: Continued hypodensity in the insula and anterior left operculum, as well as asymmetric hypodensity in the subcortical white matter at the level of the corona radiata (ASPECTS score of 7). No abnormal enhancement identified. No intracranial mass effect at this time.  IMPRESSION: 1. No emergent large vessel occlusion. Hemodynamically significant stenosis in distal left M2/M3 MCA branches probably due to thromboembolic disease. 2. Upstream moderate to high-grade stenoses of the left supraclinoid ICA (calcified plaque), and left ICA bulb (65-70%, calcified and soft plaque). 3. Stable CT changes of acute cortically based left MCA infarct  (ASPECTS score of 7). 4. Moderate to high-grade right supraclinoid ICA stenosis. No other hemodynamically significant right carotid stenosis. 5. Dominant and dolichoectasia attic left vertebral artery with mild to moderate origin stenosis. Non dominant right vertebral artery with tandem high-grade stenoses in the V4 segment. The right PICA is likely supplied in a retrograde fashion from the left. Basilar artery dolichoectasia. 6. Ulcerated plaque in the proximal left subclavian artery without stenosis.   Electronically Signed   By: Odessa Fleming M.D.   On: 12/26/2014 15:57    EKG: Independently revieweSinus rhythm. No acute ST-T wave elevation. Assessment/Plan   1. Acute left MCA distribution CVA. The stroke appears to be fairly sizable and I have informed the family of this. She is outside the window of TPA. She'll be kept nothing by mouth and we will obtain swallowing evaluation. She will be given IV fluids. She will be given rectal aspirin. Neurology consultation. Stroke workup limited. 2. Hypertension. Stable. I will allow permissive hypertension in this situation. 3. Chronic systolic heart failure. Stable.  Further recommendations will depend on patient's hospital progress.  Code Status: DO NOT RESUSCITATE. This was confirmed by both children at bedside.    DVT Prophylaxis: heparin.   Family Communication:  discussed in detail the patient's daughter and son the patient's condition. We discussed issues such as PEG tube feeding, CODE STATUS, overall prognosis and progress that would be expected. They understand and appreciate and are in favor of not aggressive measures and will likely not want her to have artificial feeding but will continue to think about this.    Disposition Plan: depending on progress.  Time spent: 60 minutes.   Wilson Singer [facility] Pager [phone].

## 2014-12-26 NOTE — ED Provider Notes (Signed)
CSN: [id number]     Arrival date & time 12/26/14  1351 History   First MD Initiated Contact with Patient 12/26/14 1402     Chief Complaint  Patient presents with  . Altered Mental Status    An emergency department physician performed an initial assessment on this suspected stroke patient at 1402. (Consider location/radiation/quality/duration/timing/severity/associated sxs/prior Treatment) HPI..... Level V caveat for acuity of condition. Patient arrived by EMS confused and disoriented. She was last normal proximate 6 PM last night. She was found on the floor her kitchen by her brother approximately 1:30 PM today. He noted her to be disoriented. EMS was notified. Past medical history is extensive and includes defibrillator insertion, hypertension, hyperlipidemia, hypothyroidism, CAD, cardiomyopathy, MI  Past Medical History  Diagnosis Date  . Essential hypertension, benign   . Automatic implantable cardiac defibrillator in situ     Medtronic  . Hyperlipidemia   . Hypothyroidism   . Depression   . Osteopenia   . Coronary atherosclerosis of native coronary artery     Stent to OM3, DES D1 and RCA 3/12  . B12 deficiency   . Spinal stenosis     Chronic back pain  . Ischemic cardiomyopathy     LVEF 35-40%  . Myocardial infarction     May 2006, March 2012   Past Surgical History  Procedure Laterality Date  . Cardiac defibrillator placement      Medtronic  . Joint replacement    . Replacement total knee Bilateral     Right-April 2006, Left-April 2011  . Tonsillectomy  1952  . Appendectomy  1956  . Abdominal hysterectomy  1971  . Cataract extraction w/phaco Left 10/04/2013    Procedure: CATARACT EXTRACTION PHACO AND INTRAOCULAR LENS PLACEMENT (IOC);  Surgeon: Gemma Payor, MD;  Location: [facility];  Service: Ophthalmology;  Laterality: Left;  CDE 9.37  . Cataract extraction w/phaco Right 10/25/2013    Procedure: CATARACT EXTRACTION PHACO AND INTRAOCULAR LENS PLACEMENT (IOC);  Surgeon: Gemma Payor, MD;  Location: [facility];  Service: Ophthalmology;  Laterality: Right;  CDE 8.57  . Implantable cardioverter defibrillator (icd) generator change N/A 04/22/2014    Procedure: ICD GENERATOR CHANGE;  Surgeon: Marinus Maw, MD;  Location: [facility] CATH [facility];  Service: Cardiovascular;  Laterality: N/A;   Family History  Problem Relation Age of Onset  . Stroke Mother   . Stroke Father   . Heart attack Sister   . Heart attack Brother   . Stroke Sister    History  Substance Use Topics  . Smoking status: Former Smoker -- 0.50 packs/day for 25 years    Types: Cigarettes    Quit date: 09/13/2010  . Smokeless tobacco: Never Used  . Alcohol Use: No   OB History    No data available     Review of Systems  Unable to perform ROS: Acuity of condition      Allergies  Nsaids; Oxycodone hcl; Povidone-iodine; Prednisone; Amoxicillin-pot clavulanate; Doxazosin mesylate; Betadine; Cephalexin; Cymbalta; Gabapentin; Neomycin; and Tape  Home Medications   Prior to Admission medications   Medication Sig Start Date End Date Taking? Authorizing Provider  acetaminophen (TYLENOL) 650 MG CR tablet Take 650 mg by mouth every 8 (eight) hours as needed for pain.    Yes Historical Provider, MD  aspirin 81 MG tablet Take 81 mg by mouth daily.   Yes Historical Provider, MD  Calcium Carbonate-Vitamin D (CALTRATE 600+D) 600-400 MG-UNIT per tablet Take 1 tablet by mouth daily.    Yes Historical  Provider, MD  chlorthalidone (HYGROTON) 25 MG tablet Take 25 mg by mouth daily.    Yes Historical Provider, MD  Cholecalciferol (VITAMIN D) 2000 UNITS CAPS Take 2,000 Units by mouth daily.   Yes Historical Provider, MD  cloNIDine (CATAPRES) 0.1 MG tablet Take 0.1 mg by mouth at bedtime.    Yes Historical Provider, MD  clopidogrel (PLAVIX) 75 MG tablet Take 75 mg by mouth at bedtime.    Yes Historical Provider, MD  eplerenone (INSPRA) 25 MG tablet Take 25 mg by mouth at bedtime.    Yes Historical Provider, MD  estradiol  (ESTRACE) 1 MG tablet Take 1 mg by mouth at bedtime.     Yes Historical Provider, MD  ezetimibe (ZETIA) 10 MG tablet Take 10 mg by mouth daily.   Yes Historical Provider, MD  levothyroxine (SYNTHROID) 112 MCG tablet Take 112 mcg by mouth daily.     Yes Historical Provider, MD  loratadine (CLARITIN) 10 MG tablet Take 10 mg by mouth daily as needed for allergies.   Yes Historical Provider, MD  losartan (COZAAR) 100 MG tablet Take 100 mg by mouth daily.   Yes Historical Provider, MD  metoprolol succinate (TOPROL-XL) 50 MG 24 hr tablet Take 1 tablet (50 mg total) by mouth daily. Take with or immediately following a meal. 04/23/14  Yes Jodelle Gross, NP  sertraline (ZOLOFT) 100 MG tablet Take 1/2 tab (50mg ) at bedtime    Yes Historical Provider, MD  vitamin B-12 (CYANOCOBALAMIN) 1000 MCG tablet Take 1,000 mcg by mouth daily.    Yes Historical Provider, MD   BP 126/85 mmHg  Pulse 79  Temp(Src) 98.2 F (36.8 C) (Oral)  Resp 25  Ht 5\' 5"  (1.651 m)  Wt 156 lb (70.761 kg)  BMI 25.96 kg/m2  SpO2 95% Physical Exam  Constitutional:  Patient appears well, but is mumbling incoherently. She is not process any direct commands.  HENT:  Head: Normocephalic and atraumatic.  Eyes: Conjunctivae are normal. Pupils are equal, round, and reactive to light.  Neck: Normal range of motion. Neck supple.  Cardiovascular: Normal rate and regular rhythm.   Pulmonary/Chest: Effort normal and breath sounds normal.  Abdominal: Soft. Bowel sounds are normal.  Musculoskeletal: Normal range of motion.  Neurological:  She is able to move all 4 extremities but not to command  Skin: Skin is warm and dry.  Psychiatric:  unable  Nursing note and vitals reviewed.   ED Course  Procedures (including critical care time) Labs Review Labs Reviewed  APTT - Abnormal; Notable for the following:    aPTT 21 (*)    All other components within normal limits  CBC - Abnormal; Notable for the following:    WBC 14.7 (*)     Hemoglobin 15.1 (*)    All other components within normal limits  DIFFERENTIAL - Abnormal; Notable for the following:    Neutro Abs 10.9 (*)    Monocytes Absolute 1.6 (*)    All other components within normal limits  COMPREHENSIVE METABOLIC PANEL - Abnormal; Notable for the following:    Chloride 98 (*)    Glucose, Bld 136 (*)    BUN 27 (*)    Creatinine, Ser 1.14 (*)    AST 60 (*)    GFR calc non Af Amer 44 (*)    GFR calc Af Amer 51 (*)    All other components within normal limits  I-STAT CHEM 8, ED - Abnormal; Notable for the following:    Chloride 100 (*)  BUN 29 (*)    Creatinine, Ser 1.10 (*)    Glucose, Bld 136 (*)    Hemoglobin 16.0 (*)    HCT 47.0 (*)    All other components within normal limits  ETHANOL  PROTIME-INR  URINE RAPID DRUG SCREEN, HOSP PERFORMED  URINALYSIS, ROUTINE W REFLEX MICROSCOPIC (NOT AT [facility])  I-STAT TROPOININ, ED    Imaging Review Ct Angio Head W/cm &/or Wo Cm  12/26/2014   CLINICAL DATA:  81 year old female altered mental status, found down. Code stroke with left insula changes on noncontrast head CT. Initial encounter.  EXAM: CT ANGIOGRAPHY HEAD AND NECK  TECHNIQUE: Multidetector CT imaging of the head and neck was performed using the standard protocol during bolus administration of intravenous contrast. Multiplanar CT image reconstructions and MIPs were obtained to evaluate the vascular anatomy. Carotid stenosis measurements (when applicable) are obtained utilizing NASCET criteria, using the distal internal carotid diameter as the denominator.  CONTRAST:  75mL OMNIPAQUE IOHEXOL 350 MG/ML SOLN  COMPARISON:  Noncontrast head CT 1409 hours today.  FINDINGS: CTA NECK  Skeleton: Absent dentition. Widespread disc, endplate, and facet degeneration in the cervical spine. Left chest cardiac pacemaker. No acute osseous abnormality identified. Mild left maxillary sinus mucosal thickening.  Other neck: Negative lung apices. No superior mediastinal  lymphadenopathy.  Diminutive thyroid. Larynx, pharynx, parapharyngeal spaces, retropharyngeal space, sublingual space, submandibular glands, and parotid glands are within normal limits. Postoperative changes to the globes. Visualized scalp soft tissues are within normal limits. No cervical lymphadenopathy.  Aortic arch: 3 vessel arch configuration. Moderate mostly calcified arch atherosclerosis. No great vessel origins stenosis.  Right carotid system: Tortuous brachiocephalic artery and proximal right CCA. Normal right CCA origin. Confluent calcified plaque at the right carotid bifurcation, but less than 50 % stenosis with respect to the distal vessel results. Negative cervical right ICA otherwise.  Left carotid system: No left CCA origin stenosis. Severely tortuous proximal left CCA with a kinked appearance (sagittal image 107 of series 607), but otherwise no proximal left carotid stenosis. Confluent calcified plaque at the left carotid bifurcation. Superimposed left ICA bulb soft plaque. Stenosis up to 65-70 percent occurs at the bulb (see sagittal image 121 of series 607). Beyond the bulb the cervical left ICA is tortuous but otherwise negative.  Vertebral arteries:Tortuous proximal right subclavian artery with a kinked appearance as well as some ulcerated appearing soft plaque (sagittal image 118 of series 607). Dominant left vertebral artery with calcified plaque at its origin and mild to moderate stenosis. Tortuous left V1 segment. Widely patent left vertebral artery otherwise to the skullbase.  No proximal right subclavian artery stenosis despite calcified plaque. Non dominant and diminutive right vertebral artery with no stenosis at its origin. The right vertebral is highly diminutive and appears to terminate in muscular branches at the C2 level, but is been reconstituted at the C1 level and remains patent into the posterior fossa.  CTA HEAD  Posterior circulation: Dominant distal left vertebral artery is  patent without stenosis and primarily supplies the basilar.  Multifocal high-grade segmental stenosis of the non dominant right V4 segment. High-grade stenosis just proximal to the PICA origin but the right PICA origin is patent and is probably supplied in a retrograde fashion from the vertebrobasilar junction.  Irregular basilar artery with atherosclerosis but no stenosis. Ectatic basilar tip. SCA and PCA origins are within normal limits. Posterior communicating arteries are diminutive or absent. Bilateral PCA branches are mildly irregular and patent.  Anterior circulation: Calcified plaque in both  ICA siphons. This is maximal in the supraclinoid ICA segments. There is moderate to high-grade stenosis best seen on series 605, image 95. The left ICA terminus remains patent despite this finding. Moderate to severe stenosis also on the right (series 605, image 97). The right ICA terminus is patent.  Dominant left ACA A1 segment. Normal ACA origins. Normal anterior communicating artery. Bilateral ACA branches are within normal limits.  Right MCA origin and M1 segment is within normal limits. Right MCA bifurcation is patent. Right MCA branches are within normal limits.  Left MCA origin is patent. There is mild to moderate irregularity of the left MCA M1 segment but up to only mild stenosis (series 602, image 49). The left MCA bifurcation is patent. The proximal M2 branches are patent. There is attenuated flow in both middle and posterior M2/M3 branches.  Venous sinuses: Patent.  Anatomic variants: Dominant left vertebral artery.  Delayed phase: Continued hypodensity in the insula and anterior left operculum, as well as asymmetric hypodensity in the subcortical white matter at the level of the corona radiata (ASPECTS score of 7). No abnormal enhancement identified. No intracranial mass effect at this time.  IMPRESSION: 1. No emergent large vessel occlusion. Hemodynamically significant stenosis in distal left M2/M3 MCA  branches probably due to thromboembolic disease. 2. Upstream moderate to high-grade stenoses of the left supraclinoid ICA (calcified plaque), and left ICA bulb (65-70%, calcified and soft plaque). 3. Stable CT changes of acute cortically based left MCA infarct (ASPECTS score of 7). 4. Moderate to high-grade right supraclinoid ICA stenosis. No other hemodynamically significant right carotid stenosis. 5. Dominant and dolichoectasia attic left vertebral artery with mild to moderate origin stenosis. Non dominant right vertebral artery with tandem high-grade stenoses in the V4 segment. The right PICA is likely supplied in a retrograde fashion from the left. Basilar artery dolichoectasia. 6. Ulcerated plaque in the proximal left subclavian artery without stenosis.   Electronically Signed   By: Odessa Fleming M.D.   On: 12/26/2014 15:57   Ct Head Wo Contrast  12/26/2014   CLINICAL DATA:  Code stroke, altered mental status  EXAM: CT HEAD WITHOUT CONTRAST  TECHNIQUE: Contiguous axial images were obtained from the base of the skull through the vertex without intravenous contrast.  COMPARISON:  12/26/2009  FINDINGS: No evidence of parenchymal hemorrhage or extra-axial fluid collection. No mass lesion, mass effect, or midline shift.  Hypodensity in the left insular cortex (series 2/image 16), suspicious for acute left MCA distribution infarct. Less than 1/3 of the left MCA territory is involved.  Subcortical white matter and periventricular small vessel ischemic changes. Intracranial atherosclerosis.  Mild cortical atrophy.  No ventriculomegaly.  The visualized paranasal sinuses are essentially clear. The mastoid air cells are unopacified.  No evidence of calvarial fracture.  IMPRESSION: Suspected acute left MCA distribution infarct, as above.  Critical Value/emergent results were called by telephone at the time of interpretation on 12/26/2014 at 2:20 pm to Dr. Donnetta Hutching, who verbally acknowledged these results.   Electronically  Signed   By: Charline Bills M.D.   On: 12/26/2014 14:23   Ct Angio Neck W/cm &/or Wo/cm  12/26/2014   CLINICAL DATA:  81 year old female altered mental status, found down. Code stroke with left insula changes on noncontrast head CT. Initial encounter.  EXAM: CT ANGIOGRAPHY HEAD AND NECK  TECHNIQUE: Multidetector CT imaging of the head and neck was performed using the standard protocol during bolus administration of intravenous contrast. Multiplanar CT image reconstructions and MIPs were  obtained to evaluate the vascular anatomy. Carotid stenosis measurements (when applicable) are obtained utilizing NASCET criteria, using the distal internal carotid diameter as the denominator.  CONTRAST:  75mL OMNIPAQUE IOHEXOL 350 MG/ML SOLN  COMPARISON:  Noncontrast head CT 1409 hours today.  FINDINGS: CTA NECK  Skeleton: Absent dentition. Widespread disc, endplate, and facet degeneration in the cervical spine. Left chest cardiac pacemaker. No acute osseous abnormality identified. Mild left maxillary sinus mucosal thickening.  Other neck: Negative lung apices. No superior mediastinal lymphadenopathy.  Diminutive thyroid. Larynx, pharynx, parapharyngeal spaces, retropharyngeal space, sublingual space, submandibular glands, and parotid glands are within normal limits. Postoperative changes to the globes. Visualized scalp soft tissues are within normal limits. No cervical lymphadenopathy.  Aortic arch: 3 vessel arch configuration. Moderate mostly calcified arch atherosclerosis. No great vessel origins stenosis.  Right carotid system: Tortuous brachiocephalic artery and proximal right CCA. Normal right CCA origin. Confluent calcified plaque at the right carotid bifurcation, but less than 50 % stenosis with respect to the distal vessel results. Negative cervical right ICA otherwise.  Left carotid system: No left CCA origin stenosis. Severely tortuous proximal left CCA with a kinked appearance (sagittal image 107 of series 607),  but otherwise no proximal left carotid stenosis. Confluent calcified plaque at the left carotid bifurcation. Superimposed left ICA bulb soft plaque. Stenosis up to 65-70 percent occurs at the bulb (see sagittal image 121 of series 607). Beyond the bulb the cervical left ICA is tortuous but otherwise negative.  Vertebral arteries:Tortuous proximal right subclavian artery with a kinked appearance as well as some ulcerated appearing soft plaque (sagittal image 118 of series 607). Dominant left vertebral artery with calcified plaque at its origin and mild to moderate stenosis. Tortuous left V1 segment. Widely patent left vertebral artery otherwise to the skullbase.  No proximal right subclavian artery stenosis despite calcified plaque. Non dominant and diminutive right vertebral artery with no stenosis at its origin. The right vertebral is highly diminutive and appears to terminate in muscular branches at the C2 level, but is been reconstituted at the C1 level and remains patent into the posterior fossa.  CTA HEAD  Posterior circulation: Dominant distal left vertebral artery is patent without stenosis and primarily supplies the basilar.  Multifocal high-grade segmental stenosis of the non dominant right V4 segment. High-grade stenosis just proximal to the PICA origin but the right PICA origin is patent and is probably supplied in a retrograde fashion from the vertebrobasilar junction.  Irregular basilar artery with atherosclerosis but no stenosis. Ectatic basilar tip. SCA and PCA origins are within normal limits. Posterior communicating arteries are diminutive or absent. Bilateral PCA branches are mildly irregular and patent.  Anterior circulation: Calcified plaque in both ICA siphons. This is maximal in the supraclinoid ICA segments. There is moderate to high-grade stenosis best seen on series 605, image 95. The left ICA terminus remains patent despite this finding. Moderate to severe stenosis also on the right (series  605, image 97). The right ICA terminus is patent.  Dominant left ACA A1 segment. Normal ACA origins. Normal anterior communicating artery. Bilateral ACA branches are within normal limits.  Right MCA origin and M1 segment is within normal limits. Right MCA bifurcation is patent. Right MCA branches are within normal limits.  Left MCA origin is patent. There is mild to moderate irregularity of the left MCA M1 segment but up to only mild stenosis (series 602, image 49). The left MCA bifurcation is patent. The proximal M2 branches are patent. There is attenuated  flow in both middle and posterior M2/M3 branches.  Venous sinuses: Patent.  Anatomic variants: Dominant left vertebral artery.  Delayed phase: Continued hypodensity in the insula and anterior left operculum, as well as asymmetric hypodensity in the subcortical white matter at the level of the corona radiata (ASPECTS score of 7). No abnormal enhancement identified. No intracranial mass effect at this time.  IMPRESSION: 1. No emergent large vessel occlusion. Hemodynamically significant stenosis in distal left M2/M3 MCA branches probably due to thromboembolic disease. 2. Upstream moderate to high-grade stenoses of the left supraclinoid ICA (calcified plaque), and left ICA bulb (65-70%, calcified and soft plaque). 3. Stable CT changes of acute cortically based left MCA infarct (ASPECTS score of 7). 4. Moderate to high-grade right supraclinoid ICA stenosis. No other hemodynamically significant right carotid stenosis. 5. Dominant and dolichoectasia attic left vertebral artery with mild to moderate origin stenosis. Non dominant right vertebral artery with tandem high-grade stenoses in the V4 segment. The right PICA is likely supplied in a retrograde fashion from the left. Basilar artery dolichoectasia. 6. Ulcerated plaque in the proximal left subclavian artery without stenosis.   Electronically Signed   By: Odessa Fleming M.D.   On: 12/26/2014 15:57     EKG  Interpretation   Date/Time:  Monday December 26 2014 14:02:22 EDT Ventricular Rate:  80 PR Interval:  204 QRS Duration: 85 QT Interval:  362 QTC Calculation: 418 R Axis:   46 Text Interpretation:  Sinus rhythm Atrial premature complex Consider left  atrial enlargement Inferior infarct, acute Extensive anterior infarct,  acute (LAD) Confirmed by Zedekiah Hinderman  MD, Sender Rueb ([id number]) on 12/26/2014 2:46:01 PM     CRITICAL CARE Performed by: Donnetta Hutching Total critical care time: 50 Critical care time was exclusive of separately billable procedures and treating other patients. Critical care was necessary to treat or prevent imminent or life-threatening deterioration. Critical care was time spent personally by me on the following activities: development of treatment plan with patient and/or surrogate as well as nursing, discussions with consultants, evaluation of patient's response to treatment, examination of patient, obtaining history from patient or surrogate, ordering and performing treatments and interventions, ordering and review of laboratory studies, ordering and review of radiographic studies, pulse oximetry and re-evaluation of patient's condition. MDM   Final diagnoses:  Cerebral infarction due to unspecified mechanism    Code stroke was immediately identified. I discussed her care with the tele neurologist and the neuro hospitalist at [facility]. Both specialists suggested no acute interventions were indicated. I discussed CODE STATUS with the children and other family members. Family members do not want intubation or electric shock for resuscitation purposes.    Donnetta Hutching, MD 12/26/14 [phone]
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
## 2014-12-26 NOTE — ED Notes (Signed)
Husband found pt laying in kitchen floor prior to arrival.  Last known well time 30-40 min ago.  Pt confused.  Moving all extremities.  cbg 131.  VSS.

## 2014-12-26 NOTE — ED Notes (Signed)
Room assignment changed to step down, awaiting bed.

## 2014-12-26 NOTE — ED Notes (Signed)
Spoke with Dr Adriana Simas regarding neuro check

## 2014-12-26 NOTE — ED Notes (Signed)
Dressing applied to right index with silvadene cream and dry dressing

## 2014-12-26 NOTE — Progress Notes (Signed)
1349 call from Penne Lash in the ER possible code stroke coming be here in 5 minutes  1352 "call again be here in a few"  1355 beeper when off   1411 called [facility] to notify code stroke

## 2014-12-27 ENCOUNTER — Inpatient Hospital Stay (HOSPITAL_COMMUNITY): Payer: Medicare Other

## 2014-12-27 DIAGNOSIS — I63329 Cerebral infarction due to thrombosis of unspecified anterior cerebral artery: Secondary | ICD-10-CM

## 2014-12-27 DIAGNOSIS — I639 Cerebral infarction, unspecified: Secondary | ICD-10-CM

## 2014-12-27 DIAGNOSIS — I10 Essential (primary) hypertension: Secondary | ICD-10-CM

## 2014-12-27 LAB — LIPID PANEL
CHOL/HDL RATIO: 4.5 ratio
CHOLESTEROL: 195 mg/dL (ref 0–200)
HDL: 43 mg/dL (ref >40)
LDL Cholesterol: 118 mg/dL — ABNORMAL HIGH (ref 0–99)
Triglycerides: 170 mg/dL — ABNORMAL HIGH (ref <150)
VLDL: 34 mg/dL (ref 0–40)

## 2014-12-27 LAB — ANTITHROMBIN III: AntiThromb III Func: 103 % (ref 75–120)

## 2014-12-27 LAB — RAPID URINE DRUG SCREEN, HOSP PERFORMED
Amphetamines: NOT DETECTED
BARBITURATES: NOT DETECTED
Benzodiazepines: NOT DETECTED
Cocaine: NOT DETECTED
Opiates: NOT DETECTED
Tetrahydrocannabinol: NOT DETECTED

## 2014-12-27 LAB — URINALYSIS, ROUTINE W REFLEX MICROSCOPIC
Bilirubin Urine: NEGATIVE
Glucose, UA: NEGATIVE mg/dL
Ketones, ur: NEGATIVE mg/dL
LEUKOCYTES UA: NEGATIVE
Nitrite: POSITIVE — AB
PH: 5.5 (ref 5.0–8.0)
Protein, ur: 30 mg/dL — AB
Specific Gravity, Urine: >1.03 — ABNORMAL HIGH (ref 1.005–1.030)
Urobilinogen, UA: 0.2 mg/dL (ref 0.0–1.0)

## 2014-12-27 LAB — URINE MICROSCOPIC-ADD ON

## 2014-12-27 LAB — TSH: TSH: 0.3 u[IU]/mL — ABNORMAL LOW (ref 0.350–4.500)

## 2014-12-27 MED ORDER — FENTANYL CITRATE (PF) 100 MCG/2ML IJ SOLN
25.0000 ug | INTRAMUSCULAR | Status: DC | PRN
  Administered 2014-12-27 – 2014-12-28 (×3): 25 ug via INTRAVENOUS
  Filled 2014-12-27 (×3): qty 2

## 2014-12-27 MED ORDER — ACETAMINOPHEN 325 MG RE SUPP: 325.0000 mg | RECTAL | Status: DC | PRN

## 2014-12-27 NOTE — Clinical Social Work Placement (Signed)
   CLINICAL SOCIAL WORK PLACEMENT  NOTE  Date:  12/27/2014  Patient Details  Name: [name] MRN: [id number] Date of Birth: June 15, 1931  Clinical Social Work is seeking post-discharge placement for this patient at the Skilled  Nursing Facility level of care (*CSW will initial, date and re-position this form in  chart as items are completed):  Yes   Patient/family provided with [facility] Clinical Social Work Department's list of facilities offering this level of care within the geographic area requested by the patient (or if unable, by the patient's family).  Yes   Patient/family informed of their freedom to choose among providers that offer the needed level of care, that participate in Medicare, Medicaid or managed care program needed by the patient, have an available bed and are willing to accept the patient.  Yes   Patient/family informed of [facility]'s ownership interest in [facility] and [facility], as well as of the fact that they are under no obligation to receive care at these facilities.  PASRR submitted to EDS on 12/27/14     PASRR number received on 12/27/14     Existing PASRR number confirmed on       FL2 transmitted to all facilities in geographic area requested by pt/family on       FL2 transmitted to all facilities within larger geographic area on       Patient informed that his/her managed care company has contracts with or will negotiate with certain facilities, including the following:            Patient/family informed of bed offers received.  Patient chooses bed at       Physician recommends and patient chooses bed at      Patient to be transferred to   on  .  Patient to be transferred to facility by       Patient family notified on   of transfer.  Name of family member notified:        PHYSICIAN       Additional Comment:    _______________________________________________ Karn Cassis, LCSW 12/27/2014, 3:20  PM [phone]

## 2014-12-27 NOTE — Consult Note (Signed)
Marie A. Merlene Laughter, MD     [email or web address]          [name] is an 81 y.o. female.   ASSESSMENT/PLAN: 1. Acute left MCA infarct due to intracranial occlusive disease. Risk factors hypertension, diabetes, dyslipidemia, age and coronary artery disease. 2. Multiple intracranial occlusive disease.  Recommendation: Increase aspirin to a full strength 325 mg. This is to be combined with Plavix for at least 3 months. Subsequently, she can be placed on a single antiplatelet agent preferably aspirin 325. However, she is on dual antiplatelet agents because of coronary disease and stents and suspect that she'll likely need to be on this combination for more extensive time period. Speech and occupational therapy along with physical therapy. Statin medication. Continuous factor modification including blood pressure and diabetes control.  The patient is a 81 year old white female who presents with unresponsiveness and confusion. She presented about 12 hours after she was last seen normal and therefore was out of the window for any type of thrombolytic therapies. She has been on a combination of enteric-coated aspirin 81 mg and Plavix because of stenting and coronary artery disease. The daughter reports that she was essentially mute but has improved overnight. It is unclear if she had any focal weakness. The history is very limited because of the severe aphasia on examination. No complaints are reported however stretches shows of breath, chest pain, headaches or dizziness.  GENERAL: This is a pleasant female in no acute distress.  HEENT: Supple. Atraumatic normocephalic.   ABDOMEN: soft  EXTREMITIES: No edema   BACK: Normal.  SKIN: Normal by inspection.    MENTAL STATUS: Patient is awake and alert. She has a rather severe aphasia both receptive and expressive about equally impaired. She may follow midline commands but this is not consistent. She does not follow  appendicular commands. She infrequently states one or 2 word sentences.  CRANIAL NERVES: Pupils are equal, round and reactive to light; extra ocular movements are full, there is no significant nystagmus; visual fields are very limited but she actually seems to have impaired peripheral vision bilaterally; upper and lower facial muscles are normal in strength and symmetric, there is no flattening of the nasolabial folds; tongue is midline; uvula is midline.  MOTOR: Unreliable but she appears to have at least 4+/5 in in the lower extremities and a 4 in the upper extremities.  COORDINATION: No dysmetria observed. No tremors. No parkinsonism.  REFLEXES: Deep tendon reflexes are symmetrical and normal. Babinski reflexes are flexor bilaterally.   SENSATION: Responds to painful some mild bilaterally.     Blood pressure 119/78, pulse 89, temperature 98.6 F (37 C), temperature source Oral, resp. rate 17, height '5\' 3"'  (1.6 m), weight 72.3 kg (159 lb 6.3 oz), SpO2 98 %.  Past Medical History  Diagnosis Date  . Essential hypertension, benign   . Automatic implantable cardiac defibrillator in situ     Medtronic  . Hyperlipidemia   . Hypothyroidism   . Depression   . Osteopenia   . Coronary atherosclerosis of native coronary artery     Stent to OM3, DES D1 and RCA 3/12  . B12 deficiency   . Spinal stenosis     Chronic back pain  . Ischemic cardiomyopathy     LVEF 35-40%  . Myocardial infarction     May 2006, March 2012    Past Surgical History  Procedure Laterality Date  . Cardiac defibrillator placement      Medtronic  . Joint  replacement    . Replacement total knee Bilateral     Right-April 2006, Left-April 2011  . Tonsillectomy  1952  . Appendectomy  1956  . Abdominal hysterectomy  1971  . Cataract extraction w/phaco Left 10/04/2013    Procedure: CATARACT EXTRACTION PHACO AND INTRAOCULAR LENS PLACEMENT (IOC);  Surgeon: Tonny Branch, MD;  Location: [facility];  Service: Ophthalmology;   Laterality: Left;  CDE 9.37  . Cataract extraction w/phaco Right 10/25/2013    Procedure: CATARACT EXTRACTION PHACO AND INTRAOCULAR LENS PLACEMENT (IOC);  Surgeon: Tonny Branch, MD;  Location: [facility];  Service: Ophthalmology;  Laterality: Right;  CDE 8.57  . Implantable cardioverter defibrillator (icd) generator change N/A 04/22/2014    Procedure: ICD GENERATOR CHANGE;  Surgeon: Evans Lance, MD;  Location: [facility] CATH LAB;  Service: Cardiovascular;  Laterality: N/A;    Family History  Problem Relation Age of Onset  . Stroke Mother   . Stroke Father   . Heart attack Sister   . Heart attack Brother   . Stroke Sister     Social History:  reports that she quit smoking about 4 years ago. Her smoking use included Cigarettes. She has a 12.5 pack-year smoking history. She has never used smokeless tobacco. She reports that she does not drink alcohol or use illicit drugs.  Allergies:  Allergies  Allergen Reactions  . Nsaids Other (See Comments)    Vomited blood   . Oxycodone Hcl Hives and Rash  . Povidone-Iodine Rash  . Prednisone Nausea Only and Other (See Comments)    FLU SYMPTOMS  . Amoxicillin-Pot Clavulanate Other (See Comments)    VOMITING  . Doxazosin Mesylate Other (See Comments)    INCONTINENCE   . Betadine [Povidone Iodine]     rash  . Cephalexin Rash  . Cymbalta [Duloxetine Hcl] Nausea Only and Other (See Comments)    Headache  . Gabapentin Rash  . Neomycin Rash  . Tape Other (See Comments)    Redness    Medications: Prior to Admission medications   Medication Sig Start Date End Date Taking? Authorizing Provider  acetaminophen (TYLENOL) 650 MG CR tablet Take 650 mg by mouth every 8 (eight) hours as needed for pain.    Yes Historical Provider, MD  aspirin 81 MG tablet Take 81 mg by mouth daily.   Yes Historical Provider, MD  Calcium Carbonate-Vitamin D (CALTRATE 600+D) 600-400 MG-UNIT per tablet Take 1 tablet by mouth daily.    Yes Historical Provider, MD  chlorthalidone  (HYGROTON) 25 MG tablet Take 25 mg by mouth daily.    Yes Historical Provider, MD  Cholecalciferol (VITAMIN D) 2000 UNITS CAPS Take 2,000 Units by mouth daily.   Yes Historical Provider, MD  cloNIDine (CATAPRES) 0.1 MG tablet Take 0.1 mg by mouth at bedtime.    Yes Historical Provider, MD  clopidogrel (PLAVIX) 75 MG tablet Take 75 mg by mouth at bedtime.    Yes Historical Provider, MD  eplerenone (INSPRA) 25 MG tablet Take 25 mg by mouth at bedtime.    Yes Historical Provider, MD  estradiol (ESTRACE) 1 MG tablet Take 1 mg by mouth at bedtime.     Yes Historical Provider, MD  ezetimibe (ZETIA) 10 MG tablet Take 10 mg by mouth daily.   Yes Historical Provider, MD  levothyroxine (SYNTHROID) 112 MCG tablet Take 112 mcg by mouth daily.     Yes Historical Provider, MD  loratadine (CLARITIN) 10 MG tablet Take 10 mg by mouth daily as needed for allergies.  Yes Historical Provider, MD  losartan (COZAAR) 100 MG tablet Take 100 mg by mouth daily.   Yes Historical Provider, MD  metoprolol succinate (TOPROL-XL) 50 MG 24 hr tablet Take 1 tablet (50 mg total) by mouth daily. Take with or immediately following a meal. 04/23/14  Yes Lendon Colonel, NP  sertraline (ZOLOFT) 100 MG tablet Take 1/2 tab (48m) at bedtime    Yes Historical Provider, MD  vitamin B-12 (CYANOCOBALAMIN) 1000 MCG tablet Take 1,000 mcg by mouth daily.    Yes Historical Provider, MD    Scheduled Meds: . aspirin  300 mg Rectal Daily   Or  . aspirin  325 mg Oral Daily  . heparin  5,000 Units Subcutaneous 3 times per day   Continuous Infusions: . sodium chloride 75 mL/hr at 12/27/14 0845   PRN Meds:.acetaminophen, fentaNYL (SUBLIMAZE) injection, hydrALAZINE     Results for orders placed or performed during the hospital encounter of 12/26/14 (from the past 48 hour(s))  Ethanol     Status: None   Collection Time: 12/26/14  2:17 PM  Result Value Ref Range   Alcohol, Ethyl (B) <5 <5 mg/dL    Comment:        LOWEST DETECTABLE  LIMIT FOR SERUM ALCOHOL IS 5 mg/dL FOR MEDICAL PURPOSES ONLY   Protime-INR     Status: None   Collection Time: 12/26/14  2:17 PM  Result Value Ref Range   Prothrombin Time 13.6 11.6 - 15.2 seconds   INR 1.02 0.00 - 1.49  APTT     Status: Abnormal   Collection Time: 12/26/14  2:17 PM  Result Value Ref Range   aPTT 21 (L) 24 - 37 seconds  CBC     Status: Abnormal   Collection Time: 12/26/14  2:17 PM  Result Value Ref Range   WBC 14.7 (H) 4.0 - 10.5 K/uL   RBC 4.89 3.87 - 5.11 MIL/uL   Hemoglobin 15.1 (H) 12.0 - 15.0 g/dL   HCT 44.3 36.0 - 46.0 %   MCV 90.6 78.0 - 100.0 fL   MCH 30.9 26.0 - 34.0 pg   MCHC 34.1 30.0 - 36.0 g/dL   RDW 13.3 11.5 - 15.5 %   Platelets 173 150 - 400 K/uL  Differential     Status: Abnormal   Collection Time: 12/26/14  2:17 PM  Result Value Ref Range   Neutrophils Relative % 75 43 - 77 %   Neutro Abs 10.9 (H) 1.7 - 7.7 K/uL   Lymphocytes Relative 14 12 - 46 %   Lymphs Abs 2.1 0.7 - 4.0 K/uL   Monocytes Relative 11 3 - 12 %   Monocytes Absolute 1.6 (H) 0.1 - 1.0 K/uL   Eosinophils Relative 0 0 - 5 %   Eosinophils Absolute 0.0 0.0 - 0.7 K/uL   Basophils Relative 0 0 - 1 %   Basophils Absolute 0.0 0.0 - 0.1 K/uL  Comprehensive metabolic panel     Status: Abnormal   Collection Time: 12/26/14  2:17 PM  Result Value Ref Range   Sodium 137 135 - 145 mmol/L   Potassium 3.6 3.5 - 5.1 mmol/L   Chloride 98 (L) 101 - 111 mmol/L   CO2 26 22 - 32 mmol/L   Glucose, Bld 136 (H) 65 - 99 mg/dL   BUN 27 (H) 6 - 20 mg/dL   Creatinine, Ser 1.14 (H) 0.44 - 1.00 mg/dL   Calcium 9.5 8.9 - 10.3 mg/dL   Total Protein 7.3 6.5 - 8.1  g/dL   Albumin 4.1 3.5 - 5.0 g/dL   AST 60 (H) 15 - 41 U/L   ALT 22 14 - 54 U/L   Alkaline Phosphatase 60 38 - 126 U/L   Total Bilirubin 0.9 0.3 - 1.2 mg/dL   GFR calc non Af Amer 44 (L) >60 mL/min   GFR calc Af Amer 51 (L) >60 mL/min    Comment: (NOTE) The eGFR has been calculated using the CKD EPI equation. This calculation has not  been validated in all clinical situations. eGFR's persistently <60 mL/min signify possible Chronic Kidney Disease.    Anion gap 13 5 - 15  I-stat troponin, ED (not at [facility], [facility])     Status: None   Collection Time: 12/26/14  2:22 PM  Result Value Ref Range   Troponin i, poc 0.01 0.00 - 0.08 ng/mL   Comment 3            Comment: Due to the release kinetics of cTnI, a negative result within the first hours of the onset of symptoms does not rule out myocardial infarction with certainty. If myocardial infarction is still suspected, repeat the test at appropriate intervals.   I-Stat Chem 8, ED  (not at [facility], [facility])     Status: Abnormal   Collection Time: 12/26/14  2:24 PM  Result Value Ref Range   Sodium 137 135 - 145 mmol/L   Potassium 3.7 3.5 - 5.1 mmol/L   Chloride 100 (L) 101 - 111 mmol/L   BUN 29 (H) 6 - 20 mg/dL   Creatinine, Ser 1.10 (H) 0.44 - 1.00 mg/dL   Glucose, Bld 136 (H) 65 - 99 mg/dL   Calcium, Ion 1.17 1.13 - 1.30 mmol/L   TCO2 23 0 - 100 mmol/L   Hemoglobin 16.0 (H) 12.0 - 15.0 g/dL   HCT 47.0 (H) 36.0 - 46.0 %  MRSA PCR Screening     Status: None   Collection Time: 12/26/14  7:11 PM  Result Value Ref Range   MRSA by PCR NEGATIVE NEGATIVE    Comment:        The GeneXpert MRSA Assay (FDA approved for NASAL specimens only), is one component of a comprehensive MRSA colonization surveillance program. It is not intended to diagnose MRSA infection nor to guide or monitor treatment for MRSA infections.   Lipid panel     Status: Abnormal   Collection Time: 12/27/14  5:43 AM  Result Value Ref Range   Cholesterol 195 0 - 200 mg/dL   Triglycerides 170 (H) <150 mg/dL   HDL 43 >40 mg/dL   Total CHOL/HDL Ratio 4.5 RATIO   VLDL 34 0 - 40 mg/dL   LDL Cholesterol 118 (H) 0 - 99 mg/dL    Comment:        Total Cholesterol/HDL:CHD Risk Coronary Heart Disease Risk Table                     Men   Women  1/2 Average Risk   3.4   3.3  Average Risk       5.0   4.4  2 X  Average Risk   9.6   7.1  3 X Average Risk  23.4   11.0        Use the calculated Patient Ratio above and the CHD Risk Table to determine the patient's CHD Risk.        ATP III CLASSIFICATION (LDL):  <100     mg/dL   Optimal  100-129  mg/dL   Near or Above                    Optimal  130-159  mg/dL   Borderline  160-189  mg/dL   High  >190     mg/dL   Very High   Antithrombin III     Status: None   Collection Time: 12/27/14 12:20 PM  Result Value Ref Range   AntiThromb III Func 103 75 - 120 %    Comment: Performed at [facility]  TSH     Status: Abnormal   Collection Time: 12/27/14 12:20 PM  Result Value Ref Range   TSH 0.300 (L) 0.350 - 4.500 uIU/mL    Studies/Results:  CTA BRAIN AND NECK 1. No emergent large vessel occlusion. Hemodynamically significant stenosis in distal left M2/M3 MCA branches probably due to thromboembolic disease. 2. Upstream moderate to high-grade stenoses of the left supraclinoid ICA (calcified plaque), and left ICA bulb (65-70%, calcified and soft plaque). 3. Stable CT changes of acute cortically based left MCA infarct (ASPECTS score of 7). 4. Moderate to high-grade right supraclinoid ICA stenosis. No other hemodynamically significant right carotid stenosis. 5. Dominant and dolichoectasia attic left vertebral artery with mild to moderate origin stenosis. Non dominant right vertebral artery with tandem high-grade stenoses in the V4 segment. The right PICA is likely supplied in a retrograde fashion from the left. Basilar artery dolichoectasia. 6. Ulcerated plaque in the proximal left subclavian artery without Stenosis.  CAROTID DOPPLERS FINE   HEAD CT No evidence of parenchymal hemorrhage or extra-axial fluid collection. No mass lesion, mass effect, or midline shift.  Hypodensity in the left insular cortex (series 2/image 16), suspicious for acute left MCA distribution infarct. Less than 1/3 of the left MCA territory is  involved.  Subcortical white matter and periventricular small vessel ischemic changes. Intracranial atherosclerosis.  Mild cortical atrophy. No ventriculomegaly.  The visualized paranasal sinuses are essentially clear. The mastoid air cells are unopacified.  No evidence of calvarial fracture.  IMPRESSION: Suspected acute left MCA distribution infarct, as above.  Philip Eckersley A. Merlene Dunlap, M.D.  Diplomate, Tax adviser of Psychiatry and Neurology ( Neurology). 12/27/2014, 7:46 PM

## 2014-12-27 NOTE — Care Management Note (Signed)
Case Management Note  Patient Details  Name: [name] MRN: [id number] Date of Birth: 1931-07-03  Subjective/Objective:                  Pt admitted from home with CVA. Pt currently living alone but will probably need SNF at discharge. Pt had a cane she used PTA and was still driving.  Action/Plan: Awaiting therapy recommendations. CSW is aware that pt will possibly need SNF at discharge.  Expected Discharge Date:                  Expected Discharge Plan:  Skilled Nursing Facility  In-House Referral:  Clinical Social Work  Discharge planning Services  NA  Post Acute Care Choice:  NA Choice offered to:  NA  DME Arranged:    DME Agency:     HH Arranged:    HH Agency:     Status of Service:  In process, will continue to follow  Medicare Important Message Given:    Date Medicare IM Given:    Medicare IM give by:    Date Additional Medicare IM Given:    Additional Medicare Important Message give by:     If discussed at Long Length of Stay Meetings, dates discussed:    Additional Comments:  Cheryl Flash, RN 12/27/2014, 2:07 PM

## 2014-12-27 NOTE — Progress Notes (Signed)
[facility] PROGRESS NOTE  [name] ZOX:[id number] DOB: 03/25/1931    PCP:   Donzetta Sprung, MD   HPI: [name] is an 81 y.o. female with hx of hypothyroidism, HLD, HTN, depression, CMP s/p implantable defibrillator, spinal stenosis, admitted for a large Left MCA territory CVA, with echolalia, expressive aphasia, but no significant right hemiplegia.  She cannot swallow, however.  She has been on DUAT (plavix and ASA) in the past, and had several cardiac stent placement before.  She was out of the window for TPA or mechanical thrombus extraction.  CTA of the cerebral vessel showed significant stenosis, but non large vessel stenosis.   Rewiew of Systems:  Constitutional: Negative for malaise, fever and chills. No significant weight loss or weight gain Eyes: Negative for eye pain, redness and discharge, diplopia, visual changes, or flashes of light. ENMT: Negative for ear pain, hoarseness, nasal congestion, sinus pressure and sore throat. No headaches; tinnitus, drooling, or problem swallowing. Cardiovascular: Negative for chest pain, palpitations, diaphoresis, dyspnea and peripheral edema. ; No orthopnea, PND Respiratory: Negative for cough, hemoptysis, wheezing and stridor. No pleuritic chestpain. Gastrointestinal: Negative for nausea, vomiting, diarrhea, constipation, abdominal pain, melena, blood in stool, hematemesis, jaundice and rectal bleeding.    Genitourinary: Negative for frequency, dysuria, incontinence,flank pain and hematuria; Musculoskeletal: Negative for back pain and neck pain. Negative for swelling and trauma.;  Skin: . Negative for pruritus, rash, abrasions, bruising and skin lesion.; ulcerations Neuro: Negative for headache, lightheadedness and neck stiffness. Negative for weakness, altered level of consciousness , altered mental status, extremity weakness, burning feet, involuntary movement, seizure and syncope.  Psych: negative for anxiety, depression,  insomnia, tearfulness, panic attacks, hallucinations, paranoia, suicidal or homicidal ideation    Past Medical History  Diagnosis Date  . Essential hypertension, benign   . Automatic implantable cardiac defibrillator in situ     [employer]  . Hyperlipidemia   . Hypothyroidism   . Depression   . Osteopenia   . Coronary atherosclerosis of native coronary artery     Stent to OM3, DES D1 and RCA 3/12  . B12 deficiency   . Spinal stenosis     Chronic back pain  . Ischemic cardiomyopathy     LVEF 35-40%  . Myocardial infarction     May 2006, March 2012    Past Surgical History  Procedure Laterality Date  . Cardiac defibrillator placement      [employer]  . Joint replacement    . Replacement total knee Bilateral     Right-April 2006, Left-April 2011  . Tonsillectomy  1952  . Appendectomy  1956  . Abdominal hysterectomy  1971  . Cataract extraction w/phaco Left 10/04/2013    Procedure: CATARACT EXTRACTION PHACO AND INTRAOCULAR LENS PLACEMENT (IOC);  Surgeon: Gemma Payor, MD;  Location: [facility];  Service: Ophthalmology;  Laterality: Left;  CDE 9.37  . Cataract extraction w/phaco Right 10/25/2013    Procedure: CATARACT EXTRACTION PHACO AND INTRAOCULAR LENS PLACEMENT (IOC);  Surgeon: Gemma Payor, MD;  Location: [facility];  Service: Ophthalmology;  Laterality: Right;  CDE 8.57  . Implantable cardioverter defibrillator (icd) generator change N/A 04/22/2014    Procedure: ICD GENERATOR CHANGE;  Surgeon: Marinus Maw, MD;  Location: [facility];  Service: Cardiovascular;  Laterality: N/A;    Medications:  HOME MEDS: Prior to Admission medications   Medication Sig Start Date End Date Taking? Authorizing Provider  acetaminophen (TYLENOL) 650 MG CR tablet Take 650 mg by mouth every 8 (eight) hours as  needed for pain.    Yes Historical Provider, MD  aspirin 81 MG tablet Take 81 mg by mouth daily.   Yes Historical Provider, MD  Calcium Carbonate-Vitamin D (CALTRATE 600+D) 600-400 MG-UNIT per tablet  Take 1 tablet by mouth daily.    Yes Historical Provider, MD  chlorthalidone (HYGROTON) 25 MG tablet Take 25 mg by mouth daily.    Yes Historical Provider, MD  Cholecalciferol (VITAMIN D) 2000 UNITS CAPS Take 2,000 Units by mouth daily.   Yes Historical Provider, MD  cloNIDine (CATAPRES) 0.1 MG tablet Take 0.1 mg by mouth at bedtime.    Yes Historical Provider, MD  clopidogrel (PLAVIX) 75 MG tablet Take 75 mg by mouth at bedtime.    Yes Historical Provider, MD  eplerenone (INSPRA) 25 MG tablet Take 25 mg by mouth at bedtime.    Yes Historical Provider, MD  estradiol (ESTRACE) 1 MG tablet Take 1 mg by mouth at bedtime.     Yes Historical Provider, MD  ezetimibe (ZETIA) 10 MG tablet Take 10 mg by mouth daily.   Yes Historical Provider, MD  levothyroxine (SYNTHROID) 112 MCG tablet Take 112 mcg by mouth daily.     Yes Historical Provider, MD  loratadine (CLARITIN) 10 MG tablet Take 10 mg by mouth daily as needed for allergies.   Yes Historical Provider, MD  losartan (COZAAR) 100 MG tablet Take 100 mg by mouth daily.   Yes Historical Provider, MD  metoprolol succinate (TOPROL-XL) 50 MG 24 hr tablet Take 1 tablet (50 mg total) by mouth daily. Take with or immediately following a meal. 04/23/14  Yes Jodelle Gross, NP  sertraline (ZOLOFT) 100 MG tablet Take 1/2 tab (50mg ) at bedtime    Yes Historical Provider, MD  vitamin B-12 (CYANOCOBALAMIN) 1000 MCG tablet Take 1,000 mcg by mouth daily.    Yes Historical Provider, MD     Allergies:  Allergies  Allergen Reactions  . Nsaids Other (See Comments)    Vomited blood   . Oxycodone Hcl Hives and Rash  . Povidone-Iodine Rash  . Prednisone Nausea Only and Other (See Comments)    FLU SYMPTOMS  . Amoxicillin-Pot Clavulanate Other (See Comments)    VOMITING  . Doxazosin Mesylate Other (See Comments)    INCONTINENCE   . Betadine [Povidone Iodine]     rash  . Cephalexin Rash  . Cymbalta [Duloxetine Hcl] Nausea Only and Other (See Comments)     Headache  . Gabapentin Rash  . Neomycin Rash  . Tape Other (See Comments)    Redness    Social History:   reports that she quit smoking about 4 years ago. Her smoking use included Cigarettes. She has a 12.5 pack-year smoking history. She has never used smokeless tobacco. She reports that she does not drink alcohol or use illicit drugs.  Family History: Family History  Problem Relation Age of Onset  . Stroke Mother   . Stroke Father   . Heart attack Sister   . Heart attack Brother   . Stroke Sister      Physical Exam: Filed Vitals:   12/27/14 0842 12/27/14 0915 12/27/14 1000 12/27/14 1100  BP:  131/64 130/67 129/73  Pulse:  74 79 96  Temp: 98.3 F (36.8 C)     TempSrc: Oral     Resp:  22 22 25   Height:      Weight:      SpO2:  96% 98% 96%   Blood pressure 129/73, pulse 96, temperature 98.3 F (  36.8 C), temperature source Oral, resp. rate 25, height 5\' 3"  (1.6 m), weight 72.3 kg (159 lb 6.3 oz), SpO2 96 %.  GEN:  Pleasant  patient lying in the stretcher in no acute distress; cooperative with exam. PSYCH:  alert and oriented x4; does not appear anxious or depressed; affect is appropriate. HEENT: Mucous membranes pink and anicteric; PERRLA; EOM intact; no cervical lymphadenopathy nor thyromegaly or carotid bruit; no JVD; There were no stridor. Neck is very supple. Breasts:: Not examined CHEST WALL: No tenderness CHEST: Normal respiration, clear to auscultation bilaterally.  HEART: Regular rate and rhythm.  There are no murmur, rub, or gallops.   BACK: No kyphosis or scoliosis; no CVA tenderness ABDOMEN: soft and non-tender; no masses, no organomegaly, normal abdominal bowel sounds; no pannus; no intertriginous candida. There is no rebound and no distention. Rectal Exam: Not done EXTREMITIES: No bone or joint deformity; age-appropriate arthropathy of the hands and knees; no edema; no ulcerations.  There is no calf tenderness. Genitalia: not examined PULSES: 2+ and  symmetric SKIN: Normal hydration no rash or ulceration CNS: Cranial nerves 2-12 grossly intact no focal lateralizing neurologic deficit.  Speech is fluent; uvula elevated with phonation, facial symmetry and tongue midline. DTR are normal bilaterally, cerebella exam is intact, barbinski is negative and strengths are equaled bilaterally.  No sensory loss.  She has expressive aphasia, Anomia, and echolalia.    Labs on Admission:  Basic Metabolic Panel:  Recent Labs Lab 12/26/14 1417 12/26/14 1424  NA 137 137  K 3.6 3.7  CL 98* 100*  CO2 26  --   GLUCOSE 136* 136*  BUN 27* 29*  CREATININE 1.14* 1.10*  CALCIUM 9.5  --    Liver Function Tests:  Recent Labs Lab 12/26/14 1417  AST 60*  ALT 22  ALKPHOS 60  BILITOT 0.9  PROT 7.3  ALBUMIN 4.1   No results for input(s): LIPASE, AMYLASE in the last 168 hours. No results for input(s): AMMONIA in the last 168 hours. CBC:  Recent Labs Lab 12/26/14 1417 12/26/14 1424  WBC 14.7*  --   NEUTROABS 10.9*  --   HGB 15.1* 16.0*  HCT 44.3 47.0*  MCV 90.6  --   PLT 173  --    Cardiac Enzymes: No results for input(s): CKTOTAL, CKMB, CKMBINDEX, TROPONINI in the last 168 hours.  CBG: No results for input(s): GLUCAP in the last 168 hours.   Radiological Exams on Admission: Ct Angio Head W/cm &/or Wo Cm  12/26/2014   CLINICAL DATA:  81 year old female altered mental status, found down. Code stroke with left insula changes on noncontrast head CT. Initial encounter.  EXAM: CT ANGIOGRAPHY HEAD AND NECK  TECHNIQUE: Multidetector CT imaging of the head and neck was performed using the standard protocol during bolus administration of intravenous contrast. Multiplanar CT image reconstructions and MIPs were obtained to evaluate the vascular anatomy. Carotid stenosis measurements (when applicable) are obtained utilizing NASCET criteria, using the distal internal carotid diameter as the denominator.  CONTRAST:  75mL OMNIPAQUE IOHEXOL 350 MG/ML SOLN   COMPARISON:  Noncontrast head CT 1409 hours today.  FINDINGS: CTA NECK  Skeleton: Absent dentition. Widespread disc, endplate, and facet degeneration in the cervical spine. Left chest cardiac pacemaker. No acute osseous abnormality identified. Mild left maxillary sinus mucosal thickening.  Other neck: Negative lung apices. No superior mediastinal lymphadenopathy.  Diminutive thyroid. Larynx, pharynx, parapharyngeal spaces, retropharyngeal space, sublingual space, submandibular glands, and parotid glands are within normal limits. Postoperative changes to the  globes. Visualized scalp soft tissues are within normal limits. No cervical lymphadenopathy.  Aortic arch: 3 vessel arch configuration. Moderate mostly calcified arch atherosclerosis. No great vessel origins stenosis.  Right carotid system: Tortuous brachiocephalic artery and proximal right CCA. Normal right CCA origin. Confluent calcified plaque at the right carotid bifurcation, but less than 50 % stenosis with respect to the distal vessel results. Negative cervical right ICA otherwise.  Left carotid system: No left CCA origin stenosis. Severely tortuous proximal left CCA with a kinked appearance (sagittal image 107 of series 607), but otherwise no proximal left carotid stenosis. Confluent calcified plaque at the left carotid bifurcation. Superimposed left ICA bulb soft plaque. Stenosis up to 65-70 percent occurs at the bulb (see sagittal image 121 of series 607). Beyond the bulb the cervical left ICA is tortuous but otherwise negative.  Vertebral arteries:Tortuous proximal right subclavian artery with a kinked appearance as well as some ulcerated appearing soft plaque (sagittal image 118 of series 607). Dominant left vertebral artery with calcified plaque at its origin and mild to moderate stenosis. Tortuous left V1 segment. Widely patent left vertebral artery otherwise to the skullbase.  No proximal right subclavian artery stenosis despite calcified plaque.  Non dominant and diminutive right vertebral artery with no stenosis at its origin. The right vertebral is highly diminutive and appears to terminate in muscular branches at the C2 level, but is been reconstituted at the C1 level and remains patent into the posterior fossa.  CTA HEAD  Posterior circulation: Dominant distal left vertebral artery is patent without stenosis and primarily supplies the basilar.  Multifocal high-grade segmental stenosis of the non dominant right V4 segment. High-grade stenosis just proximal to the PICA origin but the right PICA origin is patent and is probably supplied in a retrograde fashion from the vertebrobasilar junction.  Irregular basilar artery with atherosclerosis but no stenosis. Ectatic basilar tip. SCA and PCA origins are within normal limits. Posterior communicating arteries are diminutive or absent. Bilateral PCA branches are mildly irregular and patent.  Anterior circulation: Calcified plaque in both ICA siphons. This is maximal in the supraclinoid ICA segments. There is moderate to high-grade stenosis best seen on series 605, image 95. The left ICA terminus remains patent despite this finding. Moderate to severe stenosis also on the right (series 605, image 97). The right ICA terminus is patent.  Dominant left ACA A1 segment. Normal ACA origins. Normal anterior communicating artery. Bilateral ACA branches are within normal limits.  Right MCA origin and M1 segment is within normal limits. Right MCA bifurcation is patent. Right MCA branches are within normal limits.  Left MCA origin is patent. There is mild to moderate irregularity of the left MCA M1 segment but up to only mild stenosis (series 602, image 49). The left MCA bifurcation is patent. The proximal M2 branches are patent. There is attenuated flow in both middle and posterior M2/M3 branches.  Venous sinuses: Patent.  Anatomic variants: Dominant left vertebral artery.  Delayed phase: Continued hypodensity in the  insula and anterior left operculum, as well as asymmetric hypodensity in the subcortical white matter at the level of the corona radiata (ASPECTS score of 7). No abnormal enhancement identified. No intracranial mass effect at this time.  IMPRESSION: 1. No emergent large vessel occlusion. Hemodynamically significant stenosis in distal left M2/M3 MCA branches probably due to thromboembolic disease. 2. Upstream moderate to high-grade stenoses of the left supraclinoid ICA (calcified plaque), and left ICA bulb (65-70%, calcified and soft plaque). 3. Stable CT  changes of acute cortically based left MCA infarct (ASPECTS score of 7). 4. Moderate to high-grade right supraclinoid ICA stenosis. No other hemodynamically significant right carotid stenosis. 5. Dominant and dolichoectasia attic left vertebral artery with mild to moderate origin stenosis. Non dominant right vertebral artery with tandem high-grade stenoses in the V4 segment. The right PICA is likely supplied in a retrograde fashion from the left. Basilar artery dolichoectasia. 6. Ulcerated plaque in the proximal left subclavian artery without stenosis.   Electronically Signed   By: Odessa Fleming M.D.   On: 12/26/2014 15:57   Dg Chest 2 View  12/26/2014   CLINICAL DATA:  Patient found down at 1 p.m. today. Unresponsive. Initial encounter.  EXAM: CHEST  2 VIEW  COMPARISON:  PA and lateral chest 04/23/2014.  FINDINGS: AICD is in place. The lungs are clear. There is mild cardiomegaly. No pneumothorax or pleural effusion.  IMPRESSION: No acute disease.   Electronically Signed   By: Drusilla Kanner M.D.   On: 12/26/2014 18:49   Ct Head Wo Contrast  12/26/2014   CLINICAL DATA:  Code stroke, altered mental status  EXAM: CT HEAD WITHOUT CONTRAST  TECHNIQUE: Contiguous axial images were obtained from the base of the skull through the vertex without intravenous contrast.  COMPARISON:  12/26/2009  FINDINGS: No evidence of parenchymal hemorrhage or extra-axial fluid collection.  No mass lesion, mass effect, or midline shift.  Hypodensity in the left insular cortex (series 2/image 16), suspicious for acute left MCA distribution infarct. Less than 1/3 of the left MCA territory is involved.  Subcortical white matter and periventricular small vessel ischemic changes. Intracranial atherosclerosis.  Mild cortical atrophy.  No ventriculomegaly.  The visualized paranasal sinuses are essentially clear. The mastoid air cells are unopacified.  No evidence of calvarial fracture.  IMPRESSION: Suspected acute left MCA distribution infarct, as above.  Critical Value/emergent results were called by telephone at the time of interpretation on 12/26/2014 at 2:20 pm to Dr. Donnetta Hutching, who verbally acknowledged these results.   Electronically Signed   By: Charline Bills M.D.   On: 12/26/2014 14:23   Ct Angio Neck W/cm &/or Wo/cm  12/26/2014   CLINICAL DATA:  81 year old female altered mental status, found down. Code stroke with left insula changes on noncontrast head CT. Initial encounter.  EXAM: CT ANGIOGRAPHY HEAD AND NECK  TECHNIQUE: Multidetector CT imaging of the head and neck was performed using the standard protocol during bolus administration of intravenous contrast. Multiplanar CT image reconstructions and MIPs were obtained to evaluate the vascular anatomy. Carotid stenosis measurements (when applicable) are obtained utilizing NASCET criteria, using the distal internal carotid diameter as the denominator.  CONTRAST:  74mL OMNIPAQUE IOHEXOL 350 MG/ML SOLN  COMPARISON:  Noncontrast head CT 1409 hours today.  FINDINGS: CTA NECK  Skeleton: Absent dentition. Widespread disc, endplate, and facet degeneration in the cervical spine. Left chest cardiac pacemaker. No acute osseous abnormality identified. Mild left maxillary sinus mucosal thickening.  Other neck: Negative lung apices. No superior mediastinal lymphadenopathy.  Diminutive thyroid. Larynx, pharynx, parapharyngeal spaces, retropharyngeal space,  sublingual space, submandibular glands, and parotid glands are within normal limits. Postoperative changes to the globes. Visualized scalp soft tissues are within normal limits. No cervical lymphadenopathy.  Aortic arch: 3 vessel arch configuration. Moderate mostly calcified arch atherosclerosis. No great vessel origins stenosis.  Right carotid system: Tortuous brachiocephalic artery and proximal right CCA. Normal right CCA origin. Confluent calcified plaque at the right carotid bifurcation, but less than 50 % stenosis with  respect to the distal vessel results. Negative cervical right ICA otherwise.  Left carotid system: No left CCA origin stenosis. Severely tortuous proximal left CCA with a kinked appearance (sagittal image 107 of series 607), but otherwise no proximal left carotid stenosis. Confluent calcified plaque at the left carotid bifurcation. Superimposed left ICA bulb soft plaque. Stenosis up to 65-70 percent occurs at the bulb (see sagittal image 121 of series 607). Beyond the bulb the cervical left ICA is tortuous but otherwise negative.  Vertebral arteries:Tortuous proximal right subclavian artery with a kinked appearance as well as some ulcerated appearing soft plaque (sagittal image 118 of series 607). Dominant left vertebral artery with calcified plaque at its origin and mild to moderate stenosis. Tortuous left V1 segment. Widely patent left vertebral artery otherwise to the skullbase.  No proximal right subclavian artery stenosis despite calcified plaque. Non dominant and diminutive right vertebral artery with no stenosis at its origin. The right vertebral is highly diminutive and appears to terminate in muscular branches at the C2 level, but is been reconstituted at the C1 level and remains patent into the posterior fossa.  CTA HEAD  Posterior circulation: Dominant distal left vertebral artery is patent without stenosis and primarily supplies the basilar.  Multifocal high-grade segmental stenosis  of the non dominant right V4 segment. High-grade stenosis just proximal to the PICA origin but the right PICA origin is patent and is probably supplied in a retrograde fashion from the vertebrobasilar junction.  Irregular basilar artery with atherosclerosis but no stenosis. Ectatic basilar tip. SCA and PCA origins are within normal limits. Posterior communicating arteries are diminutive or absent. Bilateral PCA branches are mildly irregular and patent.  Anterior circulation: Calcified plaque in both ICA siphons. This is maximal in the supraclinoid ICA segments. There is moderate to high-grade stenosis best seen on series 605, image 95. The left ICA terminus remains patent despite this finding. Moderate to severe stenosis also on the right (series 605, image 97). The right ICA terminus is patent.  Dominant left ACA A1 segment. Normal ACA origins. Normal anterior communicating artery. Bilateral ACA branches are within normal limits.  Right MCA origin and M1 segment is within normal limits. Right MCA bifurcation is patent. Right MCA branches are within normal limits.  Left MCA origin is patent. There is mild to moderate irregularity of the left MCA M1 segment but up to only mild stenosis (series 602, image 49). The left MCA bifurcation is patent. The proximal M2 branches are patent. There is attenuated flow in both middle and posterior M2/M3 branches.  Venous sinuses: Patent.  Anatomic variants: Dominant left vertebral artery.  Delayed phase: Continued hypodensity in the insula and anterior left operculum, as well as asymmetric hypodensity in the subcortical white matter at the level of the corona radiata (ASPECTS score of 7). No abnormal enhancement identified. No intracranial mass effect at this time.  IMPRESSION: 1. No emergent large vessel occlusion. Hemodynamically significant stenosis in distal left M2/M3 MCA branches probably due to thromboembolic disease. 2. Upstream moderate to high-grade stenoses of the left  supraclinoid ICA (calcified plaque), and left ICA bulb (65-70%, calcified and soft plaque). 3. Stable CT changes of acute cortically based left MCA infarct (ASPECTS score of 7). 4. Moderate to high-grade right supraclinoid ICA stenosis. No other hemodynamically significant right carotid stenosis. 5. Dominant and dolichoectasia attic left vertebral artery with mild to moderate origin stenosis. Non dominant right vertebral artery with tandem high-grade stenoses in the V4 segment. The right PICA is likely  supplied in a retrograde fashion from the left. Basilar artery dolichoectasia. 6. Ulcerated plaque in the proximal left subclavian artery without stenosis.   Electronically Signed   By: Odessa Fleming M.D.   On: 12/26/2014 15:57    EKG: Independently reviewed. NSR with PAC.    Assessment/Plan Present on Admission:  . CVA (cerebral infarction) . Automatic implantable cardioverter-defibrillator in situ . Chronic systolic heart failure . Essential hypertension, benign  PLAN: She has been on DUAT (Plavix and ASA), and as soon as she can swallow, she will need to be back on these medicine.  She likely will benefit on max dose of a statin as well.   Her echolalia, expressive aphasia, likely will improve.  I will obtain thrombophilic panel in addition to her lipid panel.  Neurology has been consulted, and we await further recommendation.  Hopefully, her swallowing fx will return.  Will proceed with physical therapy.  Will allow permissive HTN.  For her hypothyroidism, will be able to follow her off thyroid supplement at this time.  She is a DNR. Thank you for allowing me to participate in her care.   Other plans as per orders.  Code Status: DNR.    Houston Siren, MD. [facility] Pager [phone] 7pm to 7am.  12/27/2014, 11:43 AM

## 2014-12-27 NOTE — Progress Notes (Signed)
UR chart review completed.  

## 2014-12-27 NOTE — Evaluation (Addendum)
Occupational Therapy Evaluation Patient Details Name: [name] MRN: [id number] DOB: 1931-07-11 Today's Date: 12/27/2014    History of Present Illness Pt is an 81 year old lady who apparently was last in her usual state yesterday evening. Today, at approximately 1 PM she was found in her home on the floor. She was disorientated and almost unresponsive. The patient has a history of hypertension, cardiomyopathy, coronary artery disease, status post implanted defibrillator. The patient herself cannot give me any clear history. Evaluation in the emergency room with a CT scan shows CVA, fairly sizable, affecting the left MCA distribution. She appears to have hemodynamically significant stenosis in the distal left MCA branches probably due to thromboembolic disease. She is now being admitted for further management.   Clinical Impression   PTA pt lived at home and was independent in all B/IADLs per sister report. Pt has sister and brother nearby; daughter & son-in-law in [place]. Pt with receptive and expressive difficulties during evaluation. Pt able to follow some simple commands. Pt demonstrates RUE range of motion WNL, strength 3/5. Pt would benefit from continued acute OT services to further assess and provide skilled instruction in B/IADL tasks.     Follow Up Recommendations  SNF;Supervision/Assistance - 24 hour    Equipment Recommendations  Tub/shower seat       Precautions / Restrictions Precautions Precautions: Fall Restrictions Weight Bearing Restrictions: No              ADL Overall ADL's : Needs assistance/impaired                                       General ADL Comments: To be further assessed during treatments. Pt unable to understand and follow through with instructions during evaluation               Pertinent Vitals/Pain Pain Assessment: No/denies pain     Hand Dominance Right   Extremity/Trunk Assessment Upper Extremity  Assessment Upper Extremity Assessment: RUE deficits/detail RUE Deficits / Details: RUE range of motion WFL. Strength 3/5 RUE Coordination:  (difficult to assess due to receptive difficulties)   Lower Extremity Assessment Lower Extremity Assessment: Defer to PT evaluation       Communication Communication Communication: Receptive difficulties;Expressive difficulties   Cognition Arousal/Alertness: Awake/alert Behavior During Therapy: WFL for tasks assessed/performed Overall Cognitive Status: Within Functional Limits for tasks assessed                                Home Living Family/patient expects to be discharged to:: Skilled nursing facility Living Arrangements: Alone                                      Prior Functioning/Environment Level of Independence: Independent with assistive device(s)        Comments: Used single point cane during functional mobility at home    OT Diagnosis: Generalized weakness;Cognitive deficits   OT Problem List: Decreased strength;Decreased activity tolerance;Decreased coordination;Decreased safety awareness   OT Treatment/Interventions: Self-care/ADL training;Therapeutic exercise;Manual therapy;Modalities;Therapeutic activities;Patient/family education;Cognitive remediation/compensation    OT Goals(Current goals can be found in the care plan section) Acute Rehab OT Goals Patient Stated Goal: none OT Goal Formulation: With patient/family Time For Goal Achievement: 01/10/15 Potential to Achieve Goals: Good  OT Frequency: Min 2X/week  End of Session    Activity Tolerance: Patient tolerated treatment well Patient left: in bed;with call bell/phone within reach;with bed alarm set;with family/visitor present   Time: 1610-9604 OT Time Calculation (min): 24 min Charges:  OT General Charges $OT Visit: 1 Procedure OT Evaluation $Initial OT Evaluation Tier I: 1 Procedure  Ezra Sites, OTR/L  [phone]   12/27/2014, 3:02 PM

## 2014-12-27 NOTE — Clinical Social Work Note (Signed)
Clinical Social Work Assessment  Patient Details  Name: [name] MRN: [id number] Date of Birth: [birth year]  Date of referral:  12/27/14               Reason for consult:  Facility Placement                Permission sought to share information with:  Family Supports Permission granted to share information::  Yes, Verbal Permission Granted  Name::     Building surveyor::     Relationship::  daughter  Contact Information:     Housing/Transportation Living arrangements for the past 2 months:  [place] of Information:  Patient, Adult Children Patient Interpreter Needed:  None Criminal Activity/Legal Involvement Pertinent to Current Situation/Hospitalization:  No - Comment as needed Significant Relationships:  Adult Children, Siblings Lives with:  Self Do you feel safe going back to the place where you live?  No Need for family participation in patient care:  Yes (Comment)  Care giving concerns:  Pt lives alone.    Social Worker assessment / plan:  CSW met with pt at bedside along with pt's daughter, son-in-law, and sister. Pt alert at beginning of assessment, but did not participate much. [name], [name] provided most of information for assessment. [name] reports that pt lives alone and has been independent with ADLs and still driving. Yesterday her brother became concerned after she did not answer the phone several times and found pt on floor. Admitted with stroke. Pt's children both live near [place]. Her local support includes several siblings. Family share that pt's parents both had a stroke. Awaiting PT evaluation, but family anticipate need for SNF at d/c. Pt has never been to SNF before. CSW discussed placement process and provided SNF list. [name] plans to discuss with her brother in order to decide if they will pursue local placement or in [place]. CSW agreed to follow up in AM. Family aware of Medicare coverage/criteria.   Employment status:   Retired Forensic scientist:  Medicare PT Recommendations:  [place] / Referral to community resources:  [place]  Patient/Family's Response to care:  Pt did not participate much in conversation. Family appears overwhelmed with stroke and decisions to be made.   Patient/Family's Understanding of and Emotional Response to Diagnosis, Current Treatment, and Prognosis:  Pt's family is aware of diagnosis and need for SNF as part of treatment plan. They are concerned with how long pt will need to be at rehab and whether to consider placement here or in [place]. Brief support provided.    Emotional Assessment Appearance:  Appears stated age Attitude/Demeanor/Rapport:  Unable to Assess (difficult to assess due to expressive aphasia) Affect (typically observed):  Unable to Assess (difficult to assess due to expressive aphasia) Orientation:  Oriented to Self Alcohol / Substance use:  Not Applicable Psych involvement (Current and /or in the community):  No (Comment)  Discharge Needs  Concerns to be addressed:  Discharge Planning Concerns, Adjustment to Illness Readmission within the last 30 days:    Current discharge risk:  Lives alone Barriers to Discharge:  Continued Medical Work up   [employer], [employer], [place] 12/27/2014, 3:27 PM [phone]

## 2014-12-27 NOTE — Evaluation (Signed)
Physical Therapy Evaluation Patient Details Name: [name] MRN: [id number] DOB: 1930/09/29 Today's Date: 12/27/2014   History of Present Illness  Pt is an 81 year old lady who apparently was last in her usual state yesterday evening. Today, at approximately 1 PM she was found in her home on the floor. She was disorientated and almost unresponsive. The patient has a history of hypertension, cardiomyopathy, coronary artery disease, status post implanted defibrillator. The patient herself cannot give me any clear history. Evaluation in the emergency room with a CT scan shows CVA, fairly sizable, affecting the left MCA distribution. She appears to have hemodynamically significant stenosis in the distal left MCA branches probably due to thromboembolic disease. She is now being admitted for further management.  Clinical Impression   Pt was seen for evaluation.  She was alert and cooperative, able to follow most directions.  Pt has been living alone, independent with all ADLs.  Currently, her expressive aphasia appears to be the dominant problem.  She did exhibit receptive aphasia with me and was unable to understand me.  If I demonstrated the task for her instead of using words she had no difficulty.  She is moving all extremities with mild decrease of strength in the RLE.  She recognizes her right side and was able to transfer supine to sit with just min assist.  Her sitting balance is good.  She stood at bedside with good stability and took a few steps with hand held assist to a chair.  I anticipate that she will get a full return of all gross motor skills.  I am recommending SNF at d/c.    Follow Up Recommendations SNF    Equipment Recommendations  None recommended by PT    Recommendations for Other Services  OT. ST     Precautions / Restrictions Precautions Precautions: Fall Restrictions Weight Bearing Restrictions: No      Mobility  Bed Mobility Overal bed mobility: Needs  Assistance Bed Mobility: Supine to Sit     Supine to sit: Min assist;HOB elevated        Transfers Overall transfer level: Needs assistance Equipment used: None Transfers: Sit to/from Stand Sit to Stand: Min assist            Ambulation/Gait Ambulation/Gait assistance: Min assist Ambulation Distance (Feet): 3 Feet Assistive device: 1 person hand held assist       General Gait Details: unable to assess gait due to short distance stepped...she will probably need a walker initially for gait of any distance                Modified Rankin (Stroke Patients Only) Modified Rankin (Stroke Patients Only) Pre-Morbid Rankin Score: No symptoms Modified Rankin: Moderate disability     Balance Overall balance assessment:  (good sitting balance, did not formally test standing balance but it does not appear significantly decreased)                                           Pertinent Vitals/Pain Pain Assessment: No/denies pain    Home Living Family/patient expects to be discharged to:: Skilled nursing facility Living Arrangements: Alone                    Prior Function Level of Independence: Independent with assistive device(s)         Comments: Used single point cane during functional mobility  at home     Hand Dominance   Dominant Hand: Right    Extremity/Trunk Assessment   Upper Extremity Assessment: Defer to OT evaluation RUE Deficits / Details: RUE range of motion WFL. Strength 3/5         Lower Extremity Assessment: RLE deficits/detail RLE Deficits / Details: strength generally 3/5    Cervical / Trunk Assessment: Normal  Communication   Communication: Receptive difficulties;Expressive difficulties  Cognition Arousal/Alertness: Awake/alert Behavior During Therapy: WFL for tasks assessed/performed Overall Cognitive Status: Within Functional Limits for tasks assessed                                     Assessment/Plan    PT Assessment Patient needs continued PT services  PT Diagnosis Difficulty walking;Hemiplegia dominant side   PT Problem List Decreased strength;Decreased activity tolerance;Decreased mobility;Decreased knowledge of use of DME  PT Treatment Interventions DME instruction;Gait training;Functional mobility training;Therapeutic exercise   PT Goals (Current goals can be found in the Care Plan section) Acute Rehab PT Goals Patient Stated Goal: none stated PT Goal Formulation: With patient/family Time For Goal Achievement: 01/10/15 Potential to Achieve Goals: Good    Frequency Min 6X/week   Barriers to discharge Decreased caregiver support lives alone                   End of Session Equipment Utilized During Treatment: Gait belt Activity Tolerance: Patient tolerated treatment well Patient left: in chair;with call bell/phone within reach;with family/visitor present Nurse Communication: Mobility status         Time: 1610-1650 PT Time Calculation (min) (ACUTE ONLY): 40 min   Charges:   PT Evaluation $Initial PT Evaluation Tier I: 1 Procedure     PT G CodesMyrlene Broker L  PT 12/27/2014, 4:59 PM [phone]

## 2014-12-27 NOTE — Evaluation (Signed)
Clinical/Bedside Swallow Evaluation Patient Details  Name: [name] MRN: [id number] Date of Birth: August 30, 1930  Today's Date: 12/27/2014 Time: SLP Start Time (ACUTE ONLY): 1332 SLP Stop Time (ACUTE ONLY): 1352 SLP Time Calculation (min) (ACUTE ONLY): 20 min  Past Medical History:  Past Medical History  Diagnosis Date  . Essential hypertension, benign   . Automatic implantable cardiac defibrillator in situ     Medtronic  . Hyperlipidemia   . Hypothyroidism   . Depression   . Osteopenia   . Coronary atherosclerosis of native coronary artery     Stent to OM3, DES D1 and RCA 3/12  . B12 deficiency   . Spinal stenosis     Chronic back pain  . Ischemic cardiomyopathy     LVEF 35-40%  . Myocardial infarction     May 2006, March 2012   Past Surgical History:  Past Surgical History  Procedure Laterality Date  . Cardiac defibrillator placement      Medtronic  . Joint replacement    . Replacement total knee Bilateral     Right-April 2006, Left-April 2011  . Tonsillectomy  1952  . Appendectomy  1956  . Abdominal hysterectomy  1971  . Cataract extraction w/phaco Left 10/04/2013    Procedure: CATARACT EXTRACTION PHACO AND INTRAOCULAR LENS PLACEMENT (IOC);  Surgeon: Gemma Payor, MD;  Location: [facility];  Service: Ophthalmology;  Laterality: Left;  CDE 9.37  . Cataract extraction w/phaco Right 10/25/2013    Procedure: CATARACT EXTRACTION PHACO AND INTRAOCULAR LENS PLACEMENT (IOC);  Surgeon: Gemma Payor, MD;  Location: [facility];  Service: Ophthalmology;  Laterality: Right;  CDE 8.57  . Implantable cardioverter defibrillator (icd) generator change N/A 04/22/2014    Procedure: ICD GENERATOR CHANGE;  Surgeon: Marinus Maw, MD;  Location: [facility] CATH [facility];  Service: Cardiovascular;  Laterality: N/A;   HPI:  [name] is an 81 y.o. female with hx of hypothyroidism, HLD, HTN, depression, CMP s/p implantable defibrillator, spinal stenosis, admitted for a large Left MCA territory CVA, with  echolalia, expressive aphasia, but no significant right hemiplegia. She cannot swallow, however. She has been on DUAT (plavix and ASA) in the past, and had several cardiac stent placement before. She was out of the window for TPA or mechanical thrombus extraction. CTA of the cerebral vessel showed significant stenosis, but non large vessel stenosis.    Assessment / Plan / Recommendation Clinical Impression  [name] was sleeping soundly upon arrival, but awoke with encouragement from family. Oral motor examination was limited due to aphasia and apraxia, however pt did well in functional tasks. Interestingly, she has some left facial/labial asymmetry in setting of left CVA. She was unable to follow simple commands when out of context and without cue, however when modeled by SLP she was able to follow most commands. She needed guidance for self feeding (for control of rate and amount) sips of water, but shows no overt signs of aspiration. Recommend initiating diet of puree and thin liquids with 1:1 feeder assist/supervision for self feeding. Straws ok. Meds whole in puree ok. Cue pt to take small sips at slow rate. SLP will follow for language evaluation and dysphagia. Pt was living independently prior to this stroke. Recommend acute rehab to maximize recovery and decrease burden of care.     Aspiration Risk  Mild    Diet Recommendation Dysphagia 1 (Puree);Thin   Medication Administration: Whole meds with puree Compensations: Slow rate;Small sips/bites    Other  Recommendations Oral Care Recommendations: Oral  care BID Other Recommendations: Clarify dietary restrictions   Follow Up Recommendations       Frequency and Duration min 2x/week  1 week   Pertinent Vitals/Pain VSS   SLP Swallow Goals   Pt will demonstrate safe and efficient consumption of least restrictive diet with use of strategies as needed.    Swallow Study Prior Functional Status       General Date of Onset:  12/26/14 Other Pertinent Information: [name] is an 81 y.o. female with hx of hypothyroidism, HLD, HTN, depression, CMP s/p implantable defibrillator, spinal stenosis, admitted for a large Left MCA territory CVA, with echolalia, expressive aphasia, but no significant right hemiplegia. She cannot swallow, however. She has been on DUAT (plavix and ASA) in the past, and had several cardiac stent placement before. She was out of the window for TPA or mechanical thrombus extraction. CTA of the cerebral vessel showed significant stenosis, but non large vessel stenosis.  Type of Study: Bedside swallow evaluation Diet Prior to this Study: NPO Temperature Spikes Noted: No Respiratory Status: Supplemental O2 delivered via (comment) History of Recent Intubation: No Behavior/Cognition: Alert;Cooperative;Pleasant mood;Requires cueing Oral Cavity - Dentition:  (dentures) Self-Feeding Abilities: Able to feed self;Needs assist Patient Positioning: Upright in bed Baseline Vocal Quality: Normal Volitional Cough: Cognitively unable to elicit (apraxia) Volitional Swallow: Unable to elicit    Oral/Motor/Sensory Function Overall Oral Motor/Sensory Function: Impaired Labial ROM: Reduced left Labial Strength: Within Functional Limits Labial Sensation: Reduced (right side) Lingual ROM: Within Functional Limits Lingual Symmetry: Within Functional Limits Lingual Strength: Reduced Lingual Sensation: Within Functional Limits Facial ROM: Reduced left Velum:  (reduced bilaterally) Mandible: Within Functional Limits   Ice Chips Ice chips: Within functional limits Presentation: Spoon   Thin Liquid Thin Liquid: Within functional limits Presentation: Cup;Self Fed;Straw (guidance from SLP)    Nectar Thick Nectar Thick Liquid: Not tested   Honey Thick Honey Thick Liquid: Not tested   Puree Puree: Within functional limits Presentation: Spoon   Solid  Thank you,  Havery Moros, CCC-SLP [phone]      Solid: Not tested       PORTER,DABNEY 12/27/2014,3:22 PM

## 2014-12-28 ENCOUNTER — Inpatient Hospital Stay (HOSPITAL_COMMUNITY): Payer: Medicare Other

## 2014-12-28 DIAGNOSIS — R4701 Aphasia: Secondary | ICD-10-CM | POA: Diagnosis present

## 2014-12-28 DIAGNOSIS — I10 Essential (primary) hypertension: Secondary | ICD-10-CM

## 2014-12-28 DIAGNOSIS — I5022 Chronic systolic (congestive) heart failure: Secondary | ICD-10-CM

## 2014-12-28 DIAGNOSIS — I63312 Cerebral infarction due to thrombosis of left middle cerebral artery: Secondary | ICD-10-CM

## 2014-12-28 DIAGNOSIS — E039 Hypothyroidism, unspecified: Secondary | ICD-10-CM | POA: Diagnosis present

## 2014-12-28 LAB — LIPID PANEL
Cholesterol: 207 mg/dL — ABNORMAL HIGH (ref 0–200)
HDL: 41 mg/dL (ref >40)
LDL CALC: 121 mg/dL — AB (ref 0–99)
TRIGLYCERIDES: 225 mg/dL — AB (ref <150)
Total CHOL/HDL Ratio: 5 RATIO
VLDL: 45 mg/dL — ABNORMAL HIGH (ref 0–40)

## 2014-12-28 LAB — BETA-2-GLYCOPROTEIN I ABS, IGG/M/A
Beta-2-Glycoprotein I IgA: <9 GPI IgA units (ref 0–25)
Beta-2-Glycoprotein I IgM: <9 GPI IgM units (ref 0–32)

## 2014-12-28 LAB — HEMOGLOBIN A1C
Hgb A1c MFr Bld: 6.3 % — ABNORMAL HIGH (ref 4.8–5.6)
MEAN PLASMA GLUCOSE: 134 mg/dL

## 2014-12-28 LAB — PROTEIN C, TOTAL: Protein C, Total: 77 % (ref 70–140)

## 2014-12-28 LAB — HOMOCYSTEINE: Homocysteine: 10.8 umol/L (ref 0.0–15.0)

## 2014-12-28 MED ORDER — ATORVASTATIN CALCIUM 10 MG PO TABS
10.0000 mg | ORAL_TABLET | Freq: Every day | ORAL | Status: DC
  Administered 2014-12-28: 10 mg via ORAL
  Filled 2014-12-28: qty 1

## 2014-12-28 MED ORDER — HYDROCODONE-ACETAMINOPHEN 5-325 MG PO TABS
1.0000 | ORAL_TABLET | Freq: Four times a day (QID) | ORAL | Status: DC | PRN
  Administered 2014-12-28: 1 via ORAL
  Filled 2014-12-28: qty 1

## 2014-12-28 MED ORDER — SERTRALINE HCL 50 MG PO TABS
50.0000 mg | ORAL_TABLET | Freq: Every day | ORAL | Status: DC
  Administered 2014-12-28: 50 mg via ORAL
  Filled 2014-12-28: qty 1

## 2014-12-28 MED ORDER — METOPROLOL SUCCINATE ER 50 MG PO TB24
50.0000 mg | ORAL_TABLET | Freq: Every day | ORAL | Status: DC
  Administered 2014-12-28 – 2014-12-29 (×2): 50 mg via ORAL
  Filled 2014-12-28 (×3): qty 1

## 2014-12-28 MED ORDER — CLOPIDOGREL BISULFATE 75 MG PO TABS
75.0000 mg | ORAL_TABLET | Freq: Every day | ORAL | Status: DC
  Administered 2014-12-28: 75 mg via ORAL
  Filled 2014-12-28: qty 1

## 2014-12-28 MED ORDER — LEVOTHYROXINE SODIUM 112 MCG PO TABS
112.0000 ug | ORAL_TABLET | Freq: Every day | ORAL | Status: DC
  Administered 2014-12-28 – 2014-12-29 (×2): 112 ug via ORAL
  Filled 2014-12-28 (×2): qty 1

## 2014-12-28 MED ORDER — PNEUMOCOCCAL VAC POLYVALENT 25 MCG/0.5ML IJ INJ
0.5000 mL | INJECTION | INTRAMUSCULAR | Status: DC
  Filled 2014-12-28: qty 0.5

## 2014-12-28 NOTE — Progress Notes (Signed)
Occupational Therapy Treatment Patient Details Name: [name] MRN: [id number] DOB: [birth year] Today's Date: 12/28/2014    History of present illness Pt is an 81 year old lady who apparently was last in her usual state yesterday evening. Today, at approximately 1 PM she was found in her home on the floor. She was disorientated and almost unresponsive. The patient has a history of hypertension, cardiomyopathy, coronary artery disease, status post implanted defibrillator. The patient herself cannot give me any clear history. Evaluation in the emergency room with a CT scan shows CVA, fairly sizable, affecting the left MCA distribution. She appears to have hemodynamically significant stenosis in the distal left MCA branches probably due to thromboembolic disease. She is now being admitted for further management.   OT comments  Pt sleeping upon OT arrival this am. Pt daughter present. Pt had mod difficulty waking up and becoming alert, daughter reports pt had been sleeping soundly since she had pain medicine during the night. Pt required min guard during bed mobility supine->sit. Pt grimacing and reporting pain in right hip area. Nsg provided pain medication during treatment. Mod assist required for sit->stand, min assist and verbal cuing for functional mobility to chair using two wheeled walker for support due to right hip pain. Pt required set-up for breakfast, able to use right hand for feeding, pt demonstrates good coordination this am. Pt required verbal cuing to take smaller bites.    Follow Up Recommendations  SNF    Equipment Recommendations  Tub/shower seat       Precautions / Restrictions Precautions Precautions: Fall Restrictions Weight Bearing Restrictions: No       Mobility Bed Mobility Overal bed mobility: Needs Assistance Bed Mobility: Supine to Sit     Supine to sit: Min guard        Transfers Overall transfer level: Needs assistance Equipment used: Rolling  walker (2 wheeled) Transfers: Sit to/from Stand Sit to Stand: Mod assist         General transfer comment: Mod assist to stand due to pain in right hip        ADL Overall ADL's : Needs assistance/impaired Eating/Feeding: Set up;Supervision/ safety                                   Functional mobility during ADLs: Minimal assistance;Rolling walker;Cueing for sequencing                  Cognition   Behavior During Therapy: [facility] for tasks assessed/performed Overall Cognitive Status: Within Functional Limits for tasks assessed                                    Pertinent Vitals/ Pain       Pain Assessment: Faces Faces Pain Scale: Hurts whole lot Pain Location: right hip  Pain Descriptors / Indicators: Grimacing;Sharp Pain Intervention(s): Limited activity within patient's tolerance;Monitored during session;RN gave pain meds during session         Frequency Min 2X/week     Progress Toward Goals   Progress towards OT goals: Progressing toward goals     Plan Discharge plan remains appropriate       End of Session Equipment Utilized During Treatment: Gait belt;Rolling walker   Activity Tolerance Patient limited by pain   Patient Left in chair;with call bell/phone within reach;with family/visitor present  Time: 0981-1914 OT Time Calculation (min): 47 min  Charges: OT General Charges $OT Visit: 1 Procedure OT Treatments $Self Care/Home Management : 38-52 mins  Ezra Sites, OTR/L  [phone]  12/28/2014, 9:40 AM

## 2014-12-28 NOTE — Progress Notes (Signed)
Physical Therapy Treatment Patient Details Name: [name] MRN: [id number] DOB: 12-Oct-1930 Today's Date: 12/28/2014    History of Present Illness Pt is an 81 year old lady who apparently was last in her usual state yesterday evening. Today, at approximately 1 PM she was found in her home on the floor. She was disorientated and almost unresponsive. The patient has a history of hypertension, cardiomyopathy, coronary artery disease, status post implanted defibrillator. The patient herself cannot give me any clear history. Evaluation in the emergency room with a CT scan shows CVA, fairly sizable, affecting the left MCA distribution. She appears to have hemodynamically significant stenosis in the distal left MCA branches probably due to thromboembolic disease. She is now being admitted for further management.    PT Comments    Pt was seen at bedside, very drowsy.  Family member states that she has a large bruise over the right hip from her recent fall at home and it has been causing her pain today.  RN has medicated her for pain and pt is now drowsy.  Pt indicated that she would like to try to work with me so we initiated some general strengthening exercise in the bed but she was unable to stay awake.  Will try again tomorrow.  Follow Up Recommendations  SNF     Equipment Recommendations  None recommended by PT    Recommendations for Other Services OT consult;Speech consult     Precautions / Restrictions Precautions Precautions: Fall Restrictions Weight Bearing Restrictions: No    Mobility  Bed Mobility Overal bed mobility: Needs Assistance Bed Mobility: Supine to Sit     Supine to sit: Min guard     General bed mobility comments: pt is too drowsy to mobilize today  Transfers Overall transfer level: Needs assistance Equipment used: Rolling walker (2 wheeled) Transfers: Sit to/from Stand Sit to Stand: Mod assist         General transfer comment: Mod assist to stand  due to pain in right hip  Ambulation/Gait                 Stairs            Wheelchair Mobility    Modified Rankin (Stroke Patients Only)       Balance                                    Cognition Arousal/Alertness: Awake/alert Behavior During Therapy: WFL for tasks assessed/performed Overall Cognitive Status: Within Functional Limits for tasks assessed                      Exercises General Exercises - Lower Extremity Short Arc Quad: AROM;AAROM;Right;10 reps;Supine Heel Slides: AROM;AAROM;Right;10 reps;Supine Hip ABduction/ADduction: AROM;AAROM;Right;10 reps;Supine    General Comments        Pertinent Vitals/Pain Pain Assessment: No/denies pain Faces Pain Scale: Hurts whole lot Pain Location: right hip  Pain Descriptors / Indicators: Grimacing;Sharp Pain Intervention(s): Limited activity within patient's tolerance;Monitored during session;RN gave pain meds during session    Home Living                      Prior Function            PT Goals (current goals can now be found in the care plan section) Progress towards PT goals: Not progressing toward goals - comment (very drowsy)    Frequency  PT Plan Current plan remains appropriate    Co-evaluation             End of Session   Activity Tolerance: Patient limited by fatigue Patient left: in bed;with call bell/phone within reach;with family/visitor present     Time: 1100-1115 PT Time Calculation (min) (ACUTE ONLY): 15 min  Charges:  $Therapeutic Exercise: 8-22 mins                    G CodesKonrad Penta  PT 12/28/2014, 11:54 AM [phone]

## 2014-12-28 NOTE — Evaluation (Signed)
Speech Language Pathology Evaluation Patient Details Name: [name] MRN: [id number] DOB: July 31, 1930 Today's Date: 12/28/2014 Time: 4098-1191 SLP Time Calculation (min) (ACUTE ONLY): 38 min  Problem List:  Patient Active Problem List   Diagnosis Date Noted  . Hypothyroidism 12/28/2014  . Expressive aphasia 12/28/2014  . Acute ischemic left MCA stroke 12/26/2014  . Exertional dyspnea 05/23/2014  . Spider veins of both lower extremities 05/10/2014  . Varicose veins of leg with complications 05/10/2014  . ICD (implantable cardioverter-defibrillator) in place 04/22/2014  . VF (ventricular fibrillation) 03/29/2014  . Mixed hyperlipidemia 04/21/2013  . Chronic systolic heart failure 03/26/2013  . Ischemic cardiomyopathy   . Coronary atherosclerosis of native coronary artery   . COPD 02/27/2009  . SPINAL STENOSIS 02/27/2009  . Essential hypertension, benign 02/01/2009  . Automatic implantable cardioverter-defibrillator in situ 02/01/2009   Past Medical History:  Past Medical History  Diagnosis Date  . Essential hypertension, benign   . Automatic implantable cardiac defibrillator in situ     [employer]  . Hyperlipidemia   . Hypothyroidism   . Depression   . Osteopenia   . Coronary atherosclerosis of native coronary artery     Stent to OM3, DES D1 and RCA 3/12  . B12 deficiency   . Spinal stenosis     Chronic back pain  . Ischemic cardiomyopathy     LVEF 35-40%  . Myocardial infarction     May 2006, March 2012   Past Surgical History:  Past Surgical History  Procedure Laterality Date  . Cardiac defibrillator placement      [employer]  . Joint replacement    . Replacement total knee Bilateral     Right-April 2006, Left-April 2011  . Tonsillectomy  1952  . Appendectomy  1956  . Abdominal hysterectomy  1971  . Cataract extraction w/phaco Left 10/04/2013    Procedure: CATARACT EXTRACTION PHACO AND INTRAOCULAR LENS PLACEMENT (IOC);  Surgeon: Gemma Payor, MD;  Location:  [facility];  Service: Ophthalmology;  Laterality: Left;  CDE 9.37  . Cataract extraction w/phaco Right 10/25/2013    Procedure: CATARACT EXTRACTION PHACO AND INTRAOCULAR LENS PLACEMENT (IOC);  Surgeon: Gemma Payor, MD;  Location: [facility];  Service: Ophthalmology;  Laterality: Right;  CDE 8.57  . Implantable cardioverter defibrillator (icd) generator change N/A 04/22/2014    Procedure: ICD GENERATOR CHANGE;  Surgeon: Marinus Maw, MD;  Location: [facility] CATH LAB;  Service: Cardiovascular;  Laterality: N/A;   HPI:  [name] is an 81 y.o. female with hx of hypothyroidism, HLD, HTN, depression, CMP s/p implantable defibrillator, spinal stenosis, admitted for a large Left MCA territory CVA, with echolalia, expressive aphasia, but no significant right hemiplegia. She cannot swallow, however. She has been on DUAT (plavix and ASA) in the past, and had several cardiac stent placement before. She was out of the window for TPA or mechanical thrombus extraction. CTA of the cerebral vessel showed significant stenosis, but non large vessel stenosis.    Assessment / Plan / Recommendation Clinical Impression  Pt appearing moderately aphasic with deficits in both expressive and receptive language. Simple yes no questions less than 25 percent accurate. Unable to use formal cognitive screener to assess memory, problem solving, executive function, etc. secondary to moderate to severe comprehension issues however suspect cogntive involvment as well. Graphic expression and literacy also impacted by large left MCA infarct.  Pt stimulabe for increased expressive automatic and spontaneous speech with melodic intonation therapy approach as well as echolalic speech at one and two  syllabic word level.  Some spontaneous speech verbalized at the phrase level, often times not meaningful to context. Suspect mild motor (apraxic) involvmenet with groping noted intermittently on words/syllables. ST will continue to follow for continued  cognitive linguistic intervention.    SLP Assessment       Follow Up Recommendations       Frequency and Duration        Pertinent Vitals/Pain Pain Assessment: No/denies pain   SLP Goals     SLP Evaluation Prior Functioning  Cognitive/Linguistic Baseline: Within functional limits Type of Home: House  Lives With: Alone   Cognition  Overall Cognitive Status: Impaired/Different from baseline Arousal/Alertness: Awake/alert Orientation Level: Oriented to person (pt acknowledges and says yes to name being called)    Comprehension  Auditory Comprehension Overall Auditory Comprehension: Impaired Yes/No Questions: Impaired Basic Biographical Questions: 0-25% accurate Basic Immediate Environment Questions: 0-24% accurate Commands: Impaired One Step Basic Commands: 25-49% accurate Reading Comprehension Reading Status: Impaired Word level: Impaired Sentence Level: Impaired    Expression Verbal Expression Overall Verbal Expression: Impaired Initiation: Impaired Automatic Speech: Name;Counting;Singing;Day of week Level of Generative/Spontaneous Verbalization: Word;Phrase Repetition: Impaired Level of Impairment: Word level Naming: Impairment Responsive: 0-25% accurate Confrontation: Impaired Convergent: 0-24% accurate Divergent: 0-24% accurate Verbal Errors: Perseveration;Neologisms;Jargon;Phonemic paraphasias Effective Techniques: Melodic intonation;Phonemic cues;Articulatory cues Written Expression Dominant Hand: Right Written Expression:  (impaired)   Oral / Motor Oral Motor/Sensory Function Overall Oral Motor/Sensory Function: Impaired Motor Speech Overall Motor Speech: Impaired Motor Planning: Impaired Level of Impairment: Phrase Motor Speech Errors: Groping for words   GO    Marcene Duos MA, CCC-SLP Acute Care Speech Language Pathologist      Kennieth Rad 12/28/2014, 5:41 PM

## 2014-12-28 NOTE — Progress Notes (Signed)
[facility] PROGRESS NOTE  [name] ZOX:[id number] DOB: 25-Mar-1931 DOA: 12/26/2014 PCP: Donzetta Sprung, MD  Assessment/Plan: 1. Acute left MCA infarct due to intracranial occlusive disease. Patient was seen by neurology. Recommendations are to continue full dose aspirin as well as Plavix. LDL was noted to be greater than 100 and therefore she'll be started on a statin. Continue further risk factor modification. Imaging of the neck vessels indicated right carotid system with less than 50% stenosis and left carotid system with stenosis up to 65-70%. Echocardiogram has been done with report pending. 2. Expressive aphasia. Seen by speech therapy. Started on pured diet. 3. Hypertension. To restart on metoprolol. Continue to follow blood pressure. 4. Right hip pain. Patient was found on the floor at home. She has significant bruising over her right hip. Will check x-ray. 5. Hypothyroidism. Continue Synthroid 6. Chronic systolic congestive heart failure. Appears compensated. Status post AICD.  Code Status: DNR Family Communication: discussed with daughter at the bedside4 Disposition Plan: will need SNF on discharge, likely in AM   Consultants:  Neurology  Procedures:    Antibiotics:    HPI/Subjective: Complaining of pain in her right hip, worse with standing. Speech is difficult to comprehend to to aphasia  Objective: Filed Vitals:   12/28/14 0915  BP: 110/90  Pulse: 87  Temp:   Resp: 16    Intake/Output Summary (Last 24 hours) at 12/28/14 1042 Last data filed at 12/28/14 0600  Gross per 24 hour  Intake   1800 ml  Output      0 ml  Net   1800 ml   Filed Weights   12/26/14 1401 12/26/14 1916 12/27/14 0500  Weight: 70.761 kg (156 lb) 72.3 kg (159 lb 6.3 oz) 72.3 kg (159 lb 6.3 oz)    Exam:   General:  NAD  Cardiovascular: S1, s2 RRR  Respiratory: CTA B  Abdomen: soft, nt, nd, bs+  Musculoskeletal: no edema b/l, bruising and tenderness noted over  right hip  Neuro: significant expressive aphasia   Data Reviewed: Basic Metabolic Panel:  Recent Labs Lab 12/26/14 1417 12/26/14 1424  NA 137 137  K 3.6 3.7  CL 98* 100*  CO2 26  --   GLUCOSE 136* 136*  BUN 27* 29*  CREATININE 1.14* 1.10*  CALCIUM 9.5  --    Liver Function Tests:  Recent Labs Lab 12/26/14 1417  AST 60*  ALT 22  ALKPHOS 60  BILITOT 0.9  PROT 7.3  ALBUMIN 4.1   No results for input(s): LIPASE, AMYLASE in the last 168 hours. No results for input(s): AMMONIA in the last 168 hours. CBC:  Recent Labs Lab 12/26/14 1417 12/26/14 1424  WBC 14.7*  --   NEUTROABS 10.9*  --   HGB 15.1* 16.0*  HCT 44.3 47.0*  MCV 90.6  --   PLT 173  --    Cardiac Enzymes: No results for input(s): CKTOTAL, CKMB, CKMBINDEX, TROPONINI in the last 168 hours. BNP (last 3 results) No results for input(s): BNP in the last 8760 hours.  ProBNP (last 3 results) No results for input(s): PROBNP in the last 8760 hours.  CBG: No results for input(s): GLUCAP in the last 168 hours.  Recent Results (from the past 240 hour(s))  MRSA PCR Screening     Status: None   Collection Time: 12/26/14  7:11 PM  Result Value Ref Range Status   MRSA by PCR NEGATIVE NEGATIVE Final    Comment:        The  GeneXpert MRSA Assay (FDA approved for NASAL specimens only), is one component of a comprehensive MRSA colonization surveillance program. It is not intended to diagnose MRSA infection nor to guide or monitor treatment for MRSA infections.      Studies: Ct Angio Head W/cm &/or Wo Cm  12/26/2014   CLINICAL DATA:  81 year old female altered mental status, found down. Code stroke with left insula changes on noncontrast head CT. Initial encounter.  EXAM: CT ANGIOGRAPHY HEAD AND NECK  TECHNIQUE: Multidetector CT imaging of the head and neck was performed using the standard protocol during bolus administration of intravenous contrast. Multiplanar CT image reconstructions and MIPs were  obtained to evaluate the vascular anatomy. Carotid stenosis measurements (when applicable) are obtained utilizing NASCET criteria, using the distal internal carotid diameter as the denominator.  CONTRAST:  77mL OMNIPAQUE IOHEXOL 350 MG/ML SOLN  COMPARISON:  Noncontrast head CT 1409 hours today.  FINDINGS: CTA NECK  Skeleton: Absent dentition. Widespread disc, endplate, and facet degeneration in the cervical spine. Left chest cardiac pacemaker. No acute osseous abnormality identified. Mild left maxillary sinus mucosal thickening.  Other neck: Negative lung apices. No superior mediastinal lymphadenopathy.  Diminutive thyroid. Larynx, pharynx, parapharyngeal spaces, retropharyngeal space, sublingual space, submandibular glands, and parotid glands are within normal limits. Postoperative changes to the globes. Visualized scalp soft tissues are within normal limits. No cervical lymphadenopathy.  Aortic arch: 3 vessel arch configuration. Moderate mostly calcified arch atherosclerosis. No great vessel origins stenosis.  Right carotid system: Tortuous brachiocephalic artery and proximal right CCA. Normal right CCA origin. Confluent calcified plaque at the right carotid bifurcation, but less than 50 % stenosis with respect to the distal vessel results. Negative cervical right ICA otherwise.  Left carotid system: No left CCA origin stenosis. Severely tortuous proximal left CCA with a kinked appearance (sagittal image 107 of series 607), but otherwise no proximal left carotid stenosis. Confluent calcified plaque at the left carotid bifurcation. Superimposed left ICA bulb soft plaque. Stenosis up to 65-70 percent occurs at the bulb (see sagittal image 121 of series 607). Beyond the bulb the cervical left ICA is tortuous but otherwise negative.  Vertebral arteries:Tortuous proximal right subclavian artery with a kinked appearance as well as some ulcerated appearing soft plaque (sagittal image 118 of series 607). Dominant left  vertebral artery with calcified plaque at its origin and mild to moderate stenosis. Tortuous left V1 segment. Widely patent left vertebral artery otherwise to the skullbase.  No proximal right subclavian artery stenosis despite calcified plaque. Non dominant and diminutive right vertebral artery with no stenosis at its origin. The right vertebral is highly diminutive and appears to terminate in muscular branches at the C2 level, but is been reconstituted at the C1 level and remains patent into the posterior fossa.  CTA HEAD  Posterior circulation: Dominant distal left vertebral artery is patent without stenosis and primarily supplies the basilar.  Multifocal high-grade segmental stenosis of the non dominant right V4 segment. High-grade stenosis just proximal to the PICA origin but the right PICA origin is patent and is probably supplied in a retrograde fashion from the vertebrobasilar junction.  Irregular basilar artery with atherosclerosis but no stenosis. Ectatic basilar tip. SCA and PCA origins are within normal limits. Posterior communicating arteries are diminutive or absent. Bilateral PCA branches are mildly irregular and patent.  Anterior circulation: Calcified plaque in both ICA siphons. This is maximal in the supraclinoid ICA segments. There is moderate to high-grade stenosis best seen on series 605, image 95. The left  ICA terminus remains patent despite this finding. Moderate to severe stenosis also on the right (series 605, image 97). The right ICA terminus is patent.  Dominant left ACA A1 segment. Normal ACA origins. Normal anterior communicating artery. Bilateral ACA branches are within normal limits.  Right MCA origin and M1 segment is within normal limits. Right MCA bifurcation is patent. Right MCA branches are within normal limits.  Left MCA origin is patent. There is mild to moderate irregularity of the left MCA M1 segment but up to only mild stenosis (series 602, image 49). The left MCA bifurcation  is patent. The proximal M2 branches are patent. There is attenuated flow in both middle and posterior M2/M3 branches.  Venous sinuses: Patent.  Anatomic variants: Dominant left vertebral artery.  Delayed phase: Continued hypodensity in the insula and anterior left operculum, as well as asymmetric hypodensity in the subcortical white matter at the level of the corona radiata (ASPECTS score of 7). No abnormal enhancement identified. No intracranial mass effect at this time.  IMPRESSION: 1. No emergent large vessel occlusion. Hemodynamically significant stenosis in distal left M2/M3 MCA branches probably due to thromboembolic disease. 2. Upstream moderate to high-grade stenoses of the left supraclinoid ICA (calcified plaque), and left ICA bulb (65-70%, calcified and soft plaque). 3. Stable CT changes of acute cortically based left MCA infarct (ASPECTS score of 7). 4. Moderate to high-grade right supraclinoid ICA stenosis. No other hemodynamically significant right carotid stenosis. 5. Dominant and dolichoectasia attic left vertebral artery with mild to moderate origin stenosis. Non dominant right vertebral artery with tandem high-grade stenoses in the V4 segment. The right PICA is likely supplied in a retrograde fashion from the left. Basilar artery dolichoectasia. 6. Ulcerated plaque in the proximal left subclavian artery without stenosis.   Electronically Signed   By: Odessa Fleming M.D.   On: 12/26/2014 15:57   Dg Chest 2 View  12/26/2014   CLINICAL DATA:  Patient found down at 1 p.m. today. Unresponsive. Initial encounter.  EXAM: CHEST  2 VIEW  COMPARISON:  PA and lateral chest 04/23/2014.  FINDINGS: AICD is in place. The lungs are clear. There is mild cardiomegaly. No pneumothorax or pleural effusion.  IMPRESSION: No acute disease.   Electronically Signed   By: Drusilla Kanner M.D.   On: 12/26/2014 18:49   Ct Head Wo Contrast  12/26/2014   CLINICAL DATA:  Code stroke, altered mental status  EXAM: CT HEAD WITHOUT  CONTRAST  TECHNIQUE: Contiguous axial images were obtained from the base of the skull through the vertex without intravenous contrast.  COMPARISON:  12/26/2009  FINDINGS: No evidence of parenchymal hemorrhage or extra-axial fluid collection. No mass lesion, mass effect, or midline shift.  Hypodensity in the left insular cortex (series 2/image 16), suspicious for acute left MCA distribution infarct. Less than 1/3 of the left MCA territory is involved.  Subcortical white matter and periventricular small vessel ischemic changes. Intracranial atherosclerosis.  Mild cortical atrophy.  No ventriculomegaly.  The visualized paranasal sinuses are essentially clear. The mastoid air cells are unopacified.  No evidence of calvarial fracture.  IMPRESSION: Suspected acute left MCA distribution infarct, as above.  Critical Value/emergent results were called by telephone at the time of interpretation on 12/26/2014 at 2:20 pm to Dr. Donnetta Hutching, who verbally acknowledged these results.   Electronically Signed   By: Charline Bills M.D.   On: 12/26/2014 14:23   Ct Angio Neck W/cm &/or Wo/cm  12/26/2014   CLINICAL DATA:  81 year old female altered  mental status, found down. Code stroke with left insula changes on noncontrast head CT. Initial encounter.  EXAM: CT ANGIOGRAPHY HEAD AND NECK  TECHNIQUE: Multidetector CT imaging of the head and neck was performed using the standard protocol during bolus administration of intravenous contrast. Multiplanar CT image reconstructions and MIPs were obtained to evaluate the vascular anatomy. Carotid stenosis measurements (when applicable) are obtained utilizing NASCET criteria, using the distal internal carotid diameter as the denominator.  CONTRAST:  75mL OMNIPAQUE IOHEXOL 350 MG/ML SOLN  COMPARISON:  Noncontrast head CT 1409 hours today.  FINDINGS: CTA NECK  Skeleton: Absent dentition. Widespread disc, endplate, and facet degeneration in the cervical spine. Left chest cardiac pacemaker. No  acute osseous abnormality identified. Mild left maxillary sinus mucosal thickening.  Other neck: Negative lung apices. No superior mediastinal lymphadenopathy.  Diminutive thyroid. Larynx, pharynx, parapharyngeal spaces, retropharyngeal space, sublingual space, submandibular glands, and parotid glands are within normal limits. Postoperative changes to the globes. Visualized scalp soft tissues are within normal limits. No cervical lymphadenopathy.  Aortic arch: 3 vessel arch configuration. Moderate mostly calcified arch atherosclerosis. No great vessel origins stenosis.  Right carotid system: Tortuous brachiocephalic artery and proximal right CCA. Normal right CCA origin. Confluent calcified plaque at the right carotid bifurcation, but less than 50 % stenosis with respect to the distal vessel results. Negative cervical right ICA otherwise.  Left carotid system: No left CCA origin stenosis. Severely tortuous proximal left CCA with a kinked appearance (sagittal image 107 of series 607), but otherwise no proximal left carotid stenosis. Confluent calcified plaque at the left carotid bifurcation. Superimposed left ICA bulb soft plaque. Stenosis up to 65-70 percent occurs at the bulb (see sagittal image 121 of series 607). Beyond the bulb the cervical left ICA is tortuous but otherwise negative.  Vertebral arteries:Tortuous proximal right subclavian artery with a kinked appearance as well as some ulcerated appearing soft plaque (sagittal image 118 of series 607). Dominant left vertebral artery with calcified plaque at its origin and mild to moderate stenosis. Tortuous left V1 segment. Widely patent left vertebral artery otherwise to the skullbase.  No proximal right subclavian artery stenosis despite calcified plaque. Non dominant and diminutive right vertebral artery with no stenosis at its origin. The right vertebral is highly diminutive and appears to terminate in muscular branches at the C2 level, but is been  reconstituted at the C1 level and remains patent into the posterior fossa.  CTA HEAD  Posterior circulation: Dominant distal left vertebral artery is patent without stenosis and primarily supplies the basilar.  Multifocal high-grade segmental stenosis of the non dominant right V4 segment. High-grade stenosis just proximal to the PICA origin but the right PICA origin is patent and is probably supplied in a retrograde fashion from the vertebrobasilar junction.  Irregular basilar artery with atherosclerosis but no stenosis. Ectatic basilar tip. SCA and PCA origins are within normal limits. Posterior communicating arteries are diminutive or absent. Bilateral PCA branches are mildly irregular and patent.  Anterior circulation: Calcified plaque in both ICA siphons. This is maximal in the supraclinoid ICA segments. There is moderate to high-grade stenosis best seen on series 605, image 95. The left ICA terminus remains patent despite this finding. Moderate to severe stenosis also on the right (series 605, image 97). The right ICA terminus is patent.  Dominant left ACA A1 segment. Normal ACA origins. Normal anterior communicating artery. Bilateral ACA branches are within normal limits.  Right MCA origin and M1 segment is within normal limits. Right MCA bifurcation  is patent. Right MCA branches are within normal limits.  Left MCA origin is patent. There is mild to moderate irregularity of the left MCA M1 segment but up to only mild stenosis (series 602, image 49). The left MCA bifurcation is patent. The proximal M2 branches are patent. There is attenuated flow in both middle and posterior M2/M3 branches.  Venous sinuses: Patent.  Anatomic variants: Dominant left vertebral artery.  Delayed phase: Continued hypodensity in the insula and anterior left operculum, as well as asymmetric hypodensity in the subcortical white matter at the level of the corona radiata (ASPECTS score of 7). No abnormal enhancement identified. No  intracranial mass effect at this time.  IMPRESSION: 1. No emergent large vessel occlusion. Hemodynamically significant stenosis in distal left M2/M3 MCA branches probably due to thromboembolic disease. 2. Upstream moderate to high-grade stenoses of the left supraclinoid ICA (calcified plaque), and left ICA bulb (65-70%, calcified and soft plaque). 3. Stable CT changes of acute cortically based left MCA infarct (ASPECTS score of 7). 4. Moderate to high-grade right supraclinoid ICA stenosis. No other hemodynamically significant right carotid stenosis. 5. Dominant and dolichoectasia attic left vertebral artery with mild to moderate origin stenosis. Non dominant right vertebral artery with tandem high-grade stenoses in the V4 segment. The right PICA is likely supplied in a retrograde fashion from the left. Basilar artery dolichoectasia. 6. Ulcerated plaque in the proximal left subclavian artery without stenosis.   Electronically Signed   By: Odessa Fleming M.D.   On: 12/26/2014 15:57   US Carotid Bilateral  12/27/2014   CLINICAL DATA:  Cerebrovascular accident.  EXAM: BILATERAL CAROTID DUPLEX ULTRASOUND  TECHNIQUE: Wallace Cullens scale imaging, color Doppler and duplex ultrasound were performed of bilateral carotid and vertebral arteries in the neck.  COMPARISON:  None.  FINDINGS: Criteria: Quantification of carotid stenosis is based on velocity parameters that correlate the residual internal carotid diameter with NASCET-based stenosis levels, using the diameter of the distal internal carotid lumen as the denominator for stenosis measurement.  The following velocity measurements were obtained:  RIGHT  ICA:  72/19 cm/sec  CCA:  55/13 cm/sec  SYSTOLIC ICA/CCA RATIO:  1.3  DIASTOLIC ICA/CCA RATIO:  1.5  ECA:  183 cm/sec  LEFT  ICA:  117/33 cm/sec  CCA:  59/14 cm/sec  SYSTOLIC ICA/CCA RATIO:  2.0  DIASTOLIC ICA/CCA RATIO:  2.4  ECA:  103 cm/sec  RIGHT CAROTID ARTERY: Moderate irregular, eccentric and partially calcified plaque is noted  in the right carotid bulb and proximal right internal carotid artery. By ultrasound imaging it appears to be greater than 50% diameter stenosis, although velocities are not significantly elevated.  RIGHT VERTEBRAL ARTERY:  Not visualized.  LEFT CAROTID ARTERY: Calcified, irregular and eccentric plaque is noted in the proximal left internal carotid artery which appears to be less than 50% diameter stenosis based on ultrasound and Doppler criteria.  LEFT VERTEBRAL ARTERY:  Antegrade flow is noted.  IMPRESSION: Calcified and irregular plaque formation is noted in the proximal left internal carotid artery consistent with less than 50% diameter stenosis based on ultrasound and Doppler criteria.  Moderate irregular and partially calcified plaque formation is noted in the right carotid bulb and proximal right internal carotid artery which appears to be greater than 50% diameter stenosis based on ultrasound imaging, but velocities are not significantly elevated. CT angiography is recommended for further evaluation.   Electronically Signed   By: Lupita Raider, M.D.   On: 12/27/2014 13:13    Scheduled Meds: . aspirin  300 mg Rectal Daily   Or  . aspirin  325 mg Oral Daily  . atorvastatin  10 mg Oral q1800  . clopidogrel  75 mg Oral QHS  . heparin  5,000 Units Subcutaneous 3 times per day  . levothyroxine  112 mcg Oral Daily  . metoprolol succinate  50 mg Oral Daily  . [START ON 12/29/2014] pneumococcal 23 valent vaccine  0.5 mL Intramuscular Tomorrow-1000  . sertraline  50 mg Oral QHS   Continuous Infusions:   Active Problems:   Essential hypertension, benign   Automatic implantable cardioverter-defibrillator in situ   Chronic systolic heart failure   CVA (cerebral infarction)    Time spent:    Jabre Heo  [facility] Pager [phone]. If 7PM-7AM, please contact night-coverage at [email or web address], password [facility] 12/28/2014, 10:42 AM  LOS: 2 days

## 2014-12-28 NOTE — Progress Notes (Signed)
Speech Language Pathology Treatment: Dysphagia  Patient Details Name: [name] MRN: [id number] DOB: [birth year] Today's Date: 12/28/2014 Time: 6606-0045 SLP Time Calculation (min) (ACUTE ONLY): 12 min  Assessment / Plan / Recommendation Clinical Impression  Pt seen with puree and thin liquid dinner tray. Pt able to self feed with stand by assistance. Pt consumed thin liqud, puree and upgraded trials of mechanical soft consistencies without signs or symptoms of aspiration. Pt does however display decreased safety awareness, consuming soup before checking temperature which appeared to be too hot. Mild prologned mastication of mech soft consistencies noted along with mild oral residue. Recommend diet upgrade to dysphagia 2: chopped consistency. Continue thin liquids and medicines administered whole with thin liquid. Anticipate continued diet tolerance and diet upgrade. ST will follow up for safety and continued trials of upgraded consistencies.    HPI Other Pertinent Information: [name] is an 81 y.o. female with hx of hypothyroidism, HLD, HTN, depression, CMP s/p implantable defibrillator, spinal stenosis, admitted for a large Left MCA territory CVA, with echolalia, expressive aphasia, but no significant right hemiplegia. She cannot swallow, however. She has been on DUAT (plavix and ASA) in the past, and had several cardiac stent placement before. She was out of the window for TPA or mechanical thrombus extraction. CTA of the cerebral vessel showed significant stenosis, but non large vessel stenosis.    Pertinent Vitals Pain Assessment: No/denies pain  SLP Plan  Continue with current plan of care    Recommendations Diet recommendations: Dysphagia 2 (fine chop);Thin liquid Liquids provided via: Straw;Cup Medication Administration: Whole meds with liquid Supervision: Patient able to self feed;Staff to assist with self feeding;Full supervision/cueing for compensatory  strategies Compensations: Slow rate;Small sips/bites Postural Changes and/or Swallow Maneuvers: Seated upright 90 degrees              Oral Care Recommendations: Oral care BID Plan: Continue with current plan of care    GO    Marcene Duos MA, CCC-SLP Acute Care Speech Language Pathologist    Kennieth Rad 12/28/2014, 5:46 PM

## 2014-12-28 NOTE — Clinical Social Work Placement (Signed)
   CLINICAL SOCIAL WORK PLACEMENT  NOTE  Date:  12/28/2014  Patient Details  Name: [name] MRN: [id number] Date of Birth: 06-Feb-1931  Clinical Social Work is seeking post-discharge placement for this patient at the Skilled  Nursing Facility level of care (*CSW will initial, date and re-position this form in  chart as items are completed):  Yes   Patient/family provided with [place] Clinical Social Work Department's list of facilities offering this level of care within the geographic area requested by the patient (or if unable, by the patient's family).  Yes   Patient/family informed of their freedom to choose among providers that offer the needed level of care, that participate in Medicare, Medicaid or managed care program needed by the patient, have an available bed and are willing to accept the patient.  Yes   Patient/family informed of [facility]'s ownership interest in [facility] and [facility], as well as of the fact that they are under no obligation to receive care at these facilities.  PASRR submitted to EDS on 12/27/14     PASRR number received on 12/27/14     Existing PASRR number confirmed on       FL2 transmitted to all facilities in geographic area requested by pt/family on 12/28/14     FL2 transmitted to all facilities within larger geographic area on       Patient informed that his/her managed care company has contracts with or will negotiate with certain facilities, including the following:            Patient/family informed of bed offers received.  Patient chooses bed at       Physician recommends and patient chooses bed at      Patient to be transferred to   on  .  Patient to be transferred to facility by       Patient family notified on   of transfer.  Name of family member notified:        PHYSICIAN       Additional Comment:    _______________________________________________ Karn Cassis, LCSW 12/28/2014, 8:27  AM [phone]

## 2014-12-28 NOTE — Progress Notes (Signed)
Patient ID: [name], female   DOB: [birth year], 81 y.o.   MRN: [id number]  [facility] NEUROLOGY Krystie Leiter A. Gerilyn Pilgrim, MD     [email or web address]          [name] is an 81 y.o. female.   Assessment/Plan: 1. Acute left MCA infarct due to intracranial occlusive disease. Risk factors hypertension, diabetes, dyslipidemia, age and coronary artery disease. 2. Multiple intracranial occlusive disease.  Recommendation: Increase aspirin to a full strength 325 mg. This is to be combined with Plavix for at least 3 months. Subsequently, she can be placed on a single antiplatelet agent preferably aspirin 325. However, she is on dual antiplatelet agents because of coronary disease and stents and suspect that she'll likely need to be on this combination for more extensive time period. Speech and occupational therapy along with physical therapy. Statin medication. Continuous factor modification including blood pressure and diabetes control.   Speech improved per family.   GENERAL: This is a pleasant female in no acute distress.  HEENT: Supple. Atraumatic normocephalic.   ABDOMEN: soft  EXTREMITIES: No edema   BACK: Normal.  SKIN: Normal by inspection.   MENTAL STATUS: Patient is awake and alert. She has a rather severe aphasia both receptive and expressive about equally impaired but better fluency as compared to yesyerday. Naming still severely impaired 0/3 objects with with inability to find words. She may follow midline commands but this is not consistent. She does not follow appendicular commands. She infrequently states one or 2 word sentences.  CRANIAL NERVES: Pupils are equal, round and reactive to light; extra ocular movements are full, there is no significant nystagmus; visual fields are very limited but she actually seems to have impaired peripheral vision bilaterally; upper and lower facial muscles are normal in strength and symmetric, there is no flattening of the  nasolabial folds; tongue is midline; uvula is midline.  MOTOR: Unreliable but she appears to have at least 4+/5 in in the lower extremities and a 4 in the upper extremities.  COORDINATION: No dysmetria observed. No tremors. No parkinsonism.  REFLEXES: Deep tendon reflexes are symmetrical and normal. Babinski reflexes are flexor bilaterally.   SENSATION: Responds to painful some mild bilaterally.     Objective: Vital signs in last 24 hours: Temp:  [97.6 F (36.4 C)-98.4 F (36.9 C)] 98.2 F (36.8 C) (06/15 1721) Pulse Rate:  [68-97] 79 (06/15 1721) Resp:  [16-30] 18 (06/15 1721) BP: (104-178)/(56-133) 168/80 mmHg (06/15 1721) SpO2:  [61 %-100 %] 95 % (06/15 1721)  Intake/Output from previous day: 06/14 0701 - 06/15 0700 In: 1800 [I.V.:1800] Out: -  Intake/Output this shift:   Nutritional status: DIET - DYS 1 Room service appropriate?: Yes; Fluid consistency:: Thin   Lab Results: Results for orders placed or performed during the hospital encounter of 12/26/14 (from the past 48 hour(s))  MRSA PCR Screening     Status: None   Collection Time: 12/26/14  7:11 PM  Result Value Ref Range   MRSA by PCR NEGATIVE NEGATIVE    Comment:        The GeneXpert MRSA Assay (FDA approved for NASAL specimens only), is one component of a comprehensive MRSA colonization surveillance program. It is not intended to diagnose MRSA infection nor to guide or monitor treatment for MRSA infections.   Lipid panel     Status: Abnormal   Collection Time: 12/27/14  5:43 AM  Result Value Ref Range   Cholesterol 195 0 - 200 mg/dL   Triglycerides 165 (  H) <150 mg/dL   HDL 43 >40 mg/dL   Total CHOL/HDL Ratio 4.5 RATIO   VLDL 34 0 - 40 mg/dL   LDL Cholesterol 981 (H) 0 - 99 mg/dL    Comment:        Total Cholesterol/HDL:CHD Risk Coronary Heart Disease Risk Table                     Men   Women  1/2 Average Risk   3.4   3.3  Average Risk       5.0   4.4  2 X Average Risk   9.6   7.1  3 X  Average Risk  23.4   11.0        Use the calculated Patient Ratio above and the CHD Risk Table to determine the patient's CHD Risk.        ATP III CLASSIFICATION (LDL):  <100     mg/dL   Optimal  191-478  mg/dL   Near or Above                    Optimal  130-159  mg/dL   Borderline  295-621  mg/dL   High  >308     mg/dL   Very High   Antithrombin III     Status: None   Collection Time: 12/27/14 12:20 PM  Result Value Ref Range   AntiThromb III Func 103 75 - 120 %    Comment: Performed at [facility]  Protein C, total     Status: None   Collection Time: 12/27/14 12:20 PM  Result Value Ref Range   Protein C, Total 77 70 - 140 %    Comment: (NOTE) Performed At: [facility] [address] [place], [place] [id number] Mila Homer MD [phone]   TSH     Status: Abnormal   Collection Time: 12/27/14 12:20 PM  Result Value Ref Range   TSH 0.300 (L) 0.350 - 4.500 uIU/mL  Urine rapid drug screen (hosp performed)not at [facility]     Status: None   Collection Time: 12/27/14  9:05 PM  Result Value Ref Range   Opiates NONE DETECTED NONE DETECTED   Cocaine NONE DETECTED NONE DETECTED   Benzodiazepines NONE DETECTED NONE DETECTED   Amphetamines NONE DETECTED NONE DETECTED   Tetrahydrocannabinol NONE DETECTED NONE DETECTED   Barbiturates NONE DETECTED NONE DETECTED    Comment:        DRUG SCREEN FOR MEDICAL PURPOSES ONLY.  IF CONFIRMATION IS NEEDED FOR ANY PURPOSE, NOTIFY LAB WITHIN 5 DAYS.        LOWEST DETECTABLE LIMITS FOR URINE DRUG SCREEN Drug Class       Cutoff (ng/mL) Amphetamine      1000 Barbiturate      200 Benzodiazepine   200 Tricyclics       300 Opiates          300 Cocaine          300 THC              50   Urinalysis, Routine w reflex microscopic (not at [facility])     Status: Abnormal   Collection Time: 12/27/14  9:05 PM  Result Value Ref Range   Color, Urine YELLOW YELLOW   APPearance HAZY (A) CLEAR   Specific Gravity, Urine >1.030 (H)  1.005 - 1.030   pH 5.5 5.0 - 8.0   Glucose, UA NEGATIVE NEGATIVE mg/dL  Hgb urine dipstick SMALL (A) NEGATIVE   Bilirubin Urine NEGATIVE NEGATIVE   Ketones, ur NEGATIVE NEGATIVE mg/dL   Protein, ur 30 (A) NEGATIVE mg/dL   Urobilinogen, UA 0.2 0.0 - 1.0 mg/dL   Nitrite POSITIVE (A) NEGATIVE   Leukocytes, UA NEGATIVE NEGATIVE  Urine microscopic-add on     Status: Abnormal   Collection Time: 12/27/14  9:05 PM  Result Value Ref Range   Squamous Epithelial / LPF MANY (A) RARE   WBC, UA 7-10 <3 WBC/hpf   RBC / HPF 3-6 <3 RBC/hpf   Bacteria, UA MANY (A) RARE  Lipid panel     Status: Abnormal   Collection Time: 12/28/14  4:54 AM  Result Value Ref Range   Cholesterol 207 (H) 0 - 200 mg/dL   Triglycerides 161 (H) <150 mg/dL   HDL 41 >09 mg/dL   Total CHOL/HDL Ratio 5.0 RATIO   VLDL 45 (H) 0 - 40 mg/dL   LDL Cholesterol 604 (H) 0 - 99 mg/dL    Comment:        Total Cholesterol/HDL:CHD Risk Coronary Heart Disease Risk Table                     Men   Women  1/2 Average Risk   3.4   3.3  Average Risk       5.0   4.4  2 X Average Risk   9.6   7.1  3 X Average Risk  23.4   11.0        Use the calculated Patient Ratio above and the CHD Risk Table to determine the patient's CHD Risk.        ATP III CLASSIFICATION (LDL):  <100     mg/dL   Optimal  540-981  mg/dL   Near or Above                    Optimal  130-159  mg/dL   Borderline  191-478  mg/dL   High  >295     mg/dL   Very High     Lipid Panel  Recent Labs  12/28/14 0454  CHOL 207*  TRIG 225*  HDL 41  CHOLHDL 5.0  VLDL 45*  LDLCALC 121*    Studies/Results:   Medications:  Scheduled Meds: . aspirin  300 mg Rectal Daily   Or  . aspirin  325 mg Oral Daily  . atorvastatin  10 mg Oral q1800  . clopidogrel  75 mg Oral QHS  . heparin  5,000 Units Subcutaneous 3 times per day  . levothyroxine  112 mcg Oral QAC breakfast  . metoprolol succinate  50 mg Oral Daily  . [START ON 12/29/2014] pneumococcal 23 valent  vaccine  0.5 mL Intramuscular Tomorrow-1000  . sertraline  50 mg Oral QHS   Continuous Infusions:  PRN Meds:.acetaminophen, hydrALAZINE, HYDROcodone-acetaminophen     LOS: 2 days   Abeeha Twist A. Gerilyn Pilgrim, M.D.  Diplomate, Biomedical engineer of Psychiatry and Neurology ( Neurology).

## 2014-12-28 NOTE — Progress Notes (Signed)
Report called to M.Delford Field, RN. Pt transferred to room 306 via wheelchair. Family accompanied at bedside.

## 2014-12-29 ENCOUNTER — Inpatient Hospital Stay
Admission: RE | Admit: 2014-12-29 | Discharge: 2015-02-18 | Disposition: A | Discharge status: Home / Self Care | Payer: Federal, State, Local not specified - PPO | Source: Ambulatory Visit | Attending: Internal Medicine | Admitting: Internal Medicine

## 2014-12-29 DIAGNOSIS — I63512 Cerebral infarction due to unspecified occlusion or stenosis of left middle cerebral artery: Principal | ICD-10-CM

## 2014-12-29 DIAGNOSIS — R4701 Aphasia: Secondary | ICD-10-CM

## 2014-12-29 DIAGNOSIS — M79601 Pain in right arm: Principal | ICD-10-CM

## 2014-12-29 DIAGNOSIS — R52 Pain, unspecified: Secondary | ICD-10-CM

## 2014-12-29 DIAGNOSIS — Z95810 Presence of automatic (implantable) cardiac defibrillator: Secondary | ICD-10-CM

## 2014-12-29 LAB — BASIC METABOLIC PANEL
ANION GAP: 9 (ref 5–15)
BUN: 12 mg/dL (ref 6–20)
CO2: 31 mmol/L (ref 22–32)
Calcium: 8.9 mg/dL (ref 8.9–10.3)
Chloride: 98 mmol/L — ABNORMAL LOW (ref 101–111)
Creatinine, Ser: 0.69 mg/dL (ref 0.44–1.00)
GFR calc Af Amer: >60 mL/min (ref >60)
GFR calc non Af Amer: >60 mL/min (ref >60)
Glucose, Bld: 115 mg/dL — ABNORMAL HIGH (ref 65–99)
Potassium: 3.1 mmol/L — ABNORMAL LOW (ref 3.5–5.1)
Sodium: 138 mmol/L (ref 135–145)

## 2014-12-29 LAB — CBC
HCT: 43.5 % (ref 36.0–46.0)
Hemoglobin: 14.7 g/dL (ref 12.0–15.0)
MCH: 30.7 pg (ref 26.0–34.0)
MCHC: 33.8 g/dL (ref 30.0–36.0)
MCV: 90.8 fL (ref 78.0–100.0)
PLATELETS: 180 10*3/uL (ref 150–400)
RBC: 4.79 MIL/uL (ref 3.87–5.11)
RDW: 13.1 % (ref 11.5–15.5)
WBC: 8.3 10*3/uL (ref 4.0–10.5)

## 2014-12-29 LAB — CARDIOLIPIN ANTIBODIES, IGG, IGM, IGA
Anticardiolipin IgA: <9 APL U/mL (ref 0–11)
Anticardiolipin IgM: <9 MPL U/mL (ref 0–12)

## 2014-12-29 MED ORDER — ASPIRIN 325 MG PO TABS: 325.0000 mg | ORAL_TABLET | Freq: Every day | ORAL | Status: DC

## 2014-12-29 MED ORDER — POTASSIUM CHLORIDE 10 MEQ/100ML IV SOLN: 10.0000 meq | INTRAVENOUS | Status: DC

## 2014-12-29 MED ORDER — POTASSIUM CHLORIDE 20 MEQ PO PACK
40.0000 meq | PACK | Freq: Once | ORAL | Status: AC
  Administered 2014-12-29: 40 meq via ORAL
  Filled 2014-12-29: qty 2

## 2014-12-29 MED ORDER — ATORVASTATIN CALCIUM 10 MG PO TABS: 10.0000 mg | ORAL_TABLET | Freq: Every day | ORAL | Status: DC

## 2014-12-29 NOTE — Progress Notes (Signed)
Occupational Therapy Treatment Patient Details Name: [name] MRN: [id number] DOB: May 07, 1931 Today's Date: 12/29/2014    History of present illness Pt is an 81 year old lady who apparently was last in her usual state yesterday evening. Today, at approximately 1 PM she was found in her home on the floor. She was disorientated and almost unresponsive. The patient has a history of hypertension, cardiomyopathy, coronary artery disease, status post implanted defibrillator. The patient herself cannot give me any clear history. Evaluation in the emergency room with a CT scan shows CVA, fairly sizable, affecting the left MCA distribution. She appears to have hemodynamically significant stenosis in the distal left MCA branches probably due to thromboembolic disease. She is now being admitted for further management.   OT comments  Pt awake and alert this am. Pt appears to be in less pain this am than previous session. Pt required supervision for bed mobility, min assist for sit->stand. Pt able to transfer to chair using walker for stability, min guard assist. Pt donned bedroom slippers with min assist from OTR. Pt required set-up for grooming tasks, pt unable to use right hand for washing face due to bandage. Pt discharge plan remains appropriate. Pt is discharging to [facility] today.   Follow Up Recommendations  SNF    Equipment Recommendations  Tub/shower seat       Precautions / Restrictions Precautions Precautions: Fall Restrictions Weight Bearing Restrictions: No       Mobility Bed Mobility Overal bed mobility: Needs Assistance Bed Mobility: Supine to Sit     Supine to sit: Supervision        Transfers Overall transfer level: Needs assistance Equipment used: Rolling walker (2 wheeled) Transfers: Sit to/from Stand Sit to Stand: Min assist         General transfer comment: verbal cuing to push up from bed        ADL Overall ADL's : Needs assistance/impaired      Grooming: Set up               Lower Body Dressing: Minimal assistance;Sitting/lateral leans               Functional mobility during ADLs: Min guard;Rolling walker;Cueing for sequencing                  Cognition   Behavior During Therapy: [facility] for tasks assessed/performed Overall Cognitive Status: Within Functional Limits for tasks assessed                                    Pertinent Vitals/ Pain       Pain Assessment: Faces Faces Pain Scale: Hurts a little bit Pain Location: right hip/abdomen area Pain Descriptors / Indicators: Grimacing Pain Intervention(s): Limited activity within patient's tolerance;Monitored during session         Frequency Min 2X/week     Progress Toward Goals  OT Goals(current goals can now be found in the care plan section)  Progress towards OT goals: Progressing toward goals  ADL Goals Pt Will Perform Grooming: with set-up;sitting Pt Will Perform Lower Body Dressing: with supervision;sitting/lateral leans Pt Will Transfer to Toilet: with min assist;regular height toilet Additional ADL Goal #1: Pt will increase RUE strength to 4/5 to increase ability to use RUE as dominant  extremity.   Plan Discharge plan remains appropriate       End of Session Equipment Utilized During Treatment: Gait belt;Rolling walker  Activity Tolerance Patient tolerated treatment well   Patient Left in chair;with call bell/phone within reach;with family/visitor present           Time: 0927-1001 OT Time Calculation (min): 34 min  Charges: OT General Charges $OT Visit: 1 Procedure OT Treatments $Self Care/Home Management : 23-37 mins  Ezra Sites, OTR/L  [phone]  12/29/2014, 11:25 AM

## 2014-12-29 NOTE — Progress Notes (Signed)
Patient has no IV access. Dr. Kerry Hough aware.

## 2014-12-29 NOTE — Clinical Social Work Placement (Signed)
   CLINICAL SOCIAL WORK PLACEMENT  NOTE  Date:  12/29/2014  Patient Details  Name: [name] MRN: [id number] Date of Birth: 02-Sep-1930  Clinical Social Work is seeking post-discharge placement for this patient at the Skilled  Nursing Facility level of care (*CSW will initial, date and re-position this form in  chart as items are completed):  Yes   Patient/family provided with [place] Clinical Social Work Department's list of facilities offering this level of care within the geographic area requested by the patient (or if unable, by the patient's family).  Yes   Patient/family informed of their freedom to choose among providers that offer the needed level of care, that participate in Medicare, Medicaid or managed care program needed by the patient, have an available bed and are willing to accept the patient.  Yes   Patient/family informed of [facility]'s ownership interest in [facility] and [facility], as well as of the fact that they are under no obligation to receive care at these facilities.  PASRR submitted to EDS on 12/27/14     PASRR number received on 12/27/14     Existing PASRR number confirmed on       FL2 transmitted to all facilities in geographic area requested by pt/family on 12/28/14     FL2 transmitted to all facilities within larger geographic area on       Patient informed that his/her managed care company has contracts with or will negotiate with certain facilities, including the following:        Yes   Patient/family informed of bed offers received.  Patient chooses bed at [facility]     Physician recommends and patient chooses bed at      Patient to be transferred to [facility] on 12/29/14.  Patient to be transferred to facility by staff     Patient family notified on 12/29/14 of transfer.  Name of family member notified:  [name]- daughter     PHYSICIAN       Additional Comment:     _______________________________________________ Karn Cassis, LCSW 12/29/2014, 10:00 AM [phone]

## 2014-12-29 NOTE — Progress Notes (Signed)
Patient with orders to be discharge to [facility]. Report called to [facility], nurse. Discharge packet sent with patient. Patient stable. Patient transported via staff.

## 2014-12-29 NOTE — Care Management Note (Signed)
Case Management Note  Patient Details  Name: MAXWELL ODER MRN: [id number] Date of Birth: July 14, 1931  Subjective/Objective:                    Action/Plan:   Expected Discharge Date:                  Expected Discharge Plan:  Skilled Nursing Facility  In-House Referral:  Clinical Social Work  Discharge planning Services  NA  Post Acute Care Choice:  NA Choice offered to:  NA  DME Arranged:    DME Agency:     HH Arranged:    HH Agency:     Status of Service:  Completed, signed off  Medicare Important Message Given:  Yes Date Medicare IM Given:  12/29/14 Medicare IM give by:  Arlyss Queen, RN BSN CM Date Additional Medicare IM Given:    Additional Medicare Important Message give by:     If discussed at Long Length of Stay Meetings, dates discussed:    Additional Comments: Pt discharged to [facility] center today. CSW to arrange discharge to facility. Arlyss Queen [place], RN 12/29/2014, 10:10 AM

## 2014-12-29 NOTE — Discharge Summary (Signed)
Physician Discharge Summary  [name] ELT:[id number] DOB: 09-21-1930 DOA: 12/26/2014  PCP: Donzetta Sprung, MD  Admit date: 12/26/2014 Discharge date: 12/29/2014  Time spent: 40 minutes  Recommendations for Outpatient Follow-up:  1. Patient be discharged to skilled nursing facility 2. Primary care physician a 1-2 weeks  Discharge Diagnoses:  Principal Problem:   Acute ischemic left MCA stroke Active Problems:   Essential hypertension, benign   Automatic implantable cardioverter-defibrillator in situ   Chronic systolic heart failure   Mixed hyperlipidemia   Hypothyroidism   Expressive aphasia   Discharge Condition: improving  Diet recommendation: low salt, dysphagia 2 diet with thin liquids  Filed Weights   12/26/14 1401 12/26/14 1916 12/27/14 0500  Weight: 70.761 kg (156 lb) 72.3 kg (159 lb 6.3 oz) 72.3 kg (159 lb 6.3 oz)    History of present illness:  This patient was admitted to the hospital with altered mental status. She was disoriented and almost unresponsive. She noted to have an expressive aphasia. CT scan of the emergency room showed left MCA infarct. She was admitted for further treatments.  Hospital Course:  Patient was monitored in the stepdown unit. She underwent CT angiogram of the head and neck that showed acute left MCA infarct. Intracranial occlusive disease. Imaging of the neck showed less than 50% stenosis in the right carotid system and 65-70% stenosis in the left carotid system. This can be further followed up in the outpatient setting. Echocardiogram was done and found to be unrevealing. She was seen by neurology and recommendations were to increase aspirin to full dose, 325 mg as well as continuing Plavix for at least 3 months. The patient was seen by speech therapy and occupational therapy as well as physical therapy recommended skilled nursing facility placement. LDL was noted to be greater than 100 she was started on Lipitor. She is continued on her  blood pressure medications and blood pressure appears to be stable at this time. She discussed this can be further managed as an outpatient. Clinically, the patient appears to be improving. She appears to be stable for discharge to skilled nursing facility today.   Procedures:    Consultations:  Neurology  Discharge Exam: Filed Vitals:   12/29/14 0623  BP: 144/72  Pulse: 83  Temp: 98.5 F (36.9 C)  Resp: 18    General: NAD Cardiovascular: S1, S2 RRR Respiratory: CTA B  Discharge Instructions   Discharge Instructions    Diet - low sodium heart healthy    Complete by:  As directed      Increase activity slowly    Complete by:  As directed           Current Discharge Medication List    START taking these medications   Details  atorvastatin (LIPITOR) 10 MG tablet Take 1 tablet (10 mg total) by mouth daily at 6 PM.      CONTINUE these medications which have CHANGED   Details  aspirin 325 MG tablet Take 1 tablet (325 mg total) by mouth daily.      CONTINUE these medications which have NOT CHANGED   Details  acetaminophen (TYLENOL) 650 MG CR tablet Take 650 mg by mouth every 8 (eight) hours as needed for pain.     Calcium Carbonate-Vitamin D (CALTRATE 600+D) 600-400 MG-UNIT per tablet Take 1 tablet by mouth daily.     chlorthalidone (HYGROTON) 25 MG tablet Take 25 mg by mouth daily.     Cholecalciferol (VITAMIN D) 2000 UNITS CAPS Take 2,000 Units  by mouth daily.    clopidogrel (PLAVIX) 75 MG tablet Take 75 mg by mouth at bedtime.     eplerenone (INSPRA) 25 MG tablet Take 25 mg by mouth at bedtime.     ezetimibe (ZETIA) 10 MG tablet Take 10 mg by mouth daily.    levothyroxine (SYNTHROID) 112 MCG tablet Take 112 mcg by mouth daily.      loratadine (CLARITIN) 10 MG tablet Take 10 mg by mouth daily as needed for allergies.    losartan (COZAAR) 100 MG tablet Take 100 mg by mouth daily.   Associated Diagnoses: Chronic systolic heart failure    metoprolol  succinate (TOPROL-XL) 50 MG 24 hr tablet Take 1 tablet (50 mg total) by mouth daily. Take with or immediately following a meal. Qty: 30 tablet, Refills: 6    sertraline (ZOLOFT) 100 MG tablet Take 1/2 tab ( ) at bedtime     vitamin B-12 (CYANOCOBALAMIN) 1000 MCG tablet Take 1,000 mcg by mouth daily.       STOP taking these medications     cloNIDine (CATAPRES) 0.1 MG tablet      estradiol (ESTRACE) 1 MG tablet        Allergies  Allergen Reactions  . Nsaids Other (See Comments)    Vomited blood   . Oxycodone Hcl Hives and Rash  . Povidone-Iodine Rash  . Prednisone Nausea Only and Other (See Comments)    FLU SYMPTOMS  . Amoxicillin-Pot Clavulanate Other (See Comments)    VOMITING  . Doxazosin Mesylate Other (See Comments)    INCONTINENCE   . Betadine [Povidone Iodine]     rash  . Cephalexin Rash  . Cymbalta [Duloxetine Hcl] Nausea Only and Other (See Comments)    Headache  . Gabapentin Rash  . Neomycin Rash  . Tape Other (See Comments)    Redness      The results of significant diagnostics from this hospitalization (including imaging, microbiology, ancillary and laboratory) are listed below for reference.    Significant Diagnostic Studies: Ct Angio Head W/cm &/or Wo Cm  12/26/2014   CLINICAL DATA:  81 year old female altered mental status, found down. Code stroke with left insula changes on noncontrast head CT. Initial encounter.  EXAM: CT ANGIOGRAPHY HEAD AND NECK  TECHNIQUE: Multidetector CT imaging of the head and neck was performed using the standard protocol during bolus administration of intravenous contrast. Multiplanar CT image reconstructions and MIPs were obtained to evaluate the vascular anatomy. Carotid stenosis measurements (when applicable) are obtained utilizing NASCET criteria, using the distal internal carotid diameter as the denominator.  CONTRAST:  75mL OMNIPAQUE IOHEXOL 350 MG/ML SOLN  COMPARISON:  Noncontrast head CT 1409 hours today.  FINDINGS: CTA  NECK  Skeleton: Absent dentition. Widespread disc, endplate, and facet degeneration in the cervical spine. Left chest cardiac pacemaker. No acute osseous abnormality identified. Mild left maxillary sinus mucosal thickening.  Other neck: Negative lung apices. No superior mediastinal lymphadenopathy.  Diminutive thyroid. Larynx, pharynx, parapharyngeal spaces, retropharyngeal space, sublingual space, submandibular glands, and parotid glands are within normal limits. Postoperative changes to the globes. Visualized scalp soft tissues are within normal limits. No cervical lymphadenopathy.  Aortic arch: 3 vessel arch configuration. Moderate mostly calcified arch atherosclerosis. No great vessel origins stenosis.  Right carotid system: Tortuous brachiocephalic artery and proximal right CCA. Normal right CCA origin. Confluent calcified plaque at the right carotid bifurcation, but less than 50 % stenosis with respect to the distal vessel results. Negative cervical right ICA otherwise.  Left carotid system: No  left CCA origin stenosis. Severely tortuous proximal left CCA with a kinked appearance (sagittal image 107 of series 607), but otherwise no proximal left carotid stenosis. Confluent calcified plaque at the left carotid bifurcation. Superimposed left ICA bulb soft plaque. Stenosis up to 65-70 percent occurs at the bulb (see sagittal image 121 of series 607). Beyond the bulb the cervical left ICA is tortuous but otherwise negative.  Vertebral arteries:Tortuous proximal right subclavian artery with a kinked appearance as well as some ulcerated appearing soft plaque (sagittal image 118 of series 607). Dominant left vertebral artery with calcified plaque at its origin and mild to moderate stenosis. Tortuous left V1 segment. Widely patent left vertebral artery otherwise to the skullbase.  No proximal right subclavian artery stenosis despite calcified plaque. Non dominant and diminutive right vertebral artery with no stenosis  at its origin. The right vertebral is highly diminutive and appears to terminate in muscular branches at the C2 level, but is been reconstituted at the C1 level and remains patent into the posterior fossa.  CTA HEAD  Posterior circulation: Dominant distal left vertebral artery is patent without stenosis and primarily supplies the basilar.  Multifocal high-grade segmental stenosis of the non dominant right V4 segment. High-grade stenosis just proximal to the PICA origin but the right PICA origin is patent and is probably supplied in a retrograde fashion from the vertebrobasilar junction.  Irregular basilar artery with atherosclerosis but no stenosis. Ectatic basilar tip. SCA and PCA origins are within normal limits. Posterior communicating arteries are diminutive or absent. Bilateral PCA branches are mildly irregular and patent.  Anterior circulation: Calcified plaque in both ICA siphons. This is maximal in the supraclinoid ICA segments. There is moderate to high-grade stenosis best seen on series 605, image 95. The left ICA terminus remains patent despite this finding. Moderate to severe stenosis also on the right (series 605, image 97). The right ICA terminus is patent.  Dominant left ACA A1 segment. Normal ACA origins. Normal anterior communicating artery. Bilateral ACA branches are within normal limits.  Right MCA origin and M1 segment is within normal limits. Right MCA bifurcation is patent. Right MCA branches are within normal limits.  Left MCA origin is patent. There is mild to moderate irregularity of the left MCA M1 segment but up to only mild stenosis (series 602, image 49). The left MCA bifurcation is patent. The proximal M2 branches are patent. There is attenuated flow in both middle and posterior M2/M3 branches.  Venous sinuses: Patent.  Anatomic variants: Dominant left vertebral artery.  Delayed phase: Continued hypodensity in the insula and anterior left operculum, as well as asymmetric hypodensity in  the subcortical white matter at the level of the corona radiata (ASPECTS score of 7). No abnormal enhancement identified. No intracranial mass effect at this time.  IMPRESSION: 1. No emergent large vessel occlusion. Hemodynamically significant stenosis in distal left M2/M3 MCA branches probably due to thromboembolic disease. 2. Upstream moderate to high-grade stenoses of the left supraclinoid ICA (calcified plaque), and left ICA bulb (65-70%, calcified and soft plaque). 3. Stable CT changes of acute cortically based left MCA infarct (ASPECTS score of 7). 4. Moderate to high-grade right supraclinoid ICA stenosis. No other hemodynamically significant right carotid stenosis. 5. Dominant and dolichoectasia attic left vertebral artery with mild to moderate origin stenosis. Non dominant right vertebral artery with tandem high-grade stenoses in the V4 segment. The right PICA is likely supplied in a retrograde fashion from the left. Basilar artery dolichoectasia. 6. Ulcerated plaque in the  proximal left subclavian artery without stenosis.   Electronically Signed   By: Odessa Fleming M.D.   On: 12/26/2014 15:57   Dg Chest 2 View  12/26/2014   CLINICAL DATA:  Patient found down at 1 p.m. today. Unresponsive. Initial encounter.  EXAM: CHEST  2 VIEW  COMPARISON:  PA and lateral chest 04/23/2014.  FINDINGS: AICD is in place. The lungs are clear. There is mild cardiomegaly. No pneumothorax or pleural effusion.  IMPRESSION: No acute disease.   Electronically Signed   By: Drusilla Kanner M.D.   On: 12/26/2014 18:49   Ct Head Wo Contrast  12/26/2014   CLINICAL DATA:  Code stroke, altered mental status  EXAM: CT HEAD WITHOUT CONTRAST  TECHNIQUE: Contiguous axial images were obtained from the base of the skull through the vertex without intravenous contrast.  COMPARISON:  12/26/2009  FINDINGS: No evidence of parenchymal hemorrhage or extra-axial fluid collection. No mass lesion, mass effect, or midline shift.  Hypodensity in the left  insular cortex (series 2/image 16), suspicious for acute left MCA distribution infarct. Less than 1/3 of the left MCA territory is involved.  Subcortical white matter and periventricular small vessel ischemic changes. Intracranial atherosclerosis.  Mild cortical atrophy.  No ventriculomegaly.  The visualized paranasal sinuses are essentially clear. The mastoid air cells are unopacified.  No evidence of calvarial fracture.  IMPRESSION: Suspected acute left MCA distribution infarct, as above.  Critical Value/emergent results were called by telephone at the time of interpretation on 12/26/2014 at 2:20 pm to Dr. Donnetta Hutching, who verbally acknowledged these results.   Electronically Signed   By: Charline Bills M.D.   On: 12/26/2014 14:23   Ct Angio Neck W/cm &/or Wo/cm  12/26/2014   CLINICAL DATA:  81 year old female altered mental status, found down. Code stroke with left insula changes on noncontrast head CT. Initial encounter.  EXAM: CT ANGIOGRAPHY HEAD AND NECK  TECHNIQUE: Multidetector CT imaging of the head and neck was performed using the standard protocol during bolus administration of intravenous contrast. Multiplanar CT image reconstructions and MIPs were obtained to evaluate the vascular anatomy. Carotid stenosis measurements (when applicable) are obtained utilizing NASCET criteria, using the distal internal carotid diameter as the denominator.  CONTRAST:  75mL OMNIPAQUE IOHEXOL 350 MG/ML SOLN  COMPARISON:  Noncontrast head CT 1409 hours today.  FINDINGS: CTA NECK  Skeleton: Absent dentition. Widespread disc, endplate, and facet degeneration in the cervical spine. Left chest cardiac pacemaker. No acute osseous abnormality identified. Mild left maxillary sinus mucosal thickening.  Other neck: Negative lung apices. No superior mediastinal lymphadenopathy.  Diminutive thyroid. Larynx, pharynx, parapharyngeal spaces, retropharyngeal space, sublingual space, submandibular glands, and parotid glands are within  normal limits. Postoperative changes to the globes. Visualized scalp soft tissues are within normal limits. No cervical lymphadenopathy.  Aortic arch: 3 vessel arch configuration. Moderate mostly calcified arch atherosclerosis. No great vessel origins stenosis.  Right carotid system: Tortuous brachiocephalic artery and proximal right CCA. Normal right CCA origin. Confluent calcified plaque at the right carotid bifurcation, but less than 50 % stenosis with respect to the distal vessel results. Negative cervical right ICA otherwise.  Left carotid system: No left CCA origin stenosis. Severely tortuous proximal left CCA with a kinked appearance (sagittal image 107 of series 607), but otherwise no proximal left carotid stenosis. Confluent calcified plaque at the left carotid bifurcation. Superimposed left ICA bulb soft plaque. Stenosis up to 65-70 percent occurs at the bulb (see sagittal image 121 of series 607). Beyond the bulb  the cervical left ICA is tortuous but otherwise negative.  Vertebral arteries:Tortuous proximal right subclavian artery with a kinked appearance as well as some ulcerated appearing soft plaque (sagittal image 118 of series 607). Dominant left vertebral artery with calcified plaque at its origin and mild to moderate stenosis. Tortuous left V1 segment. Widely patent left vertebral artery otherwise to the skullbase.  No proximal right subclavian artery stenosis despite calcified plaque. Non dominant and diminutive right vertebral artery with no stenosis at its origin. The right vertebral is highly diminutive and appears to terminate in muscular branches at the C2 level, but is been reconstituted at the C1 level and remains patent into the posterior fossa.  CTA HEAD  Posterior circulation: Dominant distal left vertebral artery is patent without stenosis and primarily supplies the basilar.  Multifocal high-grade segmental stenosis of the non dominant right V4 segment. High-grade stenosis just proximal  to the PICA origin but the right PICA origin is patent and is probably supplied in a retrograde fashion from the vertebrobasilar junction.  Irregular basilar artery with atherosclerosis but no stenosis. Ectatic basilar tip. SCA and PCA origins are within normal limits. Posterior communicating arteries are diminutive or absent. Bilateral PCA branches are mildly irregular and patent.  Anterior circulation: Calcified plaque in both ICA siphons. This is maximal in the supraclinoid ICA segments. There is moderate to high-grade stenosis best seen on series 605, image 95. The left ICA terminus remains patent despite this finding. Moderate to severe stenosis also on the right (series 605, image 97). The right ICA terminus is patent.  Dominant left ACA A1 segment. Normal ACA origins. Normal anterior communicating artery. Bilateral ACA branches are within normal limits.  Right MCA origin and M1 segment is within normal limits. Right MCA bifurcation is patent. Right MCA branches are within normal limits.  Left MCA origin is patent. There is mild to moderate irregularity of the left MCA M1 segment but up to only mild stenosis (series 602, image 49). The left MCA bifurcation is patent. The proximal M2 branches are patent. There is attenuated flow in both middle and posterior M2/M3 branches.  Venous sinuses: Patent.  Anatomic variants: Dominant left vertebral artery.  Delayed phase: Continued hypodensity in the insula and anterior left operculum, as well as asymmetric hypodensity in the subcortical white matter at the level of the corona radiata (ASPECTS score of 7). No abnormal enhancement identified. No intracranial mass effect at this time.  IMPRESSION: 1. No emergent large vessel occlusion. Hemodynamically significant stenosis in distal left M2/M3 MCA branches probably due to thromboembolic disease. 2. Upstream moderate to high-grade stenoses of the left supraclinoid ICA (calcified plaque), and left ICA bulb (65-70%,  calcified and soft plaque). 3. Stable CT changes of acute cortically based left MCA infarct (ASPECTS score of 7). 4. Moderate to high-grade right supraclinoid ICA stenosis. No other hemodynamically significant right carotid stenosis. 5. Dominant and dolichoectasia attic left vertebral artery with mild to moderate origin stenosis. Non dominant right vertebral artery with tandem high-grade stenoses in the V4 segment. The right PICA is likely supplied in a retrograde fashion from the left. Basilar artery dolichoectasia. 6. Ulcerated plaque in the proximal left subclavian artery without stenosis.   Electronically Signed   By: Odessa Fleming M.D.   On: 12/26/2014 15:57   US Carotid Bilateral  12/27/2014   CLINICAL DATA:  Cerebrovascular accident.  EXAM: BILATERAL CAROTID DUPLEX ULTRASOUND  TECHNIQUE: Wallace Cullens scale imaging, color Doppler and duplex ultrasound were performed of bilateral carotid and vertebral  arteries in the neck.  COMPARISON:  None.  FINDINGS: Criteria: Quantification of carotid stenosis is based on velocity parameters that correlate the residual internal carotid diameter with NASCET-based stenosis levels, using the diameter of the distal internal carotid lumen as the denominator for stenosis measurement.  The following velocity measurements were obtained:  RIGHT  ICA:  72/19 cm/sec  CCA:  55/13 cm/sec  SYSTOLIC ICA/CCA RATIO:  1.3  DIASTOLIC ICA/CCA RATIO:  1.5  ECA:  183 cm/sec  LEFT  ICA:  117/33 cm/sec  CCA:  59/14 cm/sec  SYSTOLIC ICA/CCA RATIO:  2.0  DIASTOLIC ICA/CCA RATIO:  2.4  ECA:  103 cm/sec  RIGHT CAROTID ARTERY: Moderate irregular, eccentric and partially calcified plaque is noted in the right carotid bulb and proximal right internal carotid artery. By ultrasound imaging it appears to be greater than 50% diameter stenosis, although velocities are not significantly elevated.  RIGHT VERTEBRAL ARTERY:  Not visualized.  LEFT CAROTID ARTERY: Calcified, irregular and eccentric plaque is noted in the  proximal left internal carotid artery which appears to be less than 50% diameter stenosis based on ultrasound and Doppler criteria.  LEFT VERTEBRAL ARTERY:  Antegrade flow is noted.  IMPRESSION: Calcified and irregular plaque formation is noted in the proximal left internal carotid artery consistent with less than 50% diameter stenosis based on ultrasound and Doppler criteria.  Moderate irregular and partially calcified plaque formation is noted in the right carotid bulb and proximal right internal carotid artery which appears to be greater than 50% diameter stenosis based on ultrasound imaging, but velocities are not significantly elevated. CT angiography is recommended for further evaluation.   Electronically Signed   By: Lupita Raider, M.D.   On: 12/27/2014 13:13   Dg Hip Unilat With Pelvis 2-3 Views Right  12/28/2014   CLINICAL DATA:  81 year old female found down today. Large posterior right hip bruise. Right hip pain. Initial encounter.  EXAM: RIGHT HIP (WITH PELVIS) 2-3 VIEWS  COMPARISON:  [facility] CT lumbar spine 07/29/2011  FINDINGS: Femoral heads are normally located. Hip joint spaces appear stable and preserved. No pelvis fracture. The proximal left femur appears grossly intact. Calcified aorta iliac atherosclerosis noted. Small pelvic phleboliths.  The proximal right femur is intact.  IMPRESSION: No acute fracture or dislocation identified about the right hip or pelvis.   Electronically Signed   By: Odessa Fleming M.D.   On: 12/28/2014 16:23    Microbiology: Recent Results (from the past 240 hour(s))  MRSA PCR Screening     Status: None   Collection Time: 12/26/14  7:11 PM  Result Value Ref Range Status   MRSA by PCR NEGATIVE NEGATIVE Final    Comment:        The GeneXpert MRSA Assay (FDA approved for NASAL specimens only), is one component of a comprehensive MRSA colonization surveillance program. It is not intended to diagnose MRSA infection nor to guide or monitor  treatment for MRSA infections.      Labs: Basic Metabolic Panel:  Recent Labs Lab 12/26/14 1417 12/26/14 1424 12/29/14 0653  NA 137 137 138  K 3.6 3.7 3.1*  CL 98* 100* 98*  CO2 26  --  31  GLUCOSE 136* 136* 115*  BUN 27* 29* 12  CREATININE 1.14* 1.10* 0.69  CALCIUM 9.5  --  8.9   Liver Function Tests:  Recent Labs Lab 12/26/14 1417  AST 60*  ALT 22  ALKPHOS 60  BILITOT 0.9  PROT 7.3  ALBUMIN 4.1  No results for input(s): LIPASE, AMYLASE in the last 168 hours. No results for input(s): AMMONIA in the last 168 hours. CBC:  Recent Labs Lab 12/26/14 1417 12/26/14 1424 12/29/14 0653  WBC 14.7*  --  8.3  NEUTROABS 10.9*  --   --   HGB 15.1* 16.0* 14.7  HCT 44.3 47.0* 43.5  MCV 90.6  --  90.8  PLT 173  --  180   Cardiac Enzymes: No results for input(s): CKTOTAL, CKMB, CKMBINDEX, TROPONINI in the last 168 hours. BNP: BNP (last 3 results) No results for input(s): BNP in the last 8760 hours.  ProBNP (last 3 results) No results for input(s): PROBNP in the last 8760 hours.  CBG: No results for input(s): GLUCAP in the last 168 hours.     Signed:  MEMON,JEHANZEB  [facility] 12/29/2014, 9:41 AM

## 2014-12-30 ENCOUNTER — Encounter (HOSPITAL_COMMUNITY)
Admission: AD | Admit: 2014-12-30 | Discharge: 2014-12-30 | Disposition: A | Discharge status: Home / Self Care | Payer: Medicare Other | Source: Skilled Nursing Facility | Attending: Internal Medicine | Admitting: Internal Medicine

## 2014-12-30 ENCOUNTER — Non-Acute Institutional Stay (SKILLED_NURSING_FACILITY): Payer: Medicare Other | Admitting: Internal Medicine

## 2014-12-30 DIAGNOSIS — E876 Hypokalemia: Secondary | ICD-10-CM

## 2014-12-30 DIAGNOSIS — I5022 Chronic systolic (congestive) heart failure: Secondary | ICD-10-CM | POA: Diagnosis not present

## 2014-12-30 DIAGNOSIS — E038 Other specified hypothyroidism: Secondary | ICD-10-CM | POA: Diagnosis not present

## 2014-12-30 DIAGNOSIS — I63512 Cerebral infarction due to unspecified occlusion or stenosis of left middle cerebral artery: Secondary | ICD-10-CM | POA: Diagnosis not present

## 2014-12-30 LAB — LUPUS ANTICOAGULANT PANEL
DRVVT: 34.4 s (ref 0.0–55.1)
PTT Lupus Anticoagulant: 39 s (ref 0.0–50.0)

## 2014-12-30 LAB — URINALYSIS, ROUTINE W REFLEX MICROSCOPIC
Bilirubin Urine: NEGATIVE
Glucose, UA: NEGATIVE mg/dL
Hgb urine dipstick: NEGATIVE
Ketones, ur: NEGATIVE mg/dL
NITRITE: POSITIVE — AB
PROTEIN: NEGATIVE mg/dL
Specific Gravity, Urine: 1.015 (ref 1.005–1.030)
UROBILINOGEN UA: 1 mg/dL (ref 0.0–1.0)
pH: 7.5 (ref 5.0–8.0)

## 2014-12-30 LAB — CBC WITH DIFFERENTIAL/PLATELET
BASOS ABS: 0 10*3/uL (ref 0.0–0.1)
Basophils Relative: 0 % (ref 0–1)
EOS PCT: 2 % (ref 0–5)
Eosinophils Absolute: 0.2 10*3/uL (ref 0.0–0.7)
HCT: 42.5 % (ref 36.0–46.0)
HEMOGLOBIN: 14.5 g/dL (ref 12.0–15.0)
LYMPHS PCT: 21 % (ref 12–46)
Lymphs Abs: 1.9 10*3/uL (ref 0.7–4.0)
MCH: 30.9 pg (ref 26.0–34.0)
MCHC: 34.1 g/dL (ref 30.0–36.0)
MCV: 90.6 fL (ref 78.0–100.0)
MONO ABS: 0.9 10*3/uL (ref 0.1–1.0)
Monocytes Relative: 9 % (ref 3–12)
Neutro Abs: 6.1 10*3/uL (ref 1.7–7.7)
Neutrophils Relative %: 68 % (ref 43–77)
Platelets: 203 10*3/uL (ref 150–400)
RBC: 4.69 MIL/uL (ref 3.87–5.11)
RDW: 13 % (ref 11.5–15.5)
WBC: 9.1 10*3/uL (ref 4.0–10.5)

## 2014-12-30 LAB — BASIC METABOLIC PANEL
Anion gap: 7 (ref 5–15)
BUN: 16 mg/dL (ref 6–20)
CHLORIDE: 101 mmol/L (ref 101–111)
CO2: 29 mmol/L (ref 22–32)
CREATININE: 0.76 mg/dL (ref 0.44–1.00)
Calcium: 8.9 mg/dL (ref 8.9–10.3)
GFR calc non Af Amer: >60 mL/min (ref >60)
Glucose, Bld: 113 mg/dL — ABNORMAL HIGH (ref 65–99)
POTASSIUM: 3.1 mmol/L — AB (ref 3.5–5.1)
SODIUM: 137 mmol/L (ref 135–145)

## 2014-12-30 LAB — PROTEIN S ACTIVITY: PROTEIN S ACTIVITY: 80 % (ref 60–145)

## 2014-12-30 LAB — URINE MICROSCOPIC-ADD ON

## 2014-12-30 LAB — PROTEIN S, TOTAL: PROTEIN S AG TOTAL: 136 % (ref 58–150)

## 2014-12-30 LAB — PROTEIN C ACTIVITY: Protein C Activity: 110 % (ref 74–151)

## 2014-12-30 LAB — PROTHROMBIN GENE MUTATION

## 2014-12-30 LAB — FACTOR 5 LEIDEN
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
## 2014-12-31 ENCOUNTER — Non-Acute Institutional Stay (SKILLED_NURSING_FACILITY): Payer: Medicare Other | Admitting: Internal Medicine

## 2014-12-31 DIAGNOSIS — I63512 Cerebral infarction due to unspecified occlusion or stenosis of left middle cerebral artery: Secondary | ICD-10-CM

## 2014-12-31 DIAGNOSIS — I10 Essential (primary) hypertension: Secondary | ICD-10-CM

## 2014-12-31 DIAGNOSIS — E876 Hypokalemia: Secondary | ICD-10-CM | POA: Diagnosis not present

## 2014-12-31 DIAGNOSIS — I5022 Chronic systolic (congestive) heart failure: Secondary | ICD-10-CM | POA: Diagnosis not present

## 2015-01-01 ENCOUNTER — Encounter (HOSPITAL_COMMUNITY)
Admission: RE | Admit: 2015-01-01 | Discharge: 2015-01-01 | Disposition: A | Discharge status: Home / Self Care | Payer: Medicare Other | Source: Other Acute Inpatient Hospital | Attending: Internal Medicine | Admitting: Internal Medicine

## 2015-01-01 LAB — BASIC METABOLIC PANEL
Anion gap: 10 (ref 5–15)
BUN: 19 mg/dL (ref 6–20)
CHLORIDE: 97 mmol/L — AB (ref 101–111)
CO2: 28 mmol/L (ref 22–32)
CREATININE: 0.9 mg/dL (ref 0.44–1.00)
Calcium: 9.6 mg/dL (ref 8.9–10.3)
GFR calc Af Amer: >60 mL/min (ref >60)
GFR calc non Af Amer: 58 mL/min — ABNORMAL LOW (ref >60)
Glucose, Bld: 106 mg/dL — ABNORMAL HIGH (ref 65–99)
Potassium: 4.4 mmol/L (ref 3.5–5.1)
SODIUM: 135 mmol/L (ref 135–145)

## 2015-01-02 LAB — URINE CULTURE
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
## 2015-01-06 ENCOUNTER — Encounter: Payer: Self-pay | Admitting: Internal Medicine

## 2015-01-06 ENCOUNTER — Non-Acute Institutional Stay (SKILLED_NURSING_FACILITY): Payer: Medicare Other | Admitting: Internal Medicine

## 2015-01-06 DIAGNOSIS — I5022 Chronic systolic (congestive) heart failure: Secondary | ICD-10-CM | POA: Diagnosis not present

## 2015-01-06 DIAGNOSIS — N289 Disorder of kidney and ureter, unspecified: Secondary | ICD-10-CM | POA: Diagnosis not present

## 2015-01-06 DIAGNOSIS — I63512 Cerebral infarction due to unspecified occlusion or stenosis of left middle cerebral artery: Secondary | ICD-10-CM | POA: Diagnosis not present

## 2015-01-06 LAB — BASIC METABOLIC PANEL
Anion gap: 11 (ref 5–15)
BUN: 37 mg/dL — ABNORMAL HIGH (ref 6–20)
CO2: 29 mmol/L (ref 22–32)
Calcium: 10.5 mg/dL — ABNORMAL HIGH (ref 8.9–10.3)
Chloride: 97 mmol/L — ABNORMAL LOW (ref 101–111)
Creatinine, Ser: 1.76 mg/dL — ABNORMAL HIGH (ref 0.44–1.00)
GFR, EST AFRICAN AMERICAN: 30 mL/min — AB (ref >60)
GFR, EST NON AFRICAN AMERICAN: 26 mL/min — AB (ref >60)
GLUCOSE: 102 mg/dL — AB (ref 65–99)
Potassium: 4 mmol/L (ref 3.5–5.1)
Sodium: 137 mmol/L (ref 135–145)
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
## 2015-01-09 ENCOUNTER — Other Ambulatory Visit: Payer: Self-pay

## 2015-01-09 ENCOUNTER — Non-Acute Institutional Stay (SKILLED_NURSING_FACILITY): Payer: Medicare Other | Admitting: Internal Medicine

## 2015-01-09 DIAGNOSIS — T300 Burn of unspecified body region, unspecified degree: Secondary | ICD-10-CM | POA: Diagnosis not present

## 2015-01-09 DIAGNOSIS — IMO0002 Reserved for concepts with insufficient information to code with codable children: Secondary | ICD-10-CM

## 2015-01-09 DIAGNOSIS — I63512 Cerebral infarction due to unspecified occlusion or stenosis of left middle cerebral artery: Secondary | ICD-10-CM | POA: Diagnosis not present

## 2015-01-09 LAB — BASIC METABOLIC PANEL
ANION GAP: 8 (ref 5–15)
BUN: 32 mg/dL — ABNORMAL HIGH (ref 6–20)
CO2: 31 mmol/L (ref 22–32)
Calcium: 9.4 mg/dL (ref 8.9–10.3)
Chloride: 98 mmol/L — ABNORMAL LOW (ref 101–111)
Creatinine, Ser: 1.6 mg/dL — ABNORMAL HIGH (ref 0.44–1.00)
GFR calc non Af Amer: 29 mL/min — ABNORMAL LOW (ref >60)
GFR, EST AFRICAN AMERICAN: 34 mL/min — AB (ref >60)
GLUCOSE: 105 mg/dL — AB (ref 65–99)
Potassium: 3.2 mmol/L — ABNORMAL LOW (ref 3.5–5.1)
SODIUM: 137 mmol/L (ref 135–145)
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
## 2015-01-13 ENCOUNTER — Encounter (HOSPITAL_COMMUNITY)
Admission: RE | Admit: 2015-01-13 | Discharge: 2015-01-13 | Disposition: A | Discharge status: Home / Self Care | Payer: Medicare Other | Source: Skilled Nursing Facility | Attending: Internal Medicine | Admitting: Internal Medicine

## 2015-01-17 ENCOUNTER — Non-Acute Institutional Stay (SKILLED_NURSING_FACILITY): Payer: Medicare Other | Admitting: Internal Medicine

## 2015-01-17 ENCOUNTER — Encounter: Payer: Self-pay | Admitting: Internal Medicine

## 2015-01-17 ENCOUNTER — Ambulatory Visit (INDEPENDENT_AMBULATORY_CARE_PROVIDER_SITE_OTHER): Payer: Medicare Other | Admitting: *Deleted

## 2015-01-17 ENCOUNTER — Encounter (HOSPITAL_COMMUNITY)
Admission: RE | Admit: 2015-01-17 | Discharge: 2015-01-17 | Disposition: A | Discharge status: Home / Self Care | Payer: Medicare Other | Source: Skilled Nursing Facility | Attending: Internal Medicine | Admitting: Internal Medicine

## 2015-01-17 DIAGNOSIS — I5022 Chronic systolic (congestive) heart failure: Secondary | ICD-10-CM

## 2015-01-17 DIAGNOSIS — N289 Disorder of kidney and ureter, unspecified: Secondary | ICD-10-CM | POA: Diagnosis not present

## 2015-01-17 DIAGNOSIS — I10 Essential (primary) hypertension: Secondary | ICD-10-CM

## 2015-01-17 DIAGNOSIS — I255 Ischemic cardiomyopathy: Secondary | ICD-10-CM

## 2015-01-17 LAB — BASIC METABOLIC PANEL
Anion gap: 5 (ref 5–15)
BUN: 19 mg/dL (ref 6–20)
CO2: 29 mmol/L (ref 22–32)
Calcium: 9.1 mg/dL (ref 8.9–10.3)
Chloride: 105 mmol/L (ref 101–111)
Creatinine, Ser: 1.18 mg/dL — ABNORMAL HIGH (ref 0.44–1.00)
GFR calc Af Amer: 49 mL/min — ABNORMAL LOW (ref >60)
GFR, EST NON AFRICAN AMERICAN: 42 mL/min — AB (ref >60)
Glucose, Bld: 92 mg/dL (ref 65–99)
Potassium: 3.6 mmol/L (ref 3.5–5.1)
SODIUM: 139 mmol/L (ref 135–145)

## 2015-01-17 LAB — TSH: TSH: 1.686 u[IU]/mL (ref 0.350–4.500)
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
## 2015-01-17 NOTE — Progress Notes (Signed)
Remote ICD transmission.   

## 2015-01-19 ENCOUNTER — Ambulatory Visit (HOSPITAL_COMMUNITY): Payer: Medicare Other | Admitting: Physical Therapy

## 2015-01-22 LAB — CUP PACEART REMOTE DEVICE CHECK
Battery Remaining Longevity: 11.1
HIGH POWER IMPEDANCE MEASURED VALUE: 60 Ohm
Lead Channel Pacing Threshold Amplitude: 0.5 V
Lead Channel Pacing Threshold Pulse Width: 0.4 ms
Lead Channel Sensing Intrinsic Amplitude: 19 mV
Lead Channel Setting Pacing Amplitude: 2 V
Lead Channel Setting Pacing Pulse Width: 0.4 ms
Lead Channel Setting Sensing Sensitivity: 0.3 mV
MDC IDC MSMT LEADCHNL RV IMPEDANCE VALUE: 703 Ohm
MDC IDC SESS DTM: 20160710103352
MDC IDC SET ZONE DETECTION INTERVAL: 240 ms
MDC IDC SET ZONE DETECTION INTERVAL: 450 ms
MDC IDC STAT BRADY RV PERCENT PACED: 0.1 % — AB
Zone Setting Detection Interval: 300 ms
Zone Setting Detection Interval: 340 ms

## 2015-01-23 LAB — BASIC METABOLIC PANEL
Anion gap: 8 (ref 5–15)
BUN: 19 mg/dL (ref 6–20)
CO2: 28 mmol/L (ref 22–32)
CREATININE: 1.14 mg/dL — AB (ref 0.44–1.00)
Calcium: 9.6 mg/dL (ref 8.9–10.3)
Chloride: 105 mmol/L (ref 101–111)
GFR, EST AFRICAN AMERICAN: 51 mL/min — AB (ref >60)
GFR, EST NON AFRICAN AMERICAN: 44 mL/min — AB (ref >60)
GLUCOSE: 100 mg/dL — AB (ref 65–99)
POTASSIUM: 4 mmol/L (ref 3.5–5.1)
Sodium: 141 mmol/L (ref 135–145)

## 2015-01-27 ENCOUNTER — Non-Acute Institutional Stay (SKILLED_NURSING_FACILITY): Payer: Medicare Other | Admitting: Internal Medicine

## 2015-01-27 DIAGNOSIS — L03119 Cellulitis of unspecified part of limb: Secondary | ICD-10-CM
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
## 2015-01-28 ENCOUNTER — Encounter: Payer: Self-pay | Admitting: Internal Medicine

## 2015-01-28 DIAGNOSIS — L0390 Cellulitis, unspecified: Secondary | ICD-10-CM | POA: Insufficient documentation

## 2015-01-30 LAB — TSH: TSH: 2.375 u[IU]/mL (ref 0.350–4.500)

## 2015-02-06 ENCOUNTER — Telehealth: Payer: Self-pay | Admitting: Internal Medicine

## 2015-02-06 NOTE — Telephone Encounter (Signed)
Spoke with patient's son-in-law. Notified of normal remote transmission and battery longevity. Made aware of recall for Dr. Ladona Ridgel in October. Patient may be relocating to [place] to live with Daughter and Son- in law after stroke (will be discharged from rehab in about 1 week.) Advised to call us if she will be followed elsewhere in the future. Verbalized understanding.

## 2015-02-06 NOTE — Telephone Encounter (Signed)
New message     Pt son's states pt have remote check on July 5th and pt has not heard/seen results from check Please call to discuss

## 2015-02-08 ENCOUNTER — Encounter: Payer: Self-pay | Admitting: *Deleted

## 2015-02-13 ENCOUNTER — Encounter: Payer: Self-pay | Admitting: Internal Medicine

## 2015-02-13 ENCOUNTER — Non-Acute Institutional Stay (SKILLED_NURSING_FACILITY): Payer: Medicare Other | Admitting: Internal Medicine

## 2015-02-13 ENCOUNTER — Inpatient Hospital Stay (HOSPITAL_COMMUNITY): Payer: Medicare Other | Attending: Internal Medicine

## 2015-02-13 DIAGNOSIS — I5022 Chronic systolic (congestive) heart failure: Secondary | ICD-10-CM

## 2015-02-13 DIAGNOSIS — M25519 Pain in unspecified shoulder: Secondary | ICD-10-CM | POA: Insufficient documentation

## 2015-02-13 DIAGNOSIS — M25511 Pain in right shoulder: Secondary | ICD-10-CM | POA: Diagnosis not present

## 2015-02-13 DIAGNOSIS — I63512 Cerebral infarction due to unspecified occlusion or stenosis of left middle cerebral artery: Secondary | ICD-10-CM
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
## 2015-02-14 ENCOUNTER — Ambulatory Visit (HOSPITAL_COMMUNITY)
Admit: 2015-02-14 | Discharge: 2015-02-14 | Disposition: A | Discharge status: Home / Self Care | Payer: Medicare Other | Attending: Internal Medicine | Admitting: Internal Medicine

## 2015-02-14 ENCOUNTER — Encounter (HOSPITAL_COMMUNITY)
Admission: AD | Admit: 2015-02-14 | Discharge: 2015-02-14 | Disposition: A | Discharge status: Home / Self Care | Payer: Medicare Other | Source: Skilled Nursing Facility | Attending: Internal Medicine | Admitting: Internal Medicine

## 2015-02-14 LAB — BASIC METABOLIC PANEL
ANION GAP: 8 (ref 5–15)
BUN: 18 mg/dL (ref 6–20)
CALCIUM: 9.4 mg/dL (ref 8.9–10.3)
CO2: 29 mmol/L (ref 22–32)
Chloride: 102 mmol/L (ref 101–111)
Creatinine, Ser: 1.03 mg/dL — ABNORMAL HIGH (ref 0.44–1.00)
GFR calc Af Amer: 57 mL/min — ABNORMAL LOW (ref >60)
GFR calc non Af Amer: 50 mL/min — ABNORMAL LOW (ref >60)
Glucose, Bld: 101 mg/dL — ABNORMAL HIGH (ref 65–99)
Potassium: 3.3 mmol/L — ABNORMAL LOW (ref 3.5–5.1)
Sodium: 139 mmol/L (ref 135–145)

## 2015-02-14 LAB — CBC WITH DIFFERENTIAL/PLATELET
BASOS PCT: 1 % (ref 0–1)
Basophils Absolute: 0 10*3/uL (ref 0.0–0.1)
EOS PCT: 3 % (ref 0–5)
Eosinophils Absolute: 0.3 10*3/uL (ref 0.0–0.7)
HCT: 38.7 % (ref 36.0–46.0)
Hemoglobin: 12.8 g/dL (ref 12.0–15.0)
Lymphocytes Relative: 30 % (ref 12–46)
Lymphs Abs: 2.6 10*3/uL (ref 0.7–4.0)
MCH: 30.1 pg (ref 26.0–34.0)
MCHC: 33.1 g/dL (ref 30.0–36.0)
MCV: 91.1 fL (ref 78.0–100.0)
Monocytes Absolute: 1.1 10*3/uL — ABNORMAL HIGH (ref 0.1–1.0)
Monocytes Relative: 12 % (ref 3–12)
Neutro Abs: 4.8 10*3/uL (ref 1.7–7.7)
Neutrophils Relative %: 54 % (ref 43–77)
Platelets: 154 10*3/uL (ref 150–400)
RBC: 4.25 MIL/uL (ref 3.87–5.11)
RDW: 13.1 % (ref 11.5–15.5)
WBC: 8.7 10*3/uL (ref 4.0–10.5)

## 2015-02-16 ENCOUNTER — Other Ambulatory Visit (HOSPITAL_COMMUNITY): Payer: Medicare Other

## 2015-02-17 ENCOUNTER — Non-Acute Institutional Stay (SKILLED_NURSING_FACILITY): Payer: Medicare Other | Admitting: Internal Medicine

## 2015-02-17 ENCOUNTER — Encounter: Payer: Self-pay | Admitting: Internal Medicine

## 2015-02-17 DIAGNOSIS — I10 Essential (primary) hypertension: Secondary | ICD-10-CM

## 2015-02-17 DIAGNOSIS — I5022 Chronic systolic (congestive) heart failure: Secondary | ICD-10-CM | POA: Diagnosis not present

## 2015-02-17 DIAGNOSIS — N289 Disorder of kidney and ureter, unspecified: Secondary | ICD-10-CM

## 2015-02-17 DIAGNOSIS — E038 Other specified hypothyroidism: Secondary | ICD-10-CM

## 2015-02-17 DIAGNOSIS — I63512 Cerebral infarction due to unspecified occlusion or stenosis of left middle cerebral artery: Secondary | ICD-10-CM

## 2015-02-17 LAB — BASIC METABOLIC PANEL
Anion gap: 8 (ref 5–15)
BUN: 21 mg/dL — AB (ref 6–20)
CO2: 30 mmol/L (ref 22–32)
Calcium: 9.4 mg/dL (ref 8.9–10.3)
Chloride: 105 mmol/L (ref 101–111)
Creatinine, Ser: 1.15 mg/dL — ABNORMAL HIGH (ref 0.44–1.00)
GFR calc Af Amer: 50 mL/min — ABNORMAL LOW (ref >60)
GFR calc non Af Amer: 43 mL/min — ABNORMAL LOW (ref >60)
GLUCOSE: 94 mg/dL (ref 65–99)
Potassium: 3.7 mmol/L (ref 3.5–5.1)
Sodium: 143 mmol/L (ref 135–145)
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
## 2015-02-21 ENCOUNTER — Encounter: Payer: Self-pay | Admitting: Internal Medicine

## 2015-03-06 ENCOUNTER — Ambulatory Visit: Payer: Medicare Other | Admitting: Cardiology

## 2015-03-13 ENCOUNTER — Ambulatory Visit: Payer: Medicare Other | Admitting: Cardiology

## 2015-03-15 DIAGNOSIS — E559 Vitamin D deficiency, unspecified: Secondary | ICD-10-CM | POA: Insufficient documentation

## 2015-03-15 DIAGNOSIS — Z8673 Personal history of transient ischemic attack (TIA), and cerebral infarction without residual deficits: Secondary | ICD-10-CM | POA: Insufficient documentation

## 2015-03-15 DIAGNOSIS — G629 Polyneuropathy, unspecified: Secondary | ICD-10-CM | POA: Insufficient documentation

## 2015-03-15 DIAGNOSIS — E038 Other specified hypothyroidism: Secondary | ICD-10-CM | POA: Insufficient documentation

## 2015-03-15 DIAGNOSIS — E7800 Pure hypercholesterolemia, unspecified: Secondary | ICD-10-CM | POA: Insufficient documentation

## 2015-03-15 DIAGNOSIS — E063 Autoimmune thyroiditis: Secondary | ICD-10-CM | POA: Insufficient documentation

## 2015-03-15 DIAGNOSIS — I2510 Atherosclerotic heart disease of native coronary artery without angina pectoris: Secondary | ICD-10-CM | POA: Insufficient documentation

## 2015-03-30 IMAGING — CR DG CHEST 2V
2 series · 2 of 2 positions shown · non-contrast
Comparison: 09/23/2010

CLINICAL DATA: Status post pacemaker insertion

EXAM:
CHEST  2 VIEW

[w chest pa]
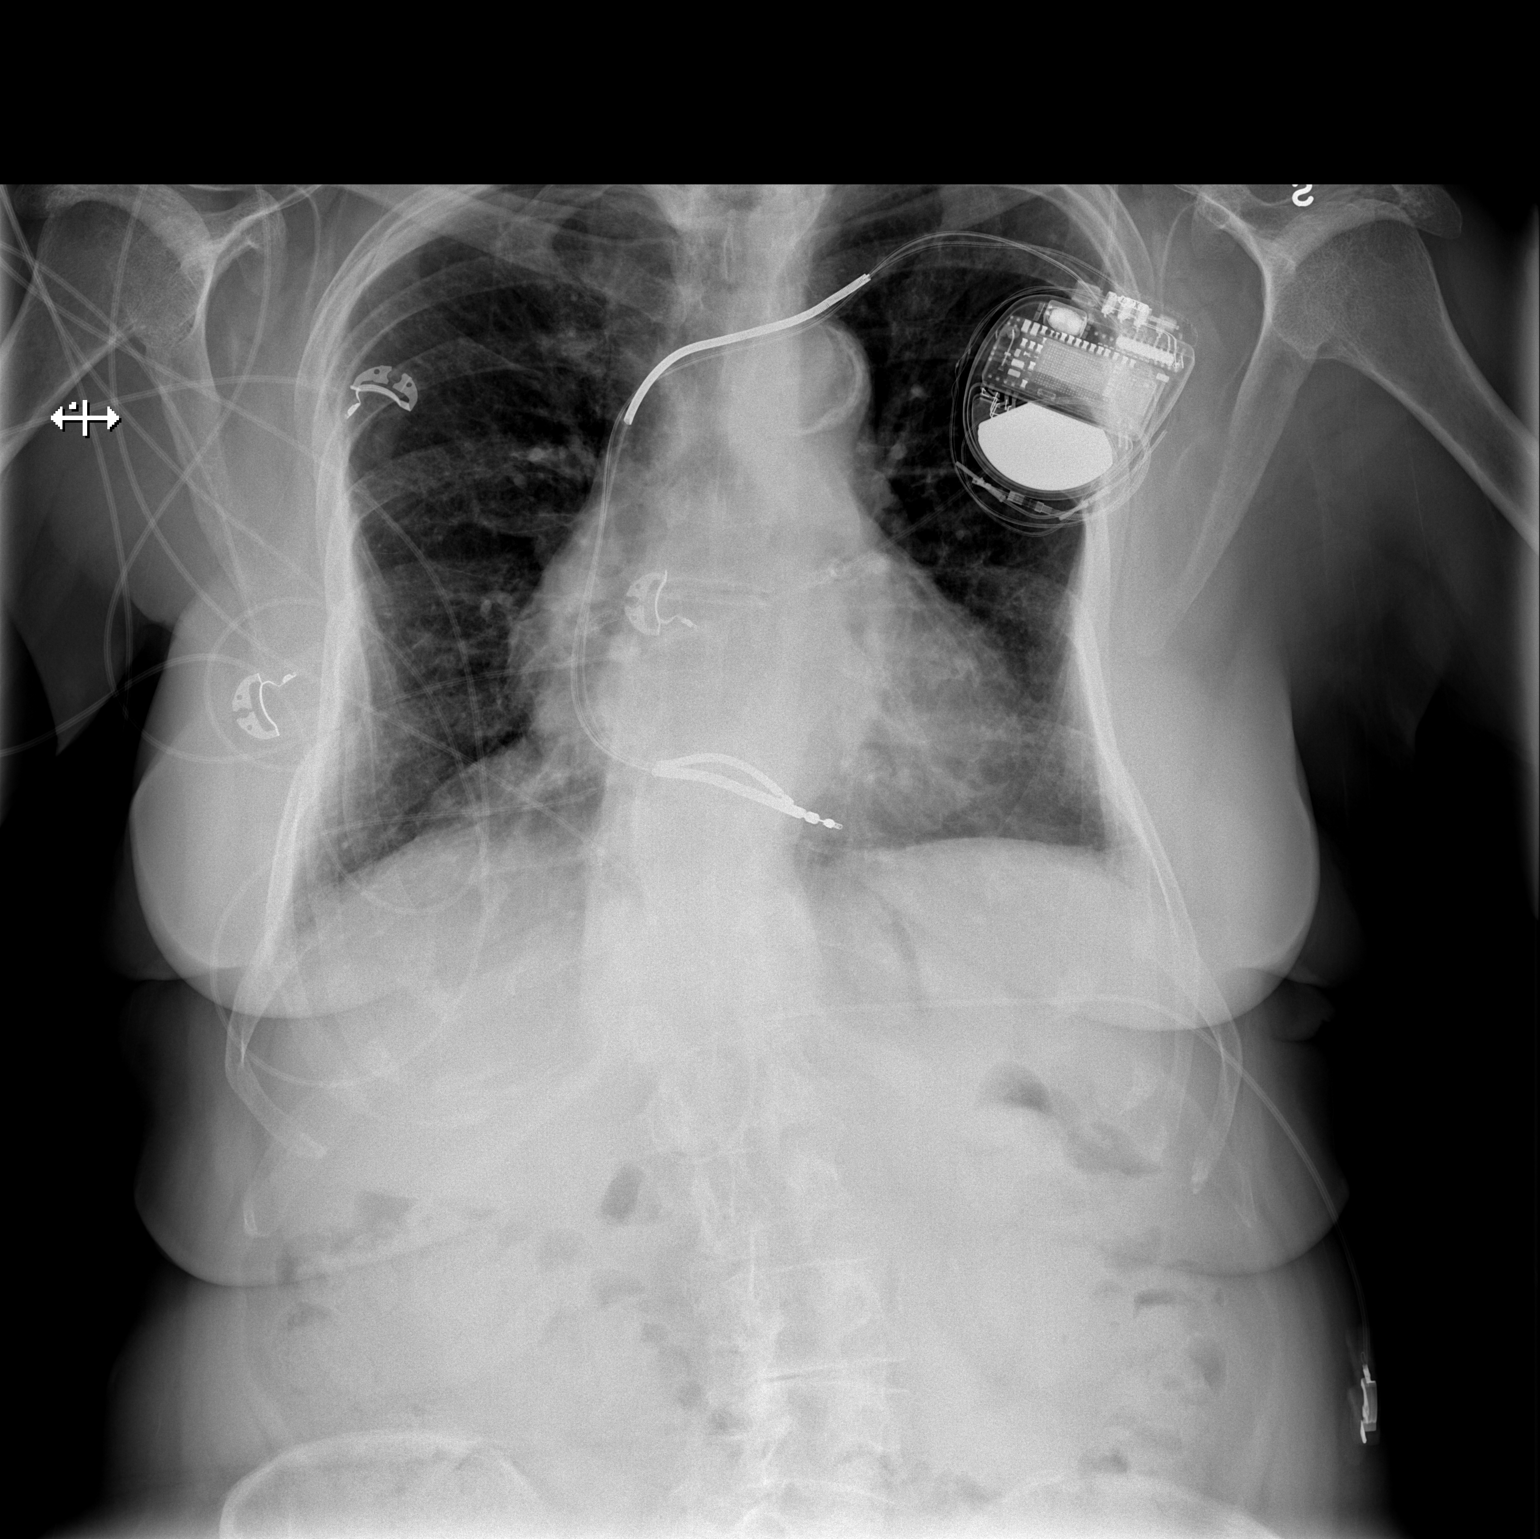

[w chest lat]
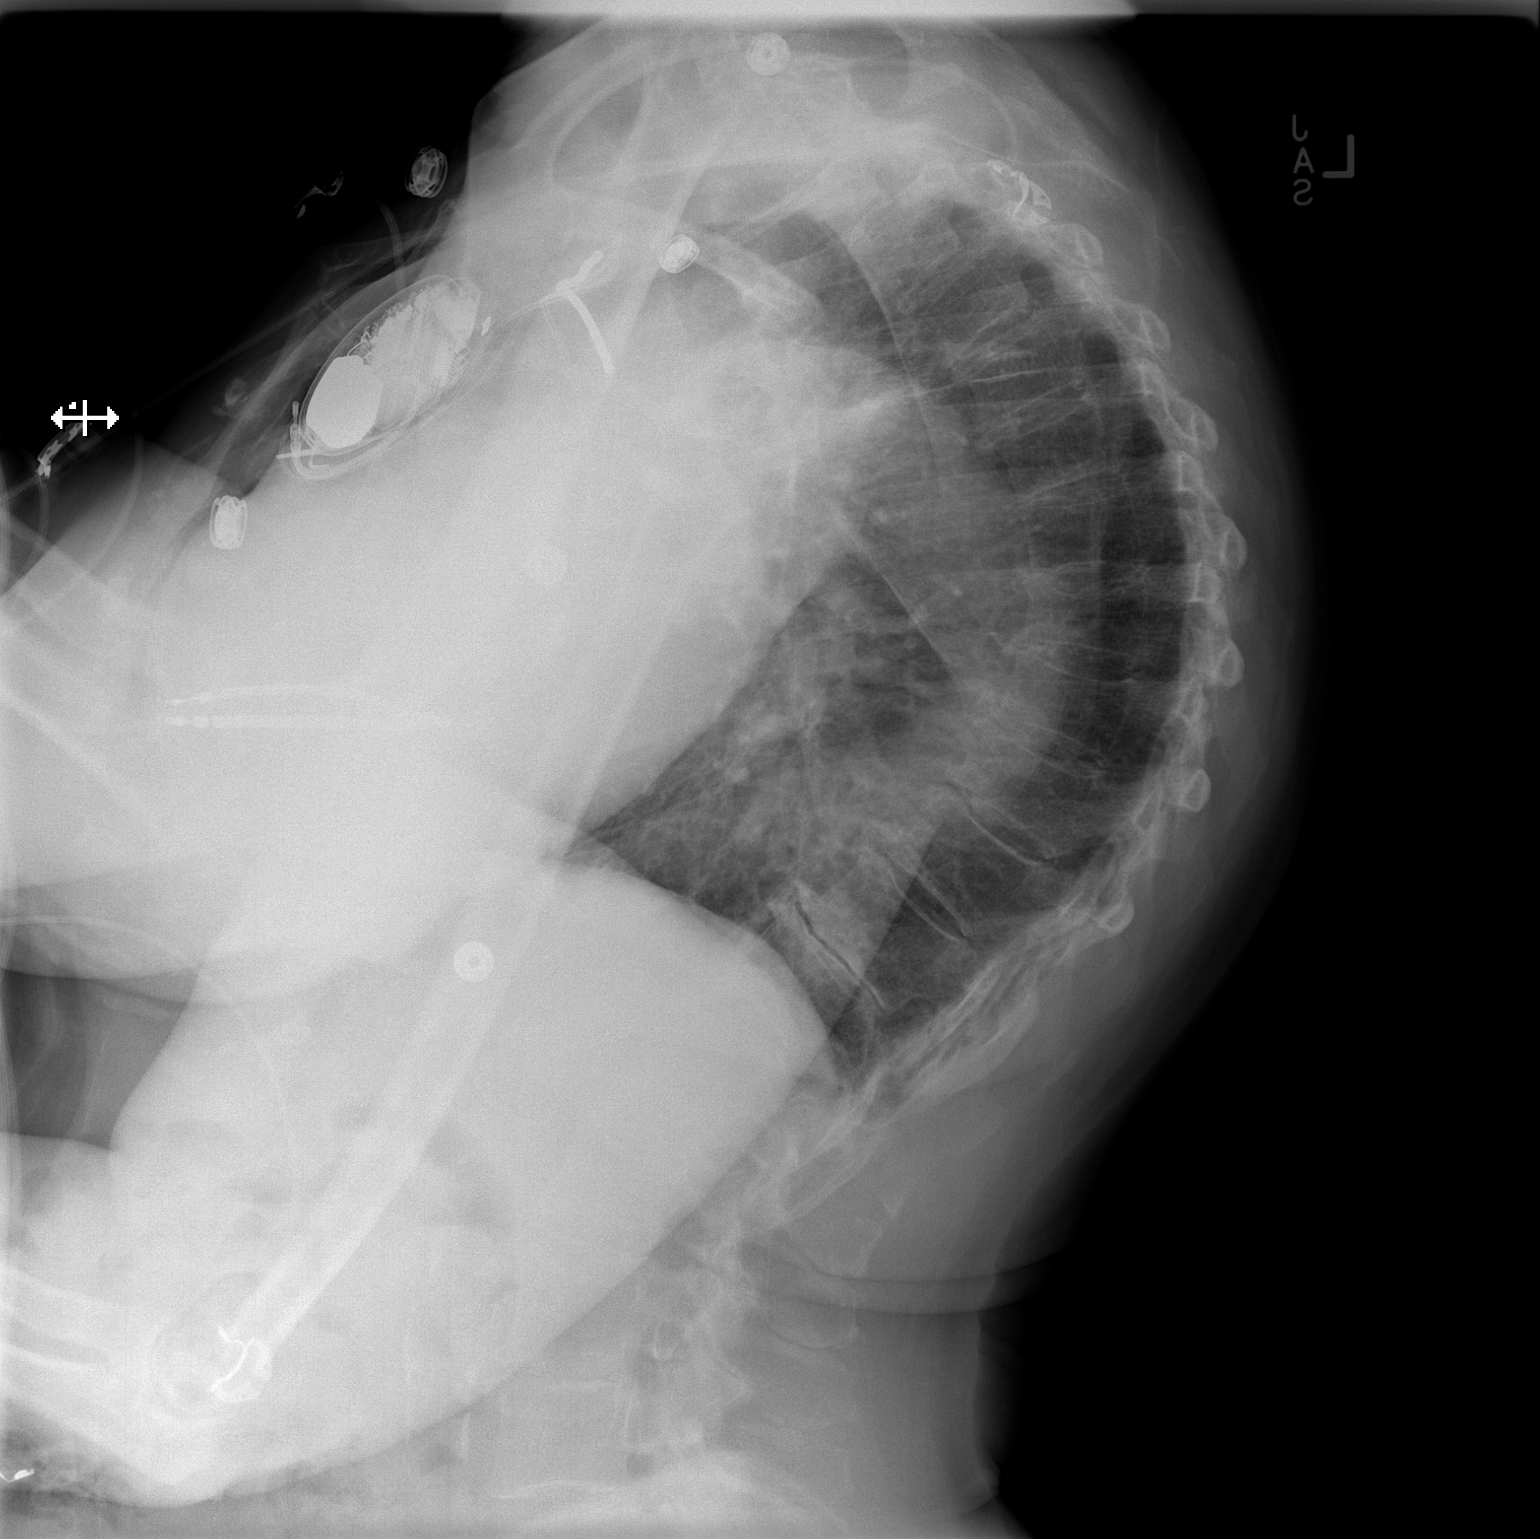

[2 of 2 positions shown; findings below may reference images not displayed]

FINDINGS: The left-sided pacemaker is in good position. The AICD and pacer
wires are in good position. No complicating features. The cardiac
silhouette, mediastinal and hilar contours are stable. Stable
tortuosity and calcification of the thoracic aorta. The lungs are
clear. No pleural effusion or pneumothorax.
IMPRESSION: Pacer wires/ AICD in good position without complicating features.

No acute cardiopulmonary findings.

## 2015-04-12 ENCOUNTER — Ambulatory Visit (INDEPENDENT_AMBULATORY_CARE_PROVIDER_SITE_OTHER): Payer: Medicare Other | Admitting: Cardiology

## 2015-04-12 ENCOUNTER — Encounter: Payer: Self-pay | Admitting: Cardiology

## 2015-04-12 VITALS — BP 122/74 | HR 69 | Ht 61.5 in | Wt 141.4 lb

## 2015-04-12 DIAGNOSIS — E782 Mixed hyperlipidemia: Secondary | ICD-10-CM

## 2015-04-12 DIAGNOSIS — I2510 Atherosclerotic heart disease of native coronary artery without angina pectoris: Secondary | ICD-10-CM | POA: Diagnosis not present

## 2015-04-12 DIAGNOSIS — Z95810 Presence of automatic (implantable) cardiac defibrillator: Secondary | ICD-10-CM | POA: Diagnosis not present

## 2015-04-12 DIAGNOSIS — I255 Ischemic cardiomyopathy: Secondary | ICD-10-CM

## 2015-04-12 DIAGNOSIS — Z8673 Personal history of transient ischemic attack (TIA), and cerebral infarction without residual deficits: Secondary | ICD-10-CM | POA: Diagnosis not present

## 2015-04-12 DIAGNOSIS — R4701 Aphasia: Secondary | ICD-10-CM

## 2015-04-12 NOTE — Patient Instructions (Signed)
Your physician wants you to follow-up in: 6 MONTHS WITH DR. MCDOWELL. You will receive a reminder letter in the mail two months in advance. If you don't receive a letter, please call our office to schedule the follow-up appointment.   Your physician recommends that you continue on your current medications as directed. Please refer to the Current Medication list given to you today.  Thanks for choosing [facility]!!!   

## 2015-04-12 NOTE — Progress Notes (Signed)
Cardiology Office Note  Date: 04/12/2015   ID: [name], DOB 04-Jul-1931, MRN [id number]  PCP: Donzetta Sprung, MD  Primary Cardiologist: Nona Dell, MD   Chief Complaint  Patient presents with  . Coronary Artery Disease  . Cardiomyopathy    History of Present Illness: [name] is an 81 y.o. female last seen in November 2015. Interval follow-up with Dr. Ladona Ridgel noted in January. Interval history includes functional decline with stroke and expressive aphasia, renal insufficiency, and nursing home care. She underwent rehabilitation was and was discharged in August. She is now living in [place] with her daughter. They are in the process of transferring her medical care to [facility]. She has a primary care physician in that area but has not yet established with a cardiologist.  She has an expressive aphasia, and it is difficult to follow her discussion and history. She is here with her son today. There has been no obvious change in cardiac status, no angina symptoms, no leg edema or orthopnea, no device discharges or palpitations. She has had some trouble with right hip pain, uses a cane to walk. Denies any recent falls. Also some numbness around her mouth at times. She is establishing with a neurologist in [place] as well.  Most recent cardiac testing from 2015-2016 is outlined below. Recent ECG also reviewed.  We reviewed her cardiac medications which are outlined below.  Past Medical History  Diagnosis Date  . Essential hypertension, benign   . Automatic implantable cardiac defibrillator in situ     Medtronic  . Hyperlipidemia   . Hypothyroidism   . Depression   . Osteopenia   . Coronary atherosclerosis of native coronary artery     Stent to OM3, DES D1 and RCA 3/12  . B12 deficiency   . Spinal stenosis     Chronic back pain  . Ischemic cardiomyopathy     LVEF 35-40%  . Myocardial infarction     May 2006, March 2012    Past Surgical History    Procedure Laterality Date  . Cardiac defibrillator placement      Medtronic  . Joint replacement    . Replacement total knee Bilateral     Right-April 2006, Left-April 2011  . Tonsillectomy  1952  . Appendectomy  1956  . Abdominal hysterectomy  1971  . Cataract extraction w/phaco Left 10/04/2013    Procedure: CATARACT EXTRACTION PHACO AND INTRAOCULAR LENS PLACEMENT (IOC);  Surgeon: Gemma Payor, MD;  Location: [facility];  Service: Ophthalmology;  Laterality: Left;  CDE 9.37  . Cataract extraction w/phaco Right 10/25/2013    Procedure: CATARACT EXTRACTION PHACO AND INTRAOCULAR LENS PLACEMENT (IOC);  Surgeon: Gemma Payor, MD;  Location: [facility];  Service: Ophthalmology;  Laterality: Right;  CDE 8.57  . Implantable cardioverter defibrillator (icd) generator change N/A 04/22/2014    Procedure: ICD GENERATOR CHANGE;  Surgeon: Marinus Maw, MD;  Location: [facility] CATH LAB;  Service: Cardiovascular;  Laterality: N/A;    Current Outpatient Prescriptions  Medication Sig Dispense Refill  . acetaminophen (TYLENOL) 650 MG CR tablet Take 650 mg by mouth every 8 (eight) hours as needed for pain.     Marland Kitchen aspirin 325 MG tablet Take 1 tablet (325 mg total) by mouth daily.    Marland Kitchen atorvastatin (LIPITOR) 10 MG tablet Take 1 tablet (10 mg total) by mouth daily at 6 PM.    . Calcium Carbonate-Vitamin D (CALTRATE 600+D) 600-400 MG-UNIT per tablet Take 1 tablet by mouth daily.     Marland Kitchen  Cholecalciferol (VITAMIN D) 2000 UNITS CAPS Take 2,000 Units by mouth daily.    . clopidogrel (PLAVIX) 75 MG tablet Take 75 mg by mouth at bedtime.     Marland Kitchen ezetimibe (ZETIA) 10 MG tablet Take 10 mg by mouth daily.    Marland Kitchen levothyroxine (SYNTHROID) 112 MCG tablet Take 100 mcg by mouth daily.     Marland Kitchen loratadine (CLARITIN) 10 MG tablet Take 10 mg by mouth daily as needed for allergies.    Marland Kitchen losartan (COZAAR) 100 MG tablet Take 100 mg by mouth daily.    . metoprolol succinate (TOPROL-XL) 50 MG 24 hr tablet Take 1 tablet (50 mg total) by mouth daily. Take  with or immediately following a meal. 30 tablet 6  . sertraline (ZOLOFT) 100 MG tablet Take 1/2 tab ( ) at bedtime     . vitamin B-12 (CYANOCOBALAMIN) 1000 MCG tablet Take 1,000 mcg by mouth daily.      No current facility-administered medications for this visit.    Allergies:  Nsaids; Oxycodone hcl; Povidone-iodine; Prednisone; Amoxicillin-pot clavulanate; Doxazosin mesylate; Betadine; Cephalexin; Cymbalta; Gabapentin; Neomycin; and Tape   Social History: The patient  reports that she quit smoking about 4 years ago. Her smoking use included Cigarettes. She has a 12.5 pack-year smoking history. She has never used smokeless tobacco. She reports that she does not drink alcohol or use illicit drugs.   ROS:  Please see the history of present illness. Otherwise, complete review of systems is positive for none.  All other systems are reviewed and negative.   Physical Exam: VS:  BP 122/74 mmHg  Pulse 69  Ht 5' 1.5" (1.562 m)  Wt 141 lb 6.4 oz (64.139 kg)  BMI 26.29 kg/m2  SpO2 96%, BMI Body mass index is 26.29 kg/(m^2).  Wt Readings from Last 3 Encounters:  04/12/15 141 lb 6.4 oz (64.139 kg)  12/27/14 159 lb 6.3 oz (72.3 kg)  07/18/14 156 lb (70.761 kg)     Appears comfortable at rest, uses a cane to ambulate.  HEENT: Conjunctiva and lids normal, oropharynx clear.  Neck: Supple, no elevated JVP or carotid bruits, no thyromegaly.  Thorax: Device pocket site stable. Lungs: Clear to auscultation, nonlabored breathing at rest.  Cardiac: Regular rate and rhythm, no S3, 2/6 systolic murmur, no pericardial rub.  Abdomen: Soft, nontender, bowel sounds present, no guarding or rebound.  Extremities: No pitting edema, distal pulses 2+.  Skin: Warm and dry.  Musculoskeletal: Kyphosis. Neuropsychiatric: Alert and oriented 3, expressive aphasia is evident.   ECG: Tracing from 12/27/2014 showed sinus rhythm with PAC, evidence of old inferior and anterolateral infarct.  Recent  Labwork: 12/26/2014: ALT 22; AST 60* 01/30/2015: TSH 2.375 02/14/2015: Hemoglobin 12.8; Platelets 154 02/17/2015: BUN 21*; Creatinine, Ser 1.15*; Potassium 3.7; Sodium 143     Component Value Date/Time   CHOL 207* 12/28/2014 0454   TRIG 225* 12/28/2014 0454   HDL 41 12/28/2014 0454   CHOLHDL 5.0 12/28/2014 0454   VLDL 45* 12/28/2014 0454   LDLCALC 121* 12/28/2014 0454    Other Studies Reviewed Today:  Echocardiogram 12/27/2014: Study Conclusions  - Left ventricle: The cavity size was normal. Wall thickness was increased in a pattern of moderate LVH. Systolic function was normal. The estimated ejection fraction was in the range of 60% to 65%. - Aortic valve: Aortic valve is heavily calcified and seems to have restricted motion but no AS noted on CW. - Mitral valve: Calcified annulus. - Left atrium: The atrium was mildly dilated. - Atrial septum: No  defect or patent foramen ovale was identified. - Pericardium, extracardiac: A trivial pericardial effusion was identified.  Lexiscan Cardiolite 05/30/2014: FINDINGS: Baseline tracing shows sinus bradycardia at 54 beats per min with evidence of previous anterior/inferior infarct, nonspecific ST changes. Lexiscan bolus was given in standard fashion. Heart rate increased from 53 beats per min up to 75 beats per min, and blood pressure increased from 131/67 up to 149/79. There were no diagnostic ST segment changes, and no arrhythmias were noted.  Analysis of the overall perfusion data finds adequate radiotracer uptake within the myocardium, gut uptake adjacent to the inferior wall.  Perfusion: There is a moderate to large, dense, fixed defect within the mid to apical anteroseptal wall, apex, and also mid to apical inferior wall. Region is most consistent with scar, no active ischemia.  Wall Motion: Akinesis of the anteroseptal and apical wall noted.  Left Ventricular Ejection Fraction: 45 %  End diastolic volume 98  ml  End systolic volume 54 ml  IMPRESSION: 1. Abnormal study. Consistent with scar involving the mid to distal anteroseptal, inferior, and apical walls. No large ischemic burden.  2. Akinesis of the anteroseptal and apical wall.  3. Left ventricular ejection fraction 45%  4. Intermediate-risk stress test findings*.   ASSESSMENT AND PLAN:  1. Ischemic heart disease status post previous coronary interventions as outlined above, most recently in 2012. Cardiolite study from last year showed scar within the anteroseptal wall and apex, but no significant ischemic burden. More recently she had an echocardiogram showing normal LVEF. No progressing angina symptoms are reported. Plan is to continue medical therapy. She will be establishing with a cardiology practice in the [place] area.  2. History of ischemic cardiomyopathy with prior placement of Medtronic ICD. This is followed by Dr. Ladona Ridgel, and remote device transmissions continue for now. No reported shocks.  3. Hyperlipidemia, currently on Lipitor and Zetia. She is now following with a primary care provider in [place]. Would aim for LDL around 70 if possible.  4. History of stroke with subsequent expressive aphasia. Not aware of any interval diagnosis of atrial arrhythmias, her last ECG showed sinus rhythm. She is on dual antiplatelets therapy at this time.  Current medicines were reviewed at length with the patient today.   Disposition: FU with me in 6 months unless she establishes with a cardiology practice in [place].   Signed, Jonelle Sidle, MD, [facility] 04/12/2015 11:16 AM    [facility] [address], [place], [place] [address] Phone: [phone]; Fax: [phone]

## 2015-04-17 ENCOUNTER — Encounter: Payer: Medicare Other | Admitting: Internal Medicine

## 2015-04-17 ENCOUNTER — Other Ambulatory Visit: Payer: Self-pay

## 2015-04-18 ENCOUNTER — Encounter: Payer: Self-pay | Admitting: Internal Medicine

## 2015-04-18 ENCOUNTER — Ambulatory Visit (INDEPENDENT_AMBULATORY_CARE_PROVIDER_SITE_OTHER): Payer: Medicare Other | Admitting: Internal Medicine

## 2015-04-18 VITALS — BP 148/90 | HR 59 | Ht 62.0 in | Wt 140.4 lb

## 2015-04-18 DIAGNOSIS — I4901 Ventricular fibrillation: Secondary | ICD-10-CM | POA: Diagnosis not present

## 2015-04-18 DIAGNOSIS — Z95810 Presence of automatic (implantable) cardiac defibrillator: Secondary | ICD-10-CM | POA: Diagnosis not present

## 2015-04-18 DIAGNOSIS — I5022 Chronic systolic (congestive) heart failure: Secondary | ICD-10-CM

## 2015-04-18 DIAGNOSIS — I255 Ischemic cardiomyopathy: Secondary | ICD-10-CM | POA: Diagnosis not present

## 2015-04-18 LAB — CUP PACEART INCLINIC DEVICE CHECK
Brady Statistic RV Percent Paced: 0.06 %
HIGH POWER IMPEDANCE MEASURED VALUE: 209 Ohm
HighPow Impedance: 59 Ohm
Lead Channel Pacing Threshold Amplitude: 0.5 V
Lead Channel Pacing Threshold Pulse Width: 0.4 ms
Lead Channel Sensing Intrinsic Amplitude: 17.25 mV
Lead Channel Setting Pacing Amplitude: 2 V
MDC IDC MSMT BATTERY REMAINING LONGEVITY: 133 mo
MDC IDC MSMT BATTERY VOLTAGE: 3.04 V
MDC IDC MSMT LEADCHNL RV IMPEDANCE VALUE: 646 Ohm
MDC IDC SESS DTM: 20161004150528
MDC IDC SET LEADCHNL RV PACING PULSEWIDTH: 0.4 ms
MDC IDC SET LEADCHNL RV SENSING SENSITIVITY: 0.3 mV
MDC IDC SET ZONE DETECTION INTERVAL: 450 ms
Zone Setting Detection Interval: 240 ms
Zone Setting Detection Interval: 300 ms
Zone Setting Detection Interval: 340 ms

## 2015-04-18 NOTE — Patient Instructions (Addendum)
Medication Instructions:  Your physician recommends that you continue on your current medications as directed. Please refer to the Current Medication list given to you today.   Labwork: None ordered   Testing/Procedures: None ordered   Follow-Up: Remote monitoring is used to monitor your Pacemaker of ICD from home. This monitoring reduces the number of office visits required to check your device to one time per year. It allows [place] to keep an eye on the functioning of your device to ensure it is working properly. You are scheduled for a device check from home on 07/18/15. You may send your transmission at any time that day. If you have a wireless device, the transmission will be sent automatically. After your physician reviews your transmission, you will receive a postcard with your next transmission date.  Your physician wants you to follow-up in: 12 months with Dr Court Joy will receive a reminder letter in the mail two months in advance. If you don't receive a letter, please call our office to schedule the follow-up appointment.   Any Other Special Instructions Will Be Listed Below (If Applicable).  Dr [facility]

## 2015-04-18 NOTE — Progress Notes (Signed)
HPI [name] rreturns today for followup. She is a 81 year old woman with a history of ventricular fibrillation, and ischemic cardiomyopathy, chronic class II systolic heart failure, status post ICD implantation. She has sustained a stroke in the interim. She has a very dense right HP and an expressive aphasia. Allergies  Allergen Reactions  . Nsaids Other (See Comments)    Vomited blood   . Oxycodone Hcl Hives and Rash  . Povidone-Iodine Rash  . Prednisone Nausea Only and Other (See Comments)    FLU SYMPTOMS  . Amoxicillin-Pot Clavulanate Other (See Comments)    VOMITING  . Betadine [Povidone Iodine] Rash    rash  . Doxazosin Mesylate Other (See Comments)    INCONTINENCE   . Cephalexin Rash  . Cymbalta [Duloxetine Hcl] Nausea Only and Other (See Comments)    Headache  . Gabapentin Rash  . Neomycin Rash  . Tape Other (See Comments)    Redness     Current Outpatient Prescriptions  Medication Sig Dispense Refill  . acetaminophen (TYLENOL) 650 MG CR tablet Take 650 mg by mouth every 8 (eight) hours as needed for pain.     Marland Kitchen aspirin 325 MG tablet Take 1 tablet (325 mg total) by mouth daily.    Marland Kitchen atorvastatin (LIPITOR) 10 MG tablet Take 10 mg by mouth at bedtime.    . Calcium Carbonate-Vitamin D (CALTRATE 600+D) 600-400 MG-UNIT per tablet Take 1 tablet by mouth daily.     . Cholecalciferol (VITAMIN D) 2000 UNITS CAPS Take 2,000 Units by mouth daily.    . clopidogrel (PLAVIX) 75 MG tablet Take 75 mg by mouth at bedtime.     Marland Kitchen ezetimibe (ZETIA) 10 MG tablet Take 10 mg by mouth daily.    Marland Kitchen levothyroxine (SYNTHROID) 100 MCG tablet Take 100 mcg by mouth daily before breakfast.     . loratadine (CLARITIN) 10 MG tablet Take 10 mg by mouth daily as needed for allergies.    Marland Kitchen losartan (COZAAR) 100 MG tablet Take 100 mg by mouth at bedtime.     . metoprolol succinate (TOPROL-XL) 50 MG 24 hr tablet Take 1 tablet (50 mg total) by mouth daily. Take with or immediately following a meal. 30  tablet 6  . potassium chloride SA (K-DUR,KLOR-CON) 20 MEQ tablet Take 20 mEq by mouth daily.     . sertraline (ZOLOFT) 100 MG tablet Take 100 mg by mouth at bedtime.     . vitamin B-12 (CYANOCOBALAMIN) 1000 MCG tablet Take 1,000 mcg by mouth daily.      No current facility-administered medications for this visit.     Past Medical History  Diagnosis Date  . Essential hypertension, benign   . Automatic implantable cardiac defibrillator in situ     Medtronic  . Hyperlipidemia   . Hypothyroidism   . Depression   . Osteopenia   . Coronary atherosclerosis of native coronary artery     Stent to OM3, DES D1 and RCA 3/12  . B12 deficiency   . Spinal stenosis     Chronic back pain  . Ischemic cardiomyopathy     LVEF 35-40%  . Myocardial infarction [facility])     May 2006, March 2012    ROS:   All systems reviewed and negative except as noted in the HPI.   Past Surgical History  Procedure Laterality Date  . Cardiac defibrillator placement      Medtronic  . Joint replacement    . Replacement total knee Bilateral  Right-April 2006, Left-April 2011  . Tonsillectomy  1952  . Appendectomy  1956  . Abdominal hysterectomy  1971  . Cataract extraction w/phaco Left 10/04/2013    Procedure: CATARACT EXTRACTION PHACO AND INTRAOCULAR LENS PLACEMENT (IOC);  Surgeon: Gemma Payor, MD;  Location: [facility];  Service: Ophthalmology;  Laterality: Left;  CDE 9.37  . Cataract extraction w/phaco Right 10/25/2013    Procedure: CATARACT EXTRACTION PHACO AND INTRAOCULAR LENS PLACEMENT (IOC);  Surgeon: Gemma Payor, MD;  Location: [facility];  Service: Ophthalmology;  Laterality: Right;  CDE 8.57  . Implantable cardioverter defibrillator (icd) generator change N/A 04/22/2014    Procedure: ICD GENERATOR CHANGE;  Surgeon: Marinus Maw, MD;  Location: [facility] CATH LAB;  Service: Cardiovascular;  Laterality: N/A;     Family History  Problem Relation Age of Onset  . Stroke Mother   . Stroke Father   . Heart attack  Sister   . Heart attack Brother   . Stroke Sister      Social History   Social History  . Marital Status: Widowed    Spouse Name: N/A  . Number of Children: N/A  . Years of Education: N/A   Occupational History  . RETIRED    Social History Main Topics  . Smoking status: Former Smoker -- 0.50 packs/day for 25 years    Types: Cigarettes    Quit date: 09/13/2010  . Smokeless tobacco: Never Used  . Alcohol Use: No  . Drug Use: No  . Sexual Activity: Yes    Birth Control/ Protection: Post-menopausal   Other Topics Concern  . Not on file   Social History Narrative     BP 148/90 mmHg  Pulse 59  Ht  (1.575 m)  Wt 140 lb 6.4 oz (63.685 kg)  BMI 25.67 kg/m2  Physical Exam:  frail appearing 61 -year-old woman, with an expressive aphasia, NAD HEENT: Unremarkable Neck:  7 cm JVD, no thyromegally Back:  No CVA tenderness Lungs:  Clear with no wheezes, rales, or rhonchi. HEART:  Regular rate rhythm, no murmurs, no rubs, no clicks Abd:  soft, positive bowel sounds, no organomegally, no rebound, no guarding Ext:  2 plus pulses, no edema, no cyanosis, no clubbing Skin:  No rashes no nodules Neuro:  CN II through XII intact, motor grossly intact, right HP   DEVICE  Normal device function.  See PaceArt for details.   Assess/Plan:

## 2015-04-19 NOTE — Assessment & Plan Note (Signed)
Her medtronic ICD is working normally. Will recheck in several months. 

## 2015-04-19 NOTE — Assessment & Plan Note (Signed)
She has had no recurrent ventricular arrhythmias. Will follow. 

## 2015-04-19 NOTE — Assessment & Plan Note (Signed)
Her symptoms are class 2. She will continue her current meds. 

## 2015-05-23 ENCOUNTER — Telehealth: Payer: Self-pay | Admitting: *Deleted

## 2015-05-23 NOTE — Telephone Encounter (Signed)
Called inquiring about clearance

## 2015-05-23 NOTE — Telephone Encounter (Signed)
Spoke with daughter Zadie Cleverly( Janice Jarrell ) about cardiac clearance. Was given number to Dr. Horton Finerary Fishburn [phone]. Left message on nurse line to have nurse call our office and fax cardiac clearance.

## 2015-05-23 NOTE — Telephone Encounter (Signed)
Message left on my voice mail from daughter Zadie Cleverly(Janice Jarrell) - waiting on cardiac clearance & waiting on form to be faxed to our office from Dr. Horton Finerary Fishburn.  Forwarding to your office as she [place] in [place].

## 2015-05-26 ENCOUNTER — Encounter: Payer: Self-pay | Admitting: Cardiology

## 2015-06-01 ENCOUNTER — Ambulatory Visit (INDEPENDENT_AMBULATORY_CARE_PROVIDER_SITE_OTHER): Payer: Medicare Other | Admitting: Cardiology

## 2015-06-01 ENCOUNTER — Encounter: Payer: Self-pay | Admitting: Cardiology

## 2015-06-01 VITALS — BP 106/68 | HR 69 | Ht 62.0 in | Wt 138.6 lb

## 2015-06-01 DIAGNOSIS — I255 Ischemic cardiomyopathy: Secondary | ICD-10-CM | POA: Diagnosis not present

## 2015-06-01 DIAGNOSIS — I2510 Atherosclerotic heart disease of native coronary artery without angina pectoris: Secondary | ICD-10-CM | POA: Diagnosis not present

## 2015-06-01 DIAGNOSIS — Z95810 Presence of automatic (implantable) cardiac defibrillator: Secondary | ICD-10-CM

## 2015-06-01 DIAGNOSIS — E782 Mixed hyperlipidemia: Secondary | ICD-10-CM

## 2015-06-01 DIAGNOSIS — Z01818 Encounter for other preprocedural examination: Secondary | ICD-10-CM | POA: Diagnosis not present

## 2015-06-01 DIAGNOSIS — Z8673 Personal history of transient ischemic attack (TIA), and cerebral infarction without residual deficits: Secondary | ICD-10-CM

## 2015-06-01 DIAGNOSIS — I10 Essential (primary) hypertension: Secondary | ICD-10-CM

## 2015-06-01 NOTE — Progress Notes (Signed)
Cardiology Office Note  Date: 06/01/2015   ID: [name], DOB 03-31-1931, MRN [id number]  PCP: Has not yet established with a [place]-based provider  Primary Cardiologist: Marie DellSamuel Pebbles Zeiders, MD  Electrophysiologist: Marie BuntingGregg Taylor, MD  Chief Complaint  Patient presents with  . Preoperative evaluation  . Coronary Artery Disease  . History of cardiomyopathy    History of Present Illness: [name] is an 81 y.o. female last seen in September. She comes back to the office as part of a preoperative evaluation. She has pelvic organ prolapse and has been evaluated by Dr. Cornelia Dunlap for surgical repair. Notes indicate plan for a minimally invasive approach, likely under spinal anesthesia. Procedure has not yet been scheduled.  She lives in [place] with her daughter, has not yet found a primary care provider with whom she is comfortable based on discussion today. She has also not yet established with a cardiology practice in [place].  We discussed her cardiac history and reviewed her medications which are outlined below. Patient's daughter indicates that there have not been any significant problems with compliance. [name] has an expressive aphasia and therefore is a very difficult historian. She does not report any significant angina symptoms or nitroglycerin use however. Her activity level includes walking around the home using a cane, she is able to walk up 14 steps regularly without stopping, also walk outdoors.  She had follow-up with Dr. Ladona Dunlap for ICD interrogation recently in October. Device function was within normal range and she had no evidence of ventricular arrhythmias documented. She does not report any device shocks, palpitations, or syncope.  ECG today shows sinus bradycardia with evidence of previous inferior and anteroseptal infarct.   Past Medical History  Diagnosis Date  . Essential hypertension, benign   . Automatic implantable cardiac defibrillator  in situ     Medtronic  . Hyperlipidemia   . Hypothyroidism   . Depression   . Osteopenia   . Coronary atherosclerosis of native coronary artery     Stent to OM3, DES D1 and RCA 3/12  . B12 deficiency   . Spinal stenosis     Chronic back pain  . Ischemic cardiomyopathy     LVEF 35-40%  . Myocardial infarction [facility](HCC)     May 2006, March 2012    Past Surgical History  Procedure Laterality Date  . Cardiac defibrillator placement      Medtronic  . Joint replacement    . Replacement total knee Bilateral     Right-April 2006, Left-April 2011  . Tonsillectomy  1952  . Appendectomy  1956  . Abdominal hysterectomy  1971  . Cataract extraction w/phaco Left 10/04/2013    Procedure: CATARACT EXTRACTION PHACO AND INTRAOCULAR LENS PLACEMENT (IOC);  Surgeon: Gemma PayorKerry Hunt, MD;  Location: [facility];  Service: Ophthalmology;  Laterality: Left;  CDE 9.37  . Cataract extraction w/phaco Right 10/25/2013    Procedure: CATARACT EXTRACTION PHACO AND INTRAOCULAR LENS PLACEMENT (IOC);  Surgeon: Gemma PayorKerry Hunt, MD;  Location: [facility];  Service: Ophthalmology;  Laterality: Right;  CDE 8.57  . Implantable cardioverter defibrillator (icd) generator change N/A 04/22/2014    Procedure: ICD GENERATOR CHANGE;  Surgeon: Marinus MawGregg W Taylor, MD;  [facility];  Service: Cardiovascular;  Laterality: N/A;    Current Outpatient Prescriptions  Medication Sig Dispense Refill  . acetaminophen (TYLENOL) 650 MG CR tablet Take 650 mg by mouth every 8 (eight) hours as needed for pain.     Marland Kitchen. aspirin 325 MG tablet Take  1 tablet (325 mg total) by mouth daily.    Marland Kitchen atorvastatin (LIPITOR) 10 MG tablet Take 10 mg by mouth at bedtime.    . Calcium Carbonate-Vitamin D (CALTRATE 600+D) 600-400 MG-UNIT per tablet Take 1 tablet by mouth daily.     . clopidogrel (PLAVIX) 75 MG tablet Take 75 mg by mouth at bedtime.     Marland Kitchen ezetimibe (ZETIA) 10 MG tablet Take 10 mg by mouth daily.    Marland Kitchen levothyroxine (SYNTHROID) 100 MCG tablet Take 100 mcg by  mouth daily before breakfast.     . loratadine (CLARITIN) 10 MG tablet Take 10 mg by mouth daily as needed for allergies.    Marland Kitchen losartan (COZAAR) 100 MG tablet Take 100 mg by mouth at bedtime.     . metoprolol succinate (TOPROL-XL) 100 MG 24 hr tablet Take 50 mg by mouth daily. Take with or immediately following a meal.    . potassium chloride SA (K-DUR,KLOR-CON) 20 MEQ tablet Take 20 mEq by mouth daily.     . sertraline (ZOLOFT) 100 MG tablet Take 100 mg by mouth at bedtime.     . vitamin B-12 (CYANOCOBALAMIN) 1000 MCG tablet Take 1,000 mcg by mouth daily.     . Cholecalciferol (VITAMIN D) 2000 UNITS CAPS Take 2,000 Units by mouth daily.    . Cholecalciferol (VITAMIN D) 2000 UNITS CAPS Take by mouth.     No current facility-administered medications for this visit.    Allergies:  Nsaids; Oxycodone hcl; Povidone-iodine; Prednisone; Amoxicillin-pot clavulanate; Betadine; Doxazosin mesylate; Cephalexin; Cymbalta; Gabapentin; Neomycin; and Tape   Social History: The patient  reports that she quit smoking about 4 years ago. Her smoking use included Cigarettes. She has a 12.5 pack-year smoking history. She has never used smokeless tobacco. She reports that she does not drink alcohol or use illicit drugs.   ROS:  Please see the history of present illness. Otherwise, complete review of systems is positive for expressive aphasia, some emotional lability, lower abdominal/pelvic discomfort intermittently.  All other systems are reviewed and negative.   Physical Exam: VS:  BP 106/68 mmHg  Pulse 69  Ht  (1.575 m)  Wt 138 lb 9.6 oz (62.869 kg)  BMI 25.34 kg/m2  SpO2 93%, BMI Body mass index is 25.34 kg/(m^2).  Wt Readings from Last 3 Encounters:  06/01/15 138 lb 9.6 oz (62.869 kg)  04/18/15 140 lb 6.4 oz (63.685 kg)  04/12/15 141 lb 6.4 oz (64.139 kg)     Appears comfortable at rest, uses a cane to ambulate.  HEENT: Conjunctiva and lids normal, oropharynx clear.  Neck: Supple, no elevated  JVP or carotid bruits, no thyromegaly.  Thorax: Device pocket site stable. Lungs: Clear to auscultation, nonlabored breathing at rest.  Cardiac: Regular rate and rhythm, no S3, 2/6 systolic murmur, no pericardial rub.  Abdomen: Soft, nontender, bowel sounds present, no guarding or rebound.  Extremities: No pitting edema, distal pulses 2+.  Skin: Warm and dry.  Musculoskeletal: Kyphosis. Neuropsychiatric: Alert and oriented 3, expressive aphasia is evident.   ECG: ECG is ordered today.  Recent Labwork: 12/26/2014: ALT 22; AST 60* 01/30/2015: TSH 2.375 02/14/2015: Hemoglobin 12.8; Platelets 154 02/17/2015: BUN 21*; Creatinine, Ser 1.15*; Potassium 3.7; Sodium 143     Component Value Date/Time   CHOL 207* 12/28/2014 0454   TRIG 225* 12/28/2014 0454   HDL 41 12/28/2014 0454   CHOLHDL 5.0 12/28/2014 0454   VLDL 45* 12/28/2014 0454   LDLCALC 121* 12/28/2014 0454    Other Studies  Reviewed Today:  Echocardiogram 12/27/2014: Study Conclusions  - Left ventricle: The cavity size was normal. Wall thickness was increased in a pattern of moderate LVH. Systolic function was normal. The estimated ejection fraction was in the range of 60% to 65%. - Aortic valve: Aortic valve is heavily calcified and seems to have restricted motion but no AS noted on CW. - Mitral valve: Calcified annulus. - Left atrium: The atrium was mildly dilated. - Atrial septum: No defect or patent foramen ovale was identified. - Pericardium, extracardiac: A trivial pericardial effusion was identified.  ASSESSMENT AND PLAN:  1. Preoperative evaluation in an 81 year old woman with history of ischemic cardiomyopathy, CAD with previous coronary interventions most recently DES to the first diagonal and RCA in March 2012, Medtronic ICD in place with recently documented normal function. She had an echocardiogram done in June demonstrated improved LVEF to the 60-65% range on medical therapy, does not endorse any  angina symptoms or nitroglycerin use, and describes activity levels exceeding 4 METs without angina. ECG reviewed showing abnormalities consistent with her cardiac disease. She is being considered for minimally invasive surgical repair of pelvic organ prolapse, likely under spinal anesthesia. She should be able to proceed with planned procedure, anticipating overall intermediate perioperative cardiac risk based on comorbidities. No clear indication for stress testing at this time in light of her described functional capacity without angina. Would continue medical therapy. She should be able to hold Plavix 7 days prior to procedure, also reasonable to reduce aspirin to 81 mg daily if needed.  2. Essential hypertension, blood pressure is normal today.  3. Hyperlipidemia, on Lipitor and Zetia.  4. History of stroke with residual expressive aphasia.  5. Medtronic ICD in place, recently interrogated with normal function and no device shocks. This is followed by Dr. Ladona Ridgel in the EP clinic.  Current medicines were reviewed at length with the patient today.   Orders Placed This Encounter  Procedures  . EKG 12-Lead    Disposition: FU with me in 6 months.   Signed, Jonelle Sidle, MD, [facility] 06/01/2015 1:45 PM    [facility] at [facility] [address], [place], [place] [address] Phone: [phone]; Fax: [phone]

## 2015-06-01 NOTE — Patient Instructions (Signed)
Medication Instructions:  Your physician recommends that you continue on your current medications as directed. Please refer to the Current Medication list given to you today.   Labwork: NONE  Testing/Procedures: NONE  Follow-Up: Your physician wants you to follow-up in: 6 MONTHS WITH DR. MCDOWELL. You will receive a reminder letter in the mail two months in advance. If you don't receive a letter, please call our office to schedule the follow-up appointment.   Any Other Special Instructions Will Be Listed Below (If Applicable).       If you need a refill on your cardiac medications before your next appointment, please call your pharmacy.  Thanks for choosing [facility]!!!     

## 2015-06-07 ENCOUNTER — Ambulatory Visit: Payer: Medicare Other | Admitting: Cardiology

## 2015-06-13 ENCOUNTER — Encounter: Payer: Self-pay | Admitting: Internal Medicine

## 2015-06-13 NOTE — Telephone Encounter (Signed)
Informed daughter that the transmission was sent, but being that her mother has a single chamber ICD she (pt) may still need to wear a monitor if the neurologist is trying to find AF as the cause of the CVA. Daughter voiced understanding.

## 2015-07-18 ENCOUNTER — Ambulatory Visit (INDEPENDENT_AMBULATORY_CARE_PROVIDER_SITE_OTHER): Payer: Medicare Other | Admitting: *Deleted

## 2015-07-18 DIAGNOSIS — I255 Ischemic cardiomyopathy: Secondary | ICD-10-CM | POA: Diagnosis not present

## 2015-07-19 NOTE — Progress Notes (Signed)
Remote ICD transmission.   

## 2015-07-25 ENCOUNTER — Telehealth: Payer: Self-pay | Admitting: *Deleted

## 2015-07-25 LAB — CUP PACEART REMOTE DEVICE CHECK
Battery Remaining Longevity: 132 mo
Battery Voltage: 3.03 V
HighPow Impedance: 56 Ohm
Implantable Lead Implant Date: 20151009
Implantable Lead Location: 753860
Lead Channel Impedance Value: 589 Ohm
Lead Channel Pacing Threshold Pulse Width: 0.4 ms
Lead Channel Sensing Intrinsic Amplitude: 16 mV
Lead Channel Setting Pacing Amplitude: 2 V
Lead Channel Setting Pacing Pulse Width: 0.4 ms
Lead Channel Setting Sensing Sensitivity: 0.3 mV
MDC IDC MSMT LEADCHNL RV IMPEDANCE VALUE: 532 Ohm
MDC IDC MSMT LEADCHNL RV PACING THRESHOLD AMPLITUDE: 0.5 V
MDC IDC MSMT LEADCHNL RV SENSING INTR AMPL: 16 mV
MDC IDC SESS DTM: 20170103094224
MDC IDC STAT BRADY RV PERCENT PACED: 0.04 %

## 2015-07-25 NOTE — Telephone Encounter (Signed)
Spoke to daughter about abnormal optivol since 01-2015. Daughter states that pt has not had any obvious wt gain, ShOB, or LE edema. Daughter states that the patient's only c/o is dark, coffee ground-like vomiting x 2 weeks and dark stools. I encouraged daughter to call pt's local doctor about this today. Daughter voiced understanding.   I encouraged daughter to call back if she noticed any CHF type sx's. Daughter voiced understanding.

## 2015-07-26 ENCOUNTER — Encounter: Payer: Self-pay | Admitting: Cardiology

## 2015-09-22 ENCOUNTER — Telehealth: Payer: Self-pay | Admitting: *Deleted

## 2015-09-22 NOTE — Telephone Encounter (Signed)
[place] [facility] [place]  [facility].  Will confirm that patient would like to transfer remote monitoring to [facility].

## 2016-01-20 IMAGING — DX DG SHOULDER 2+V*R*
2 series · 2 of 2 positions shown · non-contrast
Comparison: Humerus series of today's date

CLINICAL DATA: Status post fall 2 weeks ago with persistent right
shoulder pain limited range of motion

EXAM:
RIGHT SHOULDER - 2+ VIEW

[shoulder grashey]
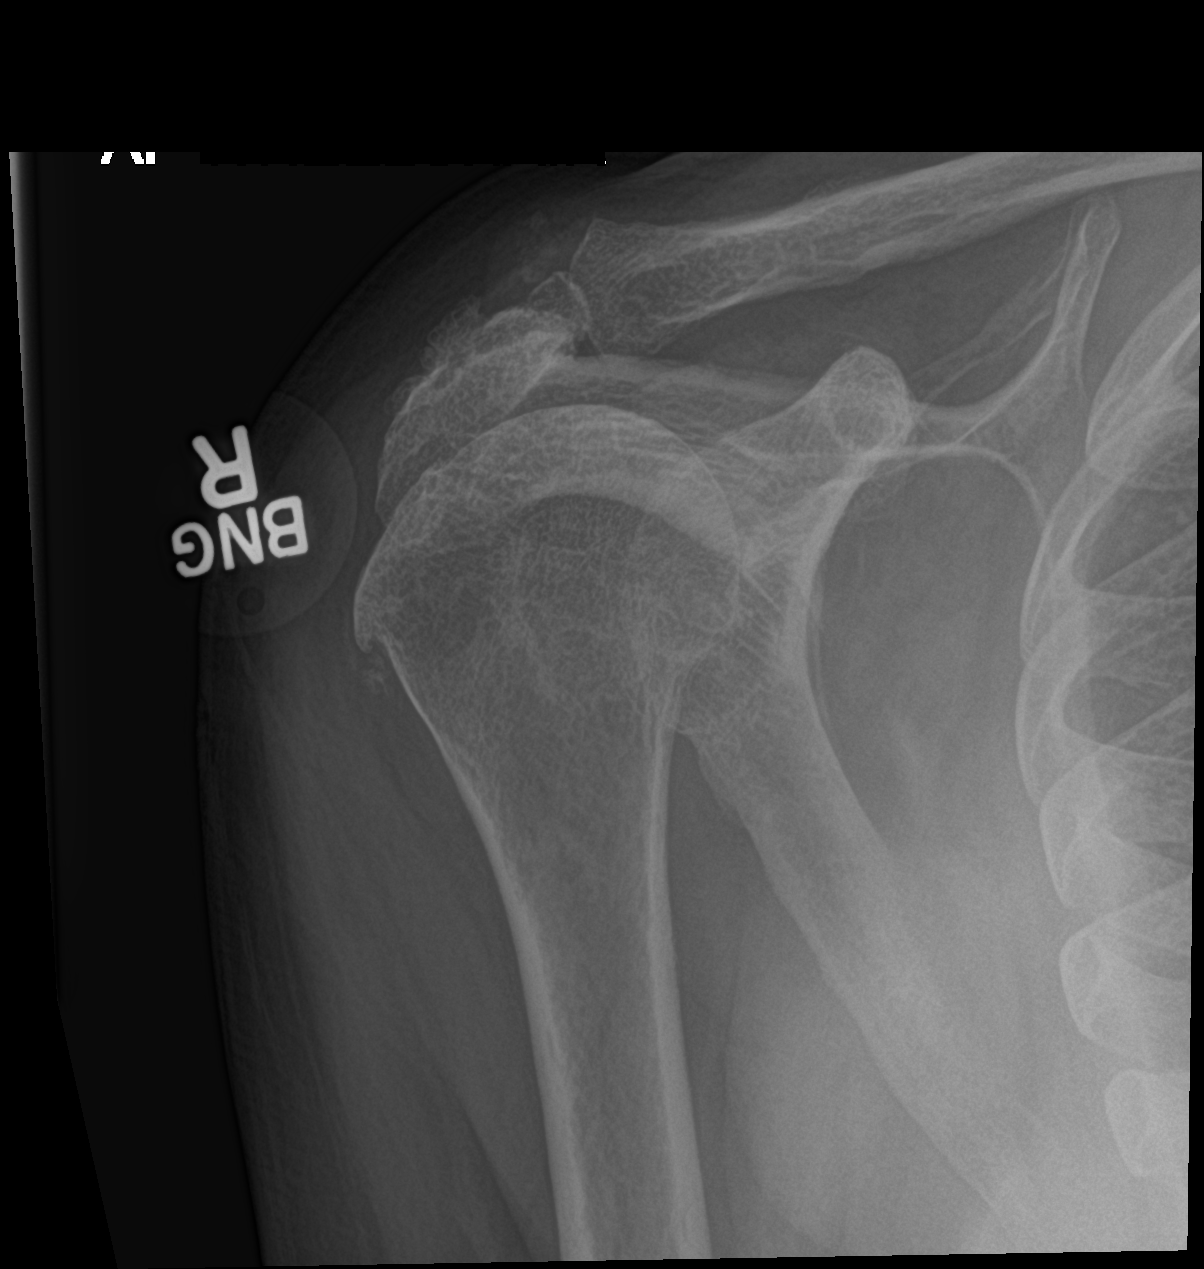

[shoulder y view]
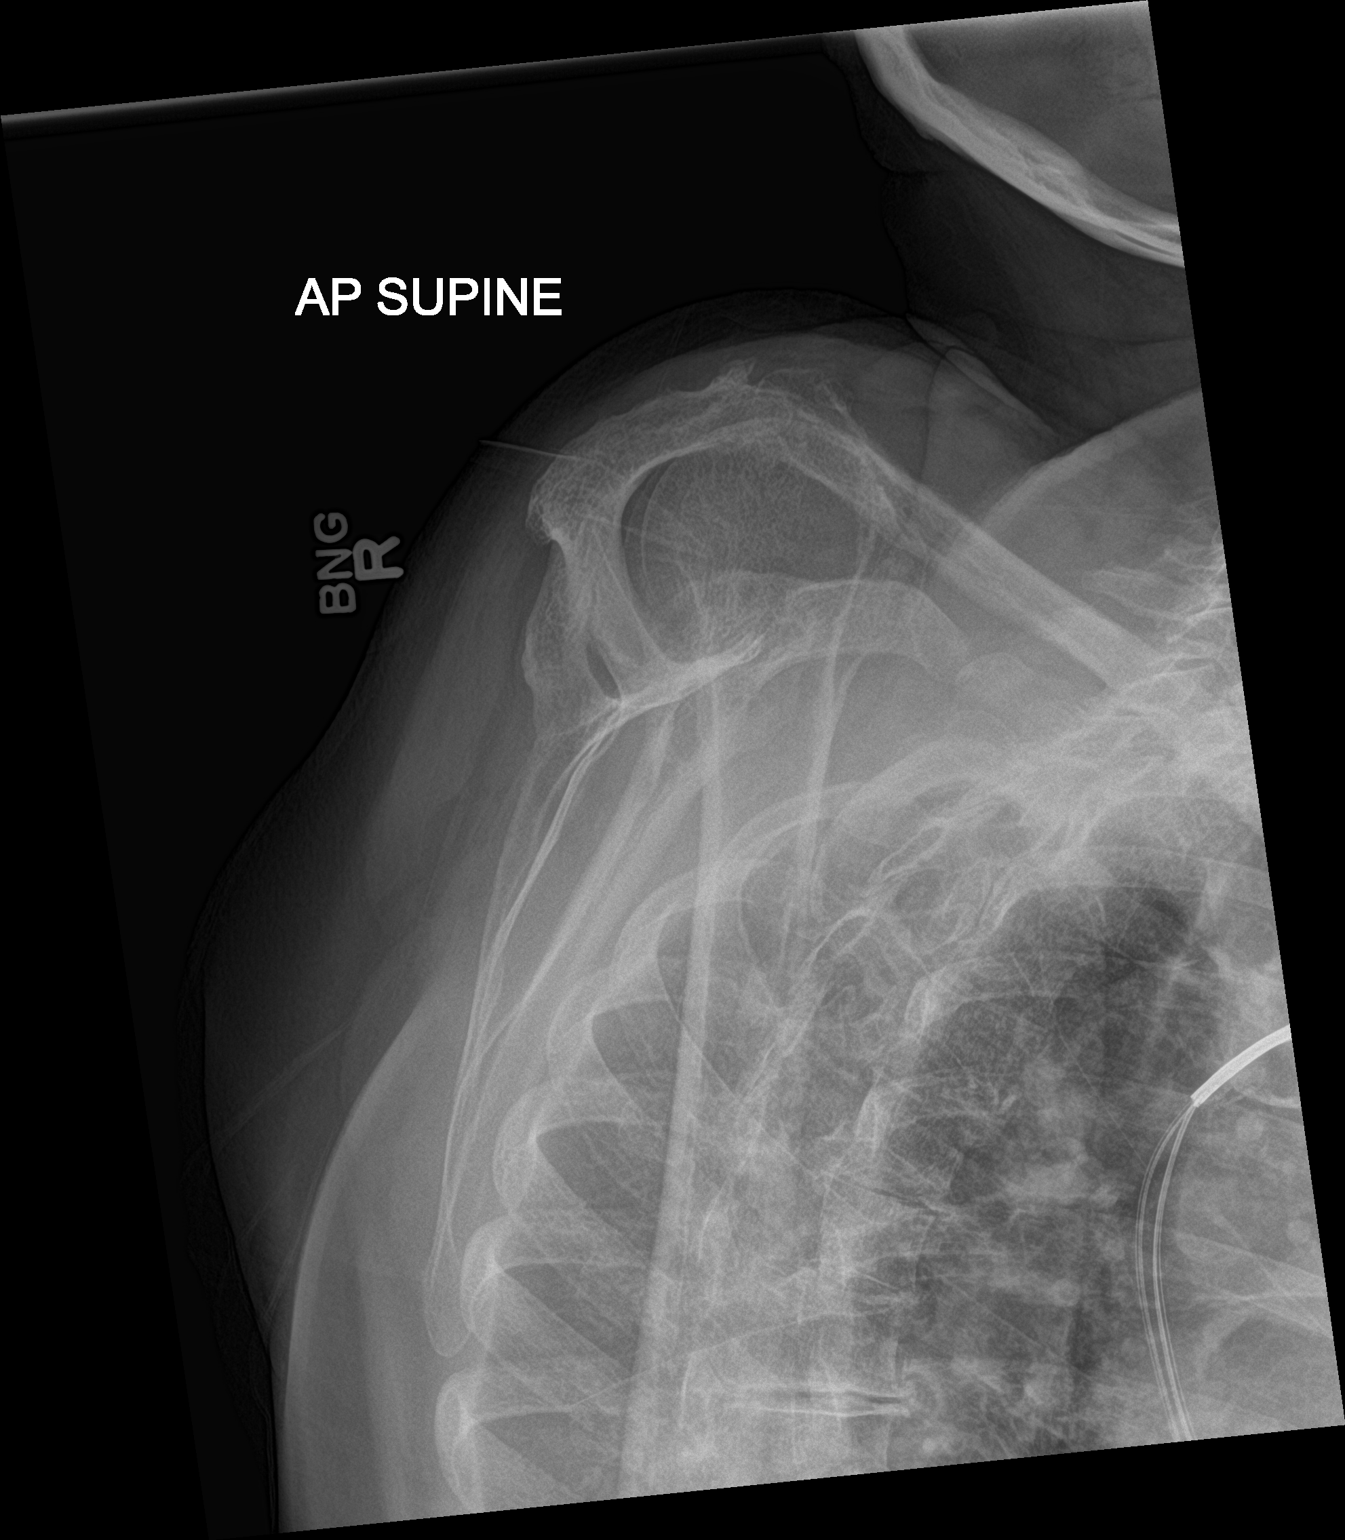

[2 of 2 positions shown; findings below may reference images not displayed]

FINDINGS: There is marked narrowing of the subacromial subdeltoid space. There
is bony irregularity along the dorsal aspect of the acromion with
degenerative spurring of the AC joint. Where visualized the
glenohumeral joint is normal. There is an osteophyte arising from
the superior lateral margin of the humeral head. The observed
portions of the right clavicle and upper right ribs are normal.
IMPRESSION: There is degenerative change of the AC joint and of the glenohumeral
joint without evidence of an acute fracture. There is marked
narrowing of the subacromial subdeltoid space which may indicate
underlying rotator cuff pathology. No acute or healing fracture is
demonstrated.

## 2017-07-23 ENCOUNTER — Encounter: Payer: Self-pay | Admitting: Cardiology

## 2017-08-08 ENCOUNTER — Other Ambulatory Visit: Payer: Self-pay

## 2017-08-08 ENCOUNTER — Emergency Department (HOSPITAL_COMMUNITY): Payer: Medicare Other

## 2017-08-08 ENCOUNTER — Encounter (HOSPITAL_COMMUNITY): Payer: Self-pay | Admitting: *Deleted

## 2017-08-08 ENCOUNTER — Inpatient Hospital Stay (HOSPITAL_COMMUNITY)
Admission: EM | Admit: 2017-08-08 | Discharge: 2017-08-14 | DRG: 689 | Disposition: A | Discharge status: Skilled Nursing Facility | Payer: Medicare Other | Attending: Internal Medicine | Admitting: Internal Medicine

## 2017-08-08 DIAGNOSIS — Z96653 Presence of artificial knee joint, bilateral: Secondary | ICD-10-CM | POA: Diagnosis present

## 2017-08-08 DIAGNOSIS — M8580 Other specified disorders of bone density and structure, unspecified site: Secondary | ICD-10-CM | POA: Diagnosis present

## 2017-08-08 DIAGNOSIS — M4800 Spinal stenosis, site unspecified: Secondary | ICD-10-CM | POA: Diagnosis present

## 2017-08-08 DIAGNOSIS — I69311 Memory deficit following cerebral infarction: Secondary | ICD-10-CM

## 2017-08-08 DIAGNOSIS — N390 Urinary tract infection, site not specified: Secondary | ICD-10-CM | POA: Diagnosis not present

## 2017-08-08 DIAGNOSIS — Z87891 Personal history of nicotine dependence: Secondary | ICD-10-CM

## 2017-08-08 DIAGNOSIS — I255 Ischemic cardiomyopathy: Secondary | ICD-10-CM | POA: Diagnosis present

## 2017-08-08 DIAGNOSIS — D649 Anemia, unspecified: Secondary | ICD-10-CM | POA: Diagnosis present

## 2017-08-08 DIAGNOSIS — Z885 Allergy status to narcotic agent status: Secondary | ICD-10-CM

## 2017-08-08 DIAGNOSIS — Z91041 Radiographic dye allergy status: Secondary | ICD-10-CM

## 2017-08-08 DIAGNOSIS — Z66 Do not resuscitate: Secondary | ICD-10-CM | POA: Diagnosis present

## 2017-08-08 DIAGNOSIS — Z79890 Hormone replacement therapy (postmenopausal): Secondary | ICD-10-CM

## 2017-08-08 DIAGNOSIS — G92 Toxic encephalopathy: Secondary | ICD-10-CM | POA: Diagnosis present

## 2017-08-08 DIAGNOSIS — F0150 Vascular dementia without behavioral disturbance: Secondary | ICD-10-CM | POA: Diagnosis present

## 2017-08-08 DIAGNOSIS — G9340 Encephalopathy, unspecified: Secondary | ICD-10-CM | POA: Diagnosis present

## 2017-08-08 DIAGNOSIS — I4891 Unspecified atrial fibrillation: Secondary | ICD-10-CM

## 2017-08-08 DIAGNOSIS — Z823 Family history of stroke: Secondary | ICD-10-CM

## 2017-08-08 DIAGNOSIS — G8929 Other chronic pain: Secondary | ICD-10-CM | POA: Diagnosis present

## 2017-08-08 DIAGNOSIS — B964 Proteus (mirabilis) (morganii) as the cause of diseases classified elsewhere: Secondary | ICD-10-CM | POA: Diagnosis present

## 2017-08-08 DIAGNOSIS — Z7982 Long term (current) use of aspirin: Secondary | ICD-10-CM

## 2017-08-08 DIAGNOSIS — E876 Hypokalemia: Secondary | ICD-10-CM | POA: Diagnosis present

## 2017-08-08 DIAGNOSIS — Z8249 Family history of ischemic heart disease and other diseases of the circulatory system: Secondary | ICD-10-CM

## 2017-08-08 DIAGNOSIS — N183 Chronic kidney disease, stage 3 unspecified: Secondary | ICD-10-CM | POA: Diagnosis present

## 2017-08-08 DIAGNOSIS — I5022 Chronic systolic (congestive) heart failure: Secondary | ICD-10-CM | POA: Diagnosis present

## 2017-08-08 DIAGNOSIS — A498 Other bacterial infections of unspecified site: Secondary | ICD-10-CM | POA: Diagnosis present

## 2017-08-08 DIAGNOSIS — I10 Essential (primary) hypertension: Secondary | ICD-10-CM | POA: Diagnosis present

## 2017-08-08 DIAGNOSIS — Z955 Presence of coronary angioplasty implant and graft: Secondary | ICD-10-CM

## 2017-08-08 DIAGNOSIS — Z91048 Other nonmedicinal substance allergy status: Secondary | ICD-10-CM

## 2017-08-08 DIAGNOSIS — Z90710 Acquired absence of both cervix and uterus: Secondary | ICD-10-CM

## 2017-08-08 DIAGNOSIS — I130 Hypertensive heart and chronic kidney disease with heart failure and stage 1 through stage 4 chronic kidney disease, or unspecified chronic kidney disease: Secondary | ICD-10-CM | POA: Diagnosis present

## 2017-08-08 DIAGNOSIS — R410 Disorientation, unspecified: Secondary | ICD-10-CM

## 2017-08-08 DIAGNOSIS — E039 Hypothyroidism, unspecified: Secondary | ICD-10-CM | POA: Diagnosis present

## 2017-08-08 DIAGNOSIS — Z880 Allergy status to penicillin: Secondary | ICD-10-CM

## 2017-08-08 DIAGNOSIS — Z7902 Long term (current) use of antithrombotics/antiplatelets: Secondary | ICD-10-CM

## 2017-08-08 DIAGNOSIS — I69320 Aphasia following cerebral infarction: Secondary | ICD-10-CM

## 2017-08-08 DIAGNOSIS — Z881 Allergy status to other antibiotic agents status: Secondary | ICD-10-CM

## 2017-08-08 DIAGNOSIS — Z888 Allergy status to other drugs, medicaments and biological substances status: Secondary | ICD-10-CM

## 2017-08-08 DIAGNOSIS — F05 Delirium due to known physiological condition: Secondary | ICD-10-CM | POA: Diagnosis not present

## 2017-08-08 DIAGNOSIS — I2510 Atherosclerotic heart disease of native coronary artery without angina pectoris: Secondary | ICD-10-CM | POA: Diagnosis present

## 2017-08-08 DIAGNOSIS — Z8673 Personal history of transient ischemic attack (TIA), and cerebral infarction without residual deficits: Secondary | ICD-10-CM

## 2017-08-08 DIAGNOSIS — I480 Paroxysmal atrial fibrillation: Secondary | ICD-10-CM | POA: Diagnosis present

## 2017-08-08 DIAGNOSIS — Z95810 Presence of automatic (implantable) cardiac defibrillator: Secondary | ICD-10-CM | POA: Diagnosis present

## 2017-08-08 DIAGNOSIS — I252 Old myocardial infarction: Secondary | ICD-10-CM

## 2017-08-08 DIAGNOSIS — E785 Hyperlipidemia, unspecified: Secondary | ICD-10-CM | POA: Diagnosis present

## 2017-08-08 LAB — CBC WITH DIFFERENTIAL/PLATELET
BASOS PCT: 0 %
Basophils Absolute: 0 10*3/uL (ref 0.0–0.1)
Eosinophils Absolute: 0.2 10*3/uL (ref 0.0–0.7)
Eosinophils Relative: 2 %
HEMATOCRIT: 34.5 % — AB (ref 36.0–46.0)
HEMOGLOBIN: 10.8 g/dL — AB (ref 12.0–15.0)
LYMPHS PCT: 21 %
Lymphs Abs: 1.8 10*3/uL (ref 0.7–4.0)
MCH: 25.7 pg — ABNORMAL LOW (ref 26.0–34.0)
MCHC: 31.3 g/dL (ref 30.0–36.0)
MCV: 82.1 fL (ref 78.0–100.0)
Monocytes Absolute: 1 10*3/uL (ref 0.1–1.0)
Monocytes Relative: 11 %
NEUTROS ABS: 5.8 10*3/uL (ref 1.7–7.7)
NEUTROS PCT: 66 %
Platelets: 225 10*3/uL (ref 150–400)
RBC: 4.2 MIL/uL (ref 3.87–5.11)
RDW: 15.4 % (ref 11.5–15.5)
WBC: 8.7 10*3/uL (ref 4.0–10.5)

## 2017-08-08 LAB — I-STAT TROPONIN, ED: Troponin i, poc: 0.02 ng/mL (ref 0.00–0.08)

## 2017-08-08 LAB — I-STAT CG4 LACTIC ACID, ED: Lactic Acid, Venous: 0.76 mmol/L (ref 0.5–1.9)

## 2017-08-08 NOTE — ED Provider Notes (Addendum)
[facility] EMERGENCY DEPARTMENT Provider Note   CSN: [id number] Arrival date & time: 08/08/17  2254     History   Chief Complaint Chief Complaint  Patient presents with  . Altered Mental Status    HPI [name] is a 84 y.o. female.  Patient brought to the emergency department for evaluation of altered mental status.  Patient is brought by EMS.  They have very little history to offer.  Patient reportedly became combative earlier tonight which is unusual for her.  EMS report that it took multiple individuals to hold her down and she required Versed 4 mg IM in order to transport.  At arrival patient is extremely sleepy and does not have any further information.  EMS report that they were told that the patient just started treatment for urinary tract infection.      Past Medical History:  Diagnosis Date  . Automatic implantable cardiac defibrillator in situ    Medtronic  . B12 deficiency   . Coronary atherosclerosis of native coronary artery    Stent to OM3, DES D1 and RCA 3/12  . Depression   . Essential hypertension, benign   . Hyperlipidemia   . Hypothyroidism   . Ischemic cardiomyopathy    LVEF 35-40%  . Myocardial infarction [facility](HCC)    May 2006, March 2012  . Osteopenia   . Spinal stenosis    Chronic back pain    Patient Active Problem List   Diagnosis Date Noted  . CAD in native artery 03/15/2015  . Cerebrovascular accident, old 03/15/2015  . Hypothyroidism due to Hashimoto's thyroiditis 03/15/2015  . Neuropathy 03/15/2015  . Avitaminosis D 03/15/2015  . Hypercholesterolemia without hypertriglyceridemia 03/15/2015  . Pain in joint, shoulder region 02/13/2015  . Cellulitis 01/28/2015  . Renal insufficiency 01/06/2015  . Hypothyroidism 12/28/2014  . Expressive aphasia 12/28/2014  . Acute ischemic left MCA stroke (HCC) 12/26/2014  . Exertional dyspnea 05/23/2014  . Spider veins of both lower extremities 05/10/2014  . Varicose veins of leg with  complications 05/10/2014  . ICD (implantable cardioverter-defibrillator) in place 04/22/2014  . VF (ventricular fibrillation) (HCC) 03/29/2014  . Mixed hyperlipidemia 04/21/2013  . Chronic systolic heart failure (HCC) 03/26/2013  . Ischemic cardiomyopathy   . Coronary atherosclerosis of native coronary artery   . COPD 02/27/2009  . SPINAL STENOSIS 02/27/2009  . Essential hypertension, benign 02/01/2009  . Automatic implantable cardioverter-defibrillator in situ 02/01/2009    Past Surgical History:  Procedure Laterality Date  . ABDOMINAL HYSTERECTOMY  1971  . APPENDECTOMY  1956  . CARDIAC DEFIBRILLATOR PLACEMENT     Medtronic  . CATARACT EXTRACTION W/PHACO Left 10/04/2013   Procedure: CATARACT EXTRACTION PHACO AND INTRAOCULAR LENS PLACEMENT (IOC);  Surgeon: Gemma PayorKerry Hunt, MD;  Location: [facility];  Service: Ophthalmology;  Laterality: Left;  CDE 9.37  . CATARACT EXTRACTION W/PHACO Right 10/25/2013   Procedure: CATARACT EXTRACTION PHACO AND INTRAOCULAR LENS PLACEMENT (IOC);  Surgeon: Gemma PayorKerry Hunt, MD;  Location: [facility];  Service: Ophthalmology;  Laterality: Right;  CDE 8.57  . IMPLANTABLE CARDIOVERTER DEFIBRILLATOR (ICD) GENERATOR CHANGE N/A 04/22/2014   Procedure: ICD GENERATOR CHANGE;  Surgeon: Marinus MawGregg W Taylor, MD;  [facility];  Service: Cardiovascular;  Laterality: N/A;  . JOINT REPLACEMENT    . REPLACEMENT TOTAL KNEE Bilateral    Right-April 2006, Left-April 2011  . TONSILLECTOMY  1952    OB History    No data available       Home Medications    Prior to Admission medications  Medication Sig Start Date End Date Taking? Authorizing Provider  acetaminophen (TYLENOL) 650 MG CR tablet Take 650 mg by mouth every 8 (eight) hours as needed for pain.    Yes [provider]  aspirin 81 MG chewable tablet Chew 81 mg by mouth daily.   Yes [provider]  atorvastatin (LIPITOR) 40 MG tablet Take 40 mg by mouth at bedtime.    Yes [provider]    Cholecalciferol (VITAMIN D) 2000 UNITS CAPS Take 2,000 Units by mouth daily.   Yes [provider]  ciprofloxacin (CIPRO) 250 MG tablet Take 250 mg by mouth 2 (two) times daily. 7 day course starting on 08/07/2017   Yes [provider]  clopidogrel (PLAVIX) 75 MG tablet Take 75 mg by mouth at bedtime.  03/14/15  Yes [provider]  docusate sodium (COLACE) 100 MG capsule Take 100 mg by mouth daily.   Yes [provider]  levothyroxine (SYNTHROID) 100 MCG tablet Take 100 mcg by mouth daily before breakfast.  03/17/15 08/08/17 Yes [provider]  Melatonin 10 MG TABS Take 10 mg by mouth every evening.   Yes [provider]  metoprolol tartrate (LOPRESSOR) 25 MG tablet Take 25 mg by mouth 2 (two) times daily.   Yes [provider]  misoprostol (CYTOTEC) 100 MCG tablet Take 100 mcg by mouth 2 (two) times daily.   Yes [provider]  potassium chloride (K-DUR,KLOR-CON) 10 MEQ tablet Take 10 mEq by mouth daily.  03/16/15  Yes [provider]  risperiDONE (RISPERDAL) 1 MG tablet Take 1 mg by mouth at bedtime.   Yes [provider]  sertraline (ZOLOFT) 50 MG tablet Take 50 mg by mouth at bedtime.  03/14/15  Yes [provider]  vitamin B-12 (CYANOCOBALAMIN) 1000 MCG tablet Take 1,000 mcg by mouth daily.    Yes [provider]    Family History Family History  Problem Relation Age of Onset  . Stroke Father   . Stroke Mother   . Heart attack Sister   . Heart attack Brother   . Stroke Sister     Social History Social History   Tobacco Use  . Smoking status: Former Smoker    Packs/day: 0.50    Years: 25.00    Pack years: 12.50    Types: Cigarettes    Last attempt to quit: 09/13/2010    Years since quitting: 6.9  . Smokeless tobacco: Never Used  Substance Use Topics  . Alcohol use: No    Alcohol/week: 0.0 oz  . Drug use: No     Allergies   Nsaids; Oxycodone hcl; Povidone-iodine;  Prednisone; Amoxicillin-pot clavulanate; Betadine [povidone iodine]; Doxazosin mesylate; Cephalexin; Cymbalta [duloxetine hcl]; Gabapentin; Neomycin; and Tape   Review of Systems Review of Systems  Unable to perform ROS: Mental status change     Physical Exam Updated Vital Signs BP 123/81 (BP Location: Left Arm)   Pulse 82   Temp 99 F (37.2 C) (Rectal)   Resp 18   SpO2 97%   Physical Exam  Constitutional: She appears well-developed and well-nourished. No distress.  HENT:  Head: Normocephalic and atraumatic.  Right Ear: Hearing normal.  Left Ear: Hearing normal.  Nose: Nose normal.  Mouth/Throat: Oropharynx is clear and moist and mucous membranes are normal.  Eyes: Conjunctivae and EOM are normal. Pupils are equal, round, and reactive to light.  Neck: Normal range of motion. Neck supple.  Cardiovascular: Regular rhythm, S1 normal and S2 normal. Exam reveals  no gallop and no friction rub.  No murmur heard. Pulmonary/Chest: Effort normal and breath sounds normal. No respiratory distress. She exhibits no tenderness.  Abdominal: Soft. Normal appearance and bowel sounds are normal. There is no hepatosplenomegaly. There is no tenderness. There is no rebound, no guarding, no tenderness at McBurney's point and negative Murphy's sign. No hernia.  Musculoskeletal: Normal range of motion.  Neurological: She is alert. She has normal strength. No cranial nerve deficit or sensory deficit. Coordination normal. GCS eye subscore is 4. GCS verbal subscore is 5. GCS motor subscore is 6.  Somnolent, awakens to stimulation, nonsensical verbal response to questions  Skin: Skin is warm, dry and intact. No rash noted. No cyanosis.  Psychiatric: Her speech is delayed and slurred. She is slowed.  Nursing note and vitals reviewed.    ED Treatments / Results  Labs (all labs ordered are listed, but only abnormal results are displayed) Labs Reviewed  CBC WITH DIFFERENTIAL/PLATELET - Abnormal; Notable  for the following components:      Result Value   Hemoglobin 10.8 (*)    HCT 34.5 (*)    MCH 25.7 (*)    All other components within normal limits  COMPREHENSIVE METABOLIC PANEL - Abnormal; Notable for the following components:   Potassium 3.2 (*)    Glucose, Bld 126 (*)    Creatinine, Ser 1.19 (*)    GFR calc non Af Amer 41 (*)    GFR calc Af Amer 47 (*)    All other components within normal limits  URINALYSIS, ROUTINE W REFLEX MICROSCOPIC - Abnormal; Notable for the following components:   APPearance CLOUDY (*)    Protein, ur 100 (*)    Leukocytes, UA LARGE (*)    Bacteria, UA FEW (*)    All other components within normal limits  URINE CULTURE  CULTURE, BLOOD (ROUTINE X 2)  CULTURE, BLOOD (ROUTINE X 2)  I-STAT CG4 LACTIC ACID, ED  I-STAT TROPONIN, ED    EKG  EKG Interpretation  Date/Time:  Friday August 08 2017 23:05:18 EST Ventricular Rate:  89 PR Interval:    QRS Duration: 104 QT Interval:  377 QTC Calculation: 459 R Axis:   41 Text Interpretation:  Atrial fibrillation Probable LVH with secondary repol abnrm Probable inferior infarct, age indeterminate Anterior Q waves, possibly due to LVH Confirmed by Gilda Crease [phone]) on 08/08/2017 11:50:54 PM       Radiology Ct Head Wo Contrast  Result Date: 08/09/2017 CLINICAL DATA:  Altered mental status and combativeness. Recent treatment for urinary tract infection. EXAM: CT HEAD WITHOUT CONTRAST TECHNIQUE: Contiguous axial images were obtained from the base of the skull through the vertex without intravenous contrast. COMPARISON:  Head CT and CTA head 12/26/2014 FINDINGS: Brain: Motion artifact limits the examination. There is diffuse cerebral atrophy. Prominent ventricular dilatation consistent with central atrophy. Low-attenuation changes in the deep white matter consistent small vessel ischemia. There is diffuse encephalomalacia throughout the left frontal and parietal lobes, focally in the right frontal lobe,  and throughout the right occipital lobe. This is consistent with multiple old infarcts in the distributions of the left and right middle cerebral artery and in the right posterior cerebral artery. Although infarcts appear chronic, they are new or progressed since the previous study. No mass-effect or midline shift. No abnormal extra-axial fluid collections. Basal cisterns are not effaced. No acute intracranial hemorrhage. Vascular: Intracranial arterial vascular calcifications are present. The basilar artery is dilated and tortuous. Skull: Calvarium appears intact.  No  depressed skull fractures. Sinuses/Orbits: Mucosal thickening in the paranasal sinuses. No acute air-fluid levels. Mastoid air cells are not opacified. Other: None. IMPRESSION: No acute intracranial abnormalities. Old appearing infarcts bilaterally in the middle cerebral artery distribution and in the right posterior cerebral artery distribution. Diffuse atrophy and small vessel ischemic changes. Electronically Signed   By: Burman Nieves M.D.   On: 08/09/2017 00:47   Dg Chest Port 1 View  Result Date: 08/09/2017 CLINICAL DATA:  Altered mental status and combativeness. EXAM: PORTABLE CHEST 1 VIEW COMPARISON:  12/26/2014 FINDINGS: Cardiac pacemaker. Shallow inspiration. Cardiac enlargement. Mild diffuse interstitial pattern to the lungs may represent early interstitial edema. Coronary artery stents. No pleural effusions. No pneumothorax. No focal consolidation. Calcification of the aorta. Degenerative changes in the spine and shoulders. IMPRESSION: Shallow inspiration. Cardiac enlargement. Possible interstitial edema. Aortic atherosclerosis. Electronically Signed   By: Burman Nieves M.D.   On: 08/09/2017 00:48    Procedures Procedures (including critical care time)  Medications Ordered in ED Medications  ciprofloxacin (CIPRO) IVPB 400 mg (not administered)     Initial Impression / Assessment and Plan / ED Course  I have reviewed  the triage vital signs and the nursing notes.  Pertinent labs & imaging results that were available during my care of the patient were reviewed by me and considered in my medical decision making (see chart for details).     She presents to the emergency department for evaluation of acute onset of agitation, combativeness and mental confusion.  Patient reportedly started antibiotic therapy for UTI yesterday, symptoms began today.  Reviewing her MAR reveals that she is on Cipro.  She has allergy to cephalosporins and penicillins.  Patient was reportedly very combative prior to arrival.  She is extremely sedated now secondary to Versed administered by EMS.  Examination is nonfocal.  Head CT unremarkable.  All of her labs are within normal limits other than obviously abnormal urinalysis.  Urine culture pending.  She has not had a urine culture here since 2016, at that time she had pansensitive E. coli.  Patient likely experiencing delirium secondary to acute UTI.  Will require hospitalization secondary to significant mental status changes.  Patient noted to be in atrial fibrillation at arrival.  Past history does not mention atrial fibrillation, however, I was able to access documentation from cardiology in [place].  She has an ICD placed (ischemic cardiomyopathy) and she has parameters for V. tach as well as atrial fibrillation. Further, note from cadiology, Dr. Dawayne Patricia in [place] reports history of paroxysmal A. fib.  She is rate controlled on her Lopressor.  Her current anticoagulation regimen is aspirin and Plavix.  Documentation does not provide a reason for further anticoagulation, as her CHA2DS2/VAS is 8.  Medtronic ICD was interrogated. She has not had an VT or shocks. Rhythm irregularity noted (c/w current AFIB).  CHA2DS2/VAS Stroke Risk Points      8 >= 2 Points: High Risk  1 - 1.99 Points: Medium Risk  0 Points: Low Risk    The patient's score has not changed in the past year.:  No  Change     Details    This score determines the patient's risk of having a stroke if the  patient has atrial fibrillation.       Points Metrics  1 Has Congestive Heart Failure:  Yes   1 Has Vascular Disease:  Yes   1 Has Hypertension:  Yes   2 Age:  89   0 Has Diabetes:  No   2 Had Stroke:  Yes  Had TIA:  No  Had thromboembolism:  No   1 Female:  Yes            Final Clinical Impressions(s) / ED Diagnoses   Final diagnoses:  Delirium  Lower urinary tract infectious disease  Atrial fibrillation, unspecified type [facility])    ED Discharge Orders    None       Gilda Crease, MD 08/09/17 1610    Gilda Crease, MD 08/09/17 0110

## 2017-08-08 NOTE — ED Triage Notes (Signed)
Pt brought in by [facility] for c/o altered mental status and combativeness; staff reported to ems that pt is usually calm, but tonight pt began talking to people that were really not there; pt's cbg 84; pt was given versed 4mg  IM in left hip by ems; pt was recently treated for a UTI

## 2017-08-09 ENCOUNTER — Other Ambulatory Visit: Payer: Self-pay

## 2017-08-09 ENCOUNTER — Encounter (HOSPITAL_COMMUNITY): Payer: Self-pay | Admitting: Family Medicine

## 2017-08-09 DIAGNOSIS — G9340 Encephalopathy, unspecified: Secondary | ICD-10-CM | POA: Diagnosis not present

## 2017-08-09 DIAGNOSIS — N390 Urinary tract infection, site not specified: Secondary | ICD-10-CM | POA: Diagnosis not present

## 2017-08-09 DIAGNOSIS — I480 Paroxysmal atrial fibrillation: Secondary | ICD-10-CM | POA: Diagnosis not present

## 2017-08-09 DIAGNOSIS — N183 Chronic kidney disease, stage 3 unspecified: Secondary | ICD-10-CM | POA: Diagnosis present

## 2017-08-09 DIAGNOSIS — I2510 Atherosclerotic heart disease of native coronary artery without angina pectoris: Secondary | ICD-10-CM | POA: Diagnosis not present

## 2017-08-09 DIAGNOSIS — Z8673 Personal history of transient ischemic attack (TIA), and cerebral infarction without residual deficits: Secondary | ICD-10-CM | POA: Diagnosis not present

## 2017-08-09 DIAGNOSIS — E038 Other specified hypothyroidism: Secondary | ICD-10-CM

## 2017-08-09 DIAGNOSIS — E876 Hypokalemia: Secondary | ICD-10-CM | POA: Diagnosis present

## 2017-08-09 LAB — CBC WITH DIFFERENTIAL/PLATELET
Basophils Absolute: 0 10*3/uL (ref 0.0–0.1)
Basophils Relative: 0 %
Eosinophils Absolute: 0.2 10*3/uL (ref 0.0–0.7)
Eosinophils Relative: 2 %
HEMATOCRIT: 34.5 % — AB (ref 36.0–46.0)
HEMOGLOBIN: 10.7 g/dL — AB (ref 12.0–15.0)
LYMPHS ABS: 1.7 10*3/uL (ref 0.7–4.0)
Lymphocytes Relative: 20 %
MCH: 25.7 pg — AB (ref 26.0–34.0)
MCHC: 31 g/dL (ref 30.0–36.0)
MCV: 82.7 fL (ref 78.0–100.0)
MONO ABS: 0.9 10*3/uL (ref 0.1–1.0)
MONOS PCT: 11 %
NEUTROS ABS: 5.6 10*3/uL (ref 1.7–7.7)
NEUTROS PCT: 67 %
Platelets: 221 10*3/uL (ref 150–400)
RBC: 4.17 MIL/uL (ref 3.87–5.11)
RDW: 15.6 % — AB (ref 11.5–15.5)
WBC: 8.4 10*3/uL (ref 4.0–10.5)

## 2017-08-09 LAB — COMPREHENSIVE METABOLIC PANEL
ALK PHOS: 73 U/L (ref 38–126)
ALT: 23 U/L (ref 14–54)
AST: 27 U/L (ref 15–41)
Albumin: 3.5 g/dL (ref 3.5–5.0)
Anion gap: 10 (ref 5–15)
BILIRUBIN TOTAL: 0.7 mg/dL (ref 0.3–1.2)
BUN: 20 mg/dL (ref 6–20)
CO2: 27 mmol/L (ref 22–32)
CREATININE: 1.19 mg/dL — AB (ref 0.44–1.00)
Calcium: 9.3 mg/dL (ref 8.9–10.3)
Chloride: 106 mmol/L (ref 101–111)
GFR calc Af Amer: 47 mL/min — ABNORMAL LOW (ref >60)
GFR, EST NON AFRICAN AMERICAN: 41 mL/min — AB (ref >60)
Glucose, Bld: 126 mg/dL — ABNORMAL HIGH (ref 65–99)
POTASSIUM: 3.2 mmol/L — AB (ref 3.5–5.1)
Sodium: 143 mmol/L (ref 135–145)
Total Protein: 6.7 g/dL (ref 6.5–8.1)

## 2017-08-09 LAB — URINALYSIS, ROUTINE W REFLEX MICROSCOPIC
Bilirubin Urine: NEGATIVE
Glucose, UA: NEGATIVE mg/dL
Hgb urine dipstick: NEGATIVE
Ketones, ur: NEGATIVE mg/dL
Nitrite: NEGATIVE
PH: 7 (ref 5.0–8.0)
Protein, ur: 100 mg/dL — AB
SPECIFIC GRAVITY, URINE: 1.019 (ref 1.005–1.030)
Squamous Epithelial / LPF: NONE SEEN

## 2017-08-09 LAB — MRSA PCR SCREENING: MRSA by PCR: NEGATIVE

## 2017-08-09 LAB — GLUCOSE, CAPILLARY: GLUCOSE-CAPILLARY: 94 mg/dL (ref 65–99)

## 2017-08-09 LAB — AMMONIA: Ammonia: 14 umol/L (ref 9–35)

## 2017-08-09 LAB — BASIC METABOLIC PANEL
ANION GAP: 11 (ref 5–15)
BUN: 16 mg/dL (ref 6–20)
CO2: 25 mmol/L (ref 22–32)
Calcium: 8.9 mg/dL (ref 8.9–10.3)
Chloride: 105 mmol/L (ref 101–111)
Creatinine, Ser: 1.03 mg/dL — ABNORMAL HIGH (ref 0.44–1.00)
GFR calc Af Amer: 56 mL/min — ABNORMAL LOW (ref >60)
GFR calc non Af Amer: 49 mL/min — ABNORMAL LOW (ref >60)
GLUCOSE: 109 mg/dL — AB (ref 65–99)
POTASSIUM: 3.3 mmol/L — AB (ref 3.5–5.1)
Sodium: 141 mmol/L (ref 135–145)

## 2017-08-09 LAB — MAGNESIUM: Magnesium: 2.5 mg/dL — ABNORMAL HIGH (ref 1.7–2.4)

## 2017-08-09 LAB — VITAMIN B12: Vitamin B-12: 1146 pg/mL — ABNORMAL HIGH (ref 180–914)

## 2017-08-09 LAB — TSH: TSH: 3.826 u[IU]/mL (ref 0.350–4.500)

## 2017-08-09 MED ORDER — DOCUSATE SODIUM 100 MG PO CAPS
100.0000 mg | ORAL_CAPSULE | Freq: Every day | ORAL | Status: DC
  Administered 2017-08-10 – 2017-08-14 (×4): 100 mg via ORAL
  Filled 2017-08-09 (×5): qty 1

## 2017-08-09 MED ORDER — VITAMIN B-12 1000 MCG PO TABS
1000.0000 ug | ORAL_TABLET | Freq: Every day | ORAL | Status: DC
  Administered 2017-08-10 – 2017-08-14 (×4): 1000 ug via ORAL
  Filled 2017-08-09 (×5): qty 1

## 2017-08-09 MED ORDER — CLOPIDOGREL BISULFATE 75 MG PO TABS
75.0000 mg | ORAL_TABLET | Freq: Every day | ORAL | Status: DC
  Administered 2017-08-11: 75 mg via ORAL
  Filled 2017-08-09 (×2): qty 1

## 2017-08-09 MED ORDER — HALOPERIDOL LACTATE 5 MG/ML IJ SOLN: 1.0000 mg | Freq: Four times a day (QID) | INTRAMUSCULAR | Status: DC | PRN

## 2017-08-09 MED ORDER — SENNOSIDES-DOCUSATE SODIUM 8.6-50 MG PO TABS: 1.0000 | ORAL_TABLET | Freq: Every evening | ORAL | Status: DC | PRN

## 2017-08-09 MED ORDER — POTASSIUM CHLORIDE CRYS ER 20 MEQ PO TBCR
20.0000 meq | EXTENDED_RELEASE_TABLET | Freq: Every day | ORAL | Status: DC
  Administered 2017-08-10 – 2017-08-14 (×4): 20 meq via ORAL
  Filled 2017-08-09 (×5): qty 1

## 2017-08-09 MED ORDER — QUETIAPINE FUMARATE 25 MG PO TABS
25.0000 mg | ORAL_TABLET | Freq: Every day | ORAL | Status: DC
  Administered 2017-08-10 – 2017-08-11 (×2): 25 mg via ORAL
  Filled 2017-08-09 (×2): qty 1

## 2017-08-09 MED ORDER — LORAZEPAM 2 MG/ML IJ SOLN
0.5000 mg | INTRAMUSCULAR | Status: DC | PRN
  Administered 2017-08-09: 0.5 mg via INTRAVENOUS
  Filled 2017-08-09: qty 1

## 2017-08-09 MED ORDER — LORAZEPAM 2 MG/ML IJ SOLN: 1.0000 mg | Freq: Four times a day (QID) | INTRAMUSCULAR | Status: DC | PRN

## 2017-08-09 MED ORDER — LEVOTHYROXINE SODIUM 50 MCG PO TABS
100.0000 ug | ORAL_TABLET | Freq: Every day | ORAL | Status: DC
  Administered 2017-08-10 – 2017-08-14 (×5): 100 ug via ORAL
  Filled 2017-08-09 (×5): qty 2

## 2017-08-09 MED ORDER — METOPROLOL TARTRATE 25 MG PO TABS: 25.0000 mg | ORAL_TABLET | Freq: Two times a day (BID) | ORAL | Status: DC

## 2017-08-09 MED ORDER — LORAZEPAM 2 MG/ML IJ SOLN
0.5000 mg | INTRAMUSCULAR | Status: DC | PRN
  Administered 2017-08-10: 0.5 mg via INTRAVENOUS
  Filled 2017-08-09: qty 1

## 2017-08-09 MED ORDER — METOPROLOL TARTRATE 5 MG/5ML IV SOLN
2.5000 mg | Freq: Four times a day (QID) | INTRAVENOUS | Status: DC
  Administered 2017-08-09 – 2017-08-10 (×3): 2.5 mg via INTRAVENOUS
  Filled 2017-08-09 (×4): qty 5

## 2017-08-09 MED ORDER — ASPIRIN 81 MG PO CHEW
81.0000 mg | CHEWABLE_TABLET | Freq: Every day | ORAL | Status: DC
  Administered 2017-08-10 – 2017-08-14 (×4): 81 mg via ORAL
  Filled 2017-08-09 (×5): qty 1

## 2017-08-09 MED ORDER — VITAMIN D 1000 UNITS PO TABS
2000.0000 [IU] | ORAL_TABLET | Freq: Every day | ORAL | Status: DC
  Administered 2017-08-10 – 2017-08-14 (×4): 2000 [IU] via ORAL
  Filled 2017-08-09 (×5): qty 2

## 2017-08-09 MED ORDER — SERTRALINE HCL 50 MG PO TABS
50.0000 mg | ORAL_TABLET | Freq: Every day | ORAL | Status: DC
  Administered 2017-08-11: 50 mg via ORAL
  Filled 2017-08-09: qty 1

## 2017-08-09 MED ORDER — ONDANSETRON HCL 4 MG PO TABS: 4.0000 mg | ORAL_TABLET | Freq: Four times a day (QID) | ORAL | Status: DC | PRN

## 2017-08-09 MED ORDER — ONDANSETRON HCL 4 MG/2ML IJ SOLN: 4.0000 mg | Freq: Four times a day (QID) | INTRAMUSCULAR | Status: DC | PRN

## 2017-08-09 MED ORDER — MAGNESIUM SULFATE 2 GM/50ML IV SOLN
2.0000 g | Freq: Once | INTRAVENOUS | Status: AC
  Administered 2017-08-09: 2 g via INTRAVENOUS
  Filled 2017-08-09: qty 50

## 2017-08-09 MED ORDER — CIPROFLOXACIN IN D5W 400 MG/200ML IV SOLN
400.0000 mg | INTRAVENOUS | Status: DC
  Administered 2017-08-09 – 2017-08-10 (×2): 400 mg via INTRAVENOUS
  Filled 2017-08-09 (×2): qty 200

## 2017-08-09 MED ORDER — POTASSIUM CHLORIDE 10 MEQ/100ML IV SOLN
10.0000 meq | INTRAVENOUS | Status: AC
  Administered 2017-08-09 (×2): 10 meq via INTRAVENOUS
  Filled 2017-08-09 (×2): qty 100

## 2017-08-09 MED ORDER — ATORVASTATIN CALCIUM 40 MG PO TABS
40.0000 mg | ORAL_TABLET | Freq: Every day | ORAL | Status: DC
  Administered 2017-08-10 – 2017-08-13 (×3): 40 mg via ORAL
  Filled 2017-08-09 (×5): qty 1

## 2017-08-09 MED ORDER — ENOXAPARIN SODIUM 40 MG/0.4ML ~~LOC~~ SOLN
40.0000 mg | SUBCUTANEOUS | Status: DC
  Administered 2017-08-09 – 2017-08-10 (×2): 40 mg via SUBCUTANEOUS
  Filled 2017-08-09 (×3): qty 0.4

## 2017-08-09 MED ORDER — ACETAMINOPHEN 650 MG RE SUPP: 650.0000 mg | Freq: Four times a day (QID) | RECTAL | Status: DC | PRN

## 2017-08-09 MED ORDER — BISACODYL 5 MG PO TBEC: 5.0000 mg | DELAYED_RELEASE_TABLET | Freq: Every day | ORAL | Status: DC | PRN

## 2017-08-09 MED ORDER — ACETAMINOPHEN 325 MG PO TABS
650.0000 mg | ORAL_TABLET | Freq: Four times a day (QID) | ORAL | Status: DC | PRN
  Administered 2017-08-10 – 2017-08-12 (×2): 650 mg via ORAL
  Filled 2017-08-09 (×2): qty 2

## 2017-08-09 MED ORDER — HALOPERIDOL LACTATE 5 MG/ML IJ SOLN
2.0000 mg | Freq: Four times a day (QID) | INTRAMUSCULAR | Status: DC | PRN
  Administered 2017-08-09: 2 mg via INTRAMUSCULAR
  Filled 2017-08-09: qty 1

## 2017-08-09 MED ORDER — MISOPROSTOL 100 MCG PO TABS
100.0000 ug | ORAL_TABLET | Freq: Two times a day (BID) | ORAL | Status: DC
  Administered 2017-08-10 – 2017-08-14 (×5): 100 ug via ORAL
  Filled 2017-08-09 (×13): qty 1

## 2017-08-09 MED ORDER — HALOPERIDOL LACTATE 5 MG/ML IJ SOLN
5.0000 mg | Freq: Four times a day (QID) | INTRAMUSCULAR | Status: DC | PRN
  Administered 2017-08-12 – 2017-08-14 (×3): 5 mg via INTRAMUSCULAR
  Filled 2017-08-09 (×3): qty 1

## 2017-08-09 MED ORDER — CIPROFLOXACIN IN D5W 400 MG/200ML IV SOLN
400.0000 mg | Freq: Once | INTRAVENOUS | Status: AC
  Administered 2017-08-09: 400 mg via INTRAVENOUS
  Filled 2017-08-09: qty 200

## 2017-08-09 NOTE — Progress Notes (Signed)
Patient seen and evaluated, chart reviewed, please see EMR for updated orders. Please see full H&P dictated by admitting physician Dr Antionette Charpyd for same date of service.    Encephalopathy secondary to presumed UTI-patient with multiple antibiotic allergies, continue IV Cipro 400 mg every 24 hours renally adjusted pending culture data  May use lorazepam or Haldol as needed agitation and behavioral disturbance  Exam reveals left subclavian pacemaker/AICD, and Foley catheter in situ with clear urine   Patient seen and evaluated, chart reviewed, please see EMR for updated orders. Please see full H&P dictated by admitting physician Dr Antionette Charpyd for same date of service.    Shon Haleourage Mercy Leppla, MD

## 2017-08-09 NOTE — Progress Notes (Signed)
PT extremely combative, swatting, hitting and spitting on staff. Medication given to help patient relax and get rest. Placed mask on PT to shield staff from spit and biting. Placed safety mitts on patient to protect lines and staff from physical wounding. PT high fall risk and placed on fall precautions. Fall mats in place. Pt had sitter in room with her at all times. PT refused PO fluids. All rails of bed in upward locked position per daughter's, POA, request for safety and prevention of falls. Continue to monitor and keep PT safe and free of injury.

## 2017-08-09 NOTE — H&P (Signed)
History and Physical    AMILIAH CAMPISI Dunlap:[id number] DOB: September 24, 1930 DOA: 08/08/2017  PCP: Richardean Chimera, MD   Patient coming from: SNF   Chief Complaint: Increased confusion, agitation, refusing food and medicines   HPI: [name] is a 84 y.o. female with medical history significant for history of CVA, coronary artery disease, history of ischemic cardiomyopathy with ICD, hypothyroidism, and paroxysmal atrial fibrillation not anticoagulated, now presenting to the emergency department from her SNF for evaluation of increased confusion, agitation, and combativeness.  Patient is accompanied by her daughter who provides most of the history.  She had reportedly been treated for a UTI recently at the nursing facility with [facility], had persistent symptoms, and was prescribed ciprofloxacin yesterday.  She has been refusing medications and past couple days, increasingly confused, and then became agitated and combative tonight, requiring sedation with IM Versed given by EMS.  She has not had any fevers documented and has now been expressing any specific complaints.  There was no recent fall or trauma reported and she is not known to use illicit substances.  ED Course: Upon arrival to the ED, patient is found to be afebrile, saturating well on room air, and with vitals otherwise normal.  EKG features atrial fibrillation with LVH and secondary repolarization abnormality.  Chest x-ray is notable for shallow inspiration, cardiomegaly, question of interstitial edema.  Noncontrast head CT is negative for acute intracranial abnormality, but notable for old strokes.  Panel reveals a potassium of 3.2 and creatinine 1.19.  CBC is notable for a normocytic anemia with hemoglobin of 10.8.  Urinalysis suggests infection.  Lactic acid is reassuring at 0.76 and troponin is within the normal limits.  Blood and urine cultures were collected in the ED and the patient was treated with IV ciprofloxacin.  When he stable, in  no apparent respiratory distress, and will be observed on the medical-surgical unit for ongoing evaluation and management of acute encephalopathy suspected secondary to UTI.  Review of Systems:  All other systems reviewed and apart from HPI, are negative.  Past Medical History:  Diagnosis Date  . Automatic implantable cardiac defibrillator in situ    Medtronic  . B12 deficiency   . Coronary atherosclerosis of native coronary artery    Stent to OM3, DES D1 and RCA 3/12  . Depression   . Essential hypertension, benign   . Hyperlipidemia   . Hypothyroidism   . Ischemic cardiomyopathy    LVEF 35-40%  . Myocardial infarction [facility])    May 2006, March 2012  . Osteopenia   . Spinal stenosis    Chronic back pain    Past Surgical History:  Procedure Laterality Date  . ABDOMINAL HYSTERECTOMY  1971  . APPENDECTOMY  1956  . CARDIAC DEFIBRILLATOR PLACEMENT     Medtronic  . CATARACT EXTRACTION W/PHACO Left 10/04/2013   Procedure: CATARACT EXTRACTION PHACO AND INTRAOCULAR LENS PLACEMENT (IOC);  Surgeon: Gemma Payor, MD;  Location: [facility];  Service: Ophthalmology;  Laterality: Left;  CDE 9.37  . CATARACT EXTRACTION W/PHACO Right 10/25/2013   Procedure: CATARACT EXTRACTION PHACO AND INTRAOCULAR LENS PLACEMENT (IOC);  Surgeon: Gemma Payor, MD;  Location: [facility];  Service: Ophthalmology;  Laterality: Right;  CDE 8.57  . IMPLANTABLE CARDIOVERTER DEFIBRILLATOR (ICD) GENERATOR CHANGE N/A 04/22/2014   Procedure: ICD GENERATOR CHANGE;  Surgeon: Marinus Maw, MD;  Location: [facility] C[facility] LAB;  Service: Cardiovascular;  Laterality: N/A;  . JOINT REPLACEMENT    . REPLACEMENT TOTAL KNEE Bilateral  Right-April 2006, Left-April 2011  . TONSILLECTOMY  1952     reports that she quit smoking about 6 years ago. Her smoking use included cigarettes. She has a 12.50 pack-year smoking history. she has never used smokeless tobacco. She reports that she does not drink alcohol or use drugs.  Allergies  Allergen  Reactions  . Nsaids Other (See Comments)    Vomited blood   . Oxycodone Hcl Hives and Rash  . Povidone-Iodine Rash  . Prednisone Nausea Only and Other (See Comments)    FLU SYMPTOMS  . Amoxicillin-Pot Clavulanate Other (See Comments)    VOMITING  . Betadine [Povidone Iodine] Rash    rash  . Doxazosin Mesylate Other (See Comments)    INCONTINENCE   . Cephalexin Rash  . Cymbalta [Duloxetine Hcl] Nausea Only and Other (See Comments)    Headache  . Gabapentin Rash  . Neomycin Rash  . Tape Other (See Comments)    Redness    Family History  Problem Relation Age of Onset  . Stroke Father   . Stroke Mother   . Heart attack Sister   . Heart attack Brother   . Stroke Sister      Prior to Admission medications   Medication Sig Start Date End Date Taking? Authorizing Provider  acetaminophen (TYLENOL) 650 MG CR tablet Take 650 mg by mouth every 8 (eight) hours as needed for pain.    Yes [provider]  aspirin 81 MG chewable tablet Chew 81 mg by mouth daily.   Yes [provider]  atorvastatin (LIPITOR) 40 MG tablet Take 40 mg by mouth at bedtime.    Yes [provider]  Cholecalciferol (VITAMIN D) 2000 UNITS CAPS Take 2,000 Units by mouth daily.   Yes [provider]  ciprofloxacin (CIPRO) 250 MG tablet Take 250 mg by mouth 2 (two) times daily. 7 day course starting on 08/07/2017   Yes [provider]  clopidogrel (PLAVIX) 75 MG tablet Take 75 mg by mouth at bedtime.  03/14/15  Yes [provider]  docusate sodium (COLACE) 100 MG capsule Take 100 mg by mouth daily.   Yes [provider]  levothyroxine (SYNTHROID) 100 MCG tablet Take 100 mcg by mouth daily before breakfast.  03/17/15 08/08/17 Yes [provider]  Melatonin 10 MG TABS Take 10 mg by mouth every evening.   Yes [provider]  metoprolol tartrate (LOPRESSOR) 25 MG tablet Take 25 mg by mouth 2 (two) times daily.   Yes [provider]    misoprostol (CYTOTEC) 100 MCG tablet Take 100 mcg by mouth 2 (two) times daily.   Yes [provider]  potassium chloride (K-DUR,KLOR-CON) 10 MEQ tablet Take 10 mEq by mouth daily.  03/16/15  Yes [provider]  risperiDONE (RISPERDAL) 1 MG tablet Take 1 mg by mouth at bedtime.   Yes [provider]  sertraline (ZOLOFT) 50 MG tablet Take 50 mg by mouth at bedtime.  03/14/15  Yes [provider]  vitamin B-12 (CYANOCOBALAMIN) 1000 MCG tablet Take 1,000 mcg by mouth daily.    Yes [provider]    Physical Exam: Vitals:   08/09/17 0100 08/09/17 0115 08/09/17 0130 08/09/17 0145  BP:      Pulse:      Resp: (!) 27 (!) 21 15 (!) 27  Temp:      TempSrc:      SpO2:          Constitutional: NAD, confused, agitated  Eyes:  PERTLA, lids and conjunctivae normal ENMT: Mucous membranes are moist. Posterior pharynx clear of any exudate or lesions.   Neck: normal, supple, no masses, no thyromegaly Respiratory: clear to auscultation bilaterally, no wheezing, no crackles. Normal respiratory effort.    Cardiovascular: Rate ~80 and irregular. No extremity edema. No significant JVD. Abdomen: No distension. Refused palpation and auscultation.   Musculoskeletal: no clubbing / cyanosis. No joint deformity upper and lower extremities.    Skin: no significant rashes, lesions, ulcers. Warm, dry, well-perfused. Neurologic: Aphasia. Patellar DTR's normal. Moving all extremities spontaneously.  Psychiatric: Alert. Disoriented, combative.      Labs on Admission: I have personally reviewed following labs and imaging studies  CBC: Recent Labs  Lab 08/08/17 2322  WBC 8.7  NEUTROABS 5.8  HGB 10.8*  HCT 34.5*  MCV 82.1  PLT 225   Basic Metabolic Panel: Recent Labs  Lab 08/08/17 2322  NA 143  K 3.2*  CL 106  CO2 27  GLUCOSE 126*  BUN 20  CREATININE 1.19*  CALCIUM 9.3   GFR: CrCl cannot be calculated (Unknown ideal weight.). Liver Function  Tests: Recent Labs  Lab 08/08/17 2322  AST 27  ALT 23  ALKPHOS 73  BILITOT 0.7  PROT 6.7  ALBUMIN 3.5   No results for input(s): LIPASE, AMYLASE in the last 168 hours. No results for input(s): AMMONIA in the last 168 hours. Coagulation Profile: No results for input(s): INR, PROTIME in the last 168 hours. Cardiac Enzymes: No results for input(s): CKTOTAL, CKMB, CKMBINDEX, TROPONINI in the last 168 hours. BNP (last 3 results) No results for input(s): PROBNP in the last 8760 hours. HbA1C: No results for input(s): HGBA1C in the last 72 hours. CBG: No results for input(s): GLUCAP in the last 168 hours. Lipid Profile: No results for input(s): CHOL, HDL, LDLCALC, TRIG, CHOLHDL, LDLDIRECT in the last 72 hours. Thyroid Function Tests: No results for input(s): TSH, T4TOTAL, FREET4, T3FREE, THYROIDAB in the last 72 hours. Anemia Panel: No results for input(s): VITAMINB12, FOLATE, FERRITIN, TIBC, IRON, RETICCTPCT in the last 72 hours. Urine analysis:    Component Value Date/Time   COLORURINE YELLOW 08/08/2017 2350   APPEARANCEUR CLOUDY (A) 08/08/2017 2350   LABSPEC 1.019 08/08/2017 2350   PHURINE 7.0 08/08/2017 2350   GLUCOSEU NEGATIVE 08/08/2017 2350   HGBUR NEGATIVE 08/08/2017 2350   BILIRUBINUR NEGATIVE 08/08/2017 2350   KETONESUR NEGATIVE 08/08/2017 2350   PROTEINUR 100 (A) 08/08/2017 2350   UROBILINOGEN 1.0 12/30/2014 1930   NITRITE NEGATIVE 08/08/2017 2350   LEUKOCYTESUR LARGE (A) 08/08/2017 2350   Sepsis Labs: @LABRCNTIP (procalcitonin:4,lacticidven:4) )No results found for this or any previous visit (from the past 240 hour(s)).   Radiological Exams on Admission: Ct Head Wo Contrast  Result Date: 08/09/2017 CLINICAL DATA:  Altered mental status and combativeness. Recent treatment for urinary tract infection. EXAM: CT HEAD WITHOUT CONTRAST TECHNIQUE: Contiguous axial images were obtained from the base of the skull through the vertex without intravenous contrast.  COMPARISON:  Head CT and CTA head 12/26/2014 FINDINGS: Brain: Motion artifact limits the examination. There is diffuse cerebral atrophy. Prominent ventricular dilatation consistent with central atrophy. Low-attenuation changes in the deep white matter consistent small vessel ischemia. There is diffuse encephalomalacia throughout the left frontal and parietal lobes, focally in the right frontal lobe, and throughout the right occipital lobe. This is consistent with multiple old infarcts in the distributions of the left and right middle cerebral artery and in the right posterior cerebral artery. Although infarcts appear chronic, they are  new or progressed since the previous study. No mass-effect or midline shift. No abnormal extra-axial fluid collections. Basal cisterns are not effaced. No acute intracranial hemorrhage. Vascular: Intracranial arterial vascular calcifications are present. The basilar artery is dilated and tortuous. Skull: Calvarium appears intact.  No depressed skull fractures. Sinuses/Orbits: Mucosal thickening in the paranasal sinuses. No acute air-fluid levels. Mastoid air cells are not opacified. Other: None. IMPRESSION: No acute intracranial abnormalities. Old appearing infarcts bilaterally in the middle cerebral artery distribution and in the right posterior cerebral artery distribution. Diffuse atrophy and small vessel ischemic changes. Electronically Signed   By: Burman Nieves M.D.   On: 08/09/2017 00:47   Dg Chest Port 1 View  Result Date: 08/09/2017 CLINICAL DATA:  Altered mental status and combativeness. EXAM: PORTABLE CHEST 1 VIEW COMPARISON:  12/26/2014 FINDINGS: Cardiac pacemaker. Shallow inspiration. Cardiac enlargement. Mild diffuse interstitial pattern to the lungs may represent early interstitial edema. Coronary artery stents. No pleural effusions. No pneumothorax. No focal consolidation. Calcification of the aorta. Degenerative changes in the spine and shoulders. IMPRESSION:  Shallow inspiration. Cardiac enlargement. Possible interstitial edema. Aortic atherosclerosis. Electronically Signed   By: Burman Nieves M.D.   On: 08/09/2017 00:48    EKG: Independently reviewed. Atrial fibrillation, LVH with repolarization abnormality.   Assessment/Plan  1. Acute encephalopathy  - Presents with increased confusion, agitation, refusing food and medications  - Head CT negative for acute findings and no acute focal deficit identified  - Suspected secondary to UTI, addressed below  - Treat UTI, check TSH, ammonia, B12, folate, and RPR   2. UTI  - Completed a course of Macrobid recently, has persistent sxs and was started on Cipro yesterday but she has been refusing medications  - UA is consistent with persistent infection  - Blood and urine cultures collected in ED and IV Cipro given  - Continue ciprofloxacin pending culture data    3. Paroxysmal atrial fibrillation  - In rate-controlled a fib on admission  - CHADS-VASc is 16 (age x2, gender, CHF, CAD, HTN, CVA x2)  - Follows with cardiology, not on anticoagulation  - Continue Lopressor, ASA, Plavix    4. CAD  - No anginal complaints - Continue ASA, Plavix, Lipitor, Lopressor    5. Hx of CVA  - Head CT is negative for acute intracranial abnormality  - Aphasia is chronic since prior CVA per daughter, no acute focal deficits identified  - Continue ASA, Plavix, statin    6. Ischemic cardiomyopathy  - Appears to be well-compensated  - EF 60-65% on most recent echo from 2016  - Follow I/O's and daily wts   7. Hypothyroidism  - Check TSH given her presentation  - Continue Synthroid    DVT prophylaxis: Lovenox Code Status: DNR Family Communication: Daughter updated at bedside Disposition Plan: Admit to med-surg  Consults called: none Admission status: Inpatient    Briscoe Deutscher, MD [facility] Pager [phone]  If 7PM-7AM, please contact night-coverage [email or web address] Password  [email or web address]  08/09/2017, 1:50 AM

## 2017-08-09 NOTE — Progress Notes (Signed)
Pharmacy Antibiotic Note  Donnie Ahoatty S Brecheen is a 84 y.o. female admitted on 08/08/2017 with UTI.  Pharmacy has been consulted for CIPRO dosing.  Plan: Cipro 400mg  IV q24hrs (renally adjusted) Monitor labs, progress, cultures  Antimicrobials this admission: Cipro 1/25 >>   Height: 5' (152.4 cm) Weight: 137 lb 9.1 oz (62.4 kg) IBW/kg (Calculated) : 45.5  Temp (24hrs), Avg:98.4 F (36.9 C), Min:97.9 F (36.6 C), Max:99 F (37.2 C)  Recent Labs  Lab 08/08/17 2322 08/08/17 2343 08/09/17 0733  WBC 8.7  --  8.4  CREATININE 1.19*  --  1.03*  LATICACIDVEN  --  0.76  --     Estimated Creatinine Clearance: 33.6 mL/min (A) (by C-G formula based on SCr of 1.03 mg/dL (H)).    Allergies  Allergen Reactions  . Nsaids Other (See Comments)    Vomited blood   . Oxycodone Hcl Hives and Rash  . Povidone-Iodine Rash  . Prednisone Nausea Only and Other (See Comments)    FLU SYMPTOMS  . Amoxicillin-Pot Clavulanate Other (See Comments)    VOMITING  . Betadine [Povidone Iodine] Rash    rash  . Doxazosin Mesylate Other (See Comments)    INCONTINENCE   . Cephalexin Rash  . Cymbalta [Duloxetine Hcl] Nausea Only and Other (See Comments)    Headache  . Gabapentin Rash  . Neomycin Rash  . Tape Other (See Comments)    Redness   Dose adjustments this admission:  Microbiology results:  BCx:   UCx: pending   Sputum:    MRSA PCR:   Thank you for allowing pharmacy to be a part of this patient's care.  Valrie HartHall, Monaye Blackie A 08/09/2017 9:58 AM

## 2017-08-10 DIAGNOSIS — G9340 Encephalopathy, unspecified: Secondary | ICD-10-CM | POA: Diagnosis not present

## 2017-08-10 DIAGNOSIS — A498 Other bacterial infections of unspecified site: Secondary | ICD-10-CM | POA: Diagnosis present

## 2017-08-10 DIAGNOSIS — I4891 Unspecified atrial fibrillation: Secondary | ICD-10-CM | POA: Diagnosis not present

## 2017-08-10 DIAGNOSIS — N390 Urinary tract infection, site not specified: Secondary | ICD-10-CM | POA: Diagnosis not present

## 2017-08-10 LAB — GLUCOSE, CAPILLARY: GLUCOSE-CAPILLARY: 91 mg/dL (ref 65–99)

## 2017-08-10 LAB — RPR: RPR Ser Ql: NONREACTIVE

## 2017-08-10 LAB — HIV ANTIBODY (ROUTINE TESTING W REFLEX): HIV SCREEN 4TH GENERATION: NONREACTIVE

## 2017-08-10 MED ORDER — POTASSIUM CHLORIDE CRYS ER 20 MEQ PO TBCR
40.0000 meq | EXTENDED_RELEASE_TABLET | Freq: Once | ORAL | Status: AC
  Administered 2017-08-10: 40 meq via ORAL
  Filled 2017-08-10: qty 4

## 2017-08-10 MED ORDER — HYDRALAZINE HCL 20 MG/ML IJ SOLN
10.0000 mg | INTRAMUSCULAR | Status: DC | PRN
  Administered 2017-08-10: 10 mg via INTRAVENOUS
  Filled 2017-08-10: qty 1

## 2017-08-10 MED ORDER — METOPROLOL TARTRATE 25 MG PO TABS
25.0000 mg | ORAL_TABLET | Freq: Two times a day (BID) | ORAL | Status: DC
  Administered 2017-08-10 – 2017-08-14 (×5): 25 mg via ORAL
  Filled 2017-08-10 (×7): qty 1

## 2017-08-10 NOTE — Progress Notes (Signed)
Patient Demographics:    [name], is a 84 y.o. female, DOB - [birth year], ZOX:[id number]  Admit date - 08/08/2017   Admitting Physician Briscoe Deutscher, MD  Outpatient Primary MD for the patient is Richardean Chimera, MD  LOS - 0   Chief Complaint  Patient presents with  . Altered Mental Status        Subjective:    [employer] today has no fevers, no emesis,  No chest pain, her daughter as well as patient's CNA from her assisted living facility at bedside, patient is more, more cooperative apparently at baseline she is usually functional and gets around with a walker  Assessment  & Plan :    Principal Problem:   Toxic Acute encephalopathy due to UTI Active Problems:   Essential hypertension, benign   Automatic implantable cardioverter-defibrillator in situ   Hypothyroidism   CAD in native artery   Cerebrovascular accident, old   AF (paroxysmal atrial fibrillation) (HCC)   Acute lower UTI   Hypokalemia   CKD (chronic kidney disease), stage III (HCC)   Proteus mirabilis infection/UTI  Brief Summary:- 84 y.o. female with medical history significant for history of recurrent CVA, CAD, H/o ischemic cardiomyopathy with ICD, hypothyroidism, and PAF not anticoagulated, now presenting to the emergency department from her ALF for evaluation of increased confusion, agitation, and combativeness.  Workup reveals old strokes but no new acute stroke, patient does have Proteus UTI final sensitivities are pending   Plan:- 1)Toxic Encephalopathy secondary to Proteus UTI-   clinically appears to be improving, less confused less agitated less combative, patient with multiple antibiotic allergies, continue IV Cipro 400 mg every 24 hours renally adjusted pending final sensitivities of the proteus mirabilis in the urine, May use lorazepam or Haldol as needed agitation and behavioral disturbance.  Seroquel 25 mg  every evening, patient recently completed Macrobid.  Lactic acid was not elevated  2)H/o Recurrent CVAs-suspect some component of vascular/multi-infarct dementia, now with superimposed encephalopathy secondary to proteus UTI, CT head with multiple old strokes and significant cerebral atrophy, unable to do MRI brain due to AICD in situ, and MRI findings probably would not change management, no aspirin 81 mg daily, Plavix 75 mg daily and atorvastatin 40 mg daily .  Patient was previously told that she was not a candidate for full anticoagulation.  At baseline patient ambulates with a walker and she has some aphasia from prior stroke  3)H/o CAD/ischemic Cardiomyopathy-last known EF over 60%, no ACS type symptoms, troponin was negative on admission, continue metoprolol 25 mg twice daily, Lipitor 40 mg daily, aspirin 81 mg daily and Plavix 75 mg daily  4) Paroxysmal atrial fibrillation - - continue metoprolol 25 mg daily for rate control,  CHADS-VASc is 23 (age x2, gender, CHF, CAD, HTN, CVA x2) , apparently not a candidate for anticoagulation, Follows with cardiology, continue aspirin and Plavix for secondary stroke prophylaxis  5) Hypothyroidism -continue levothyroxine 100 mcg daily, TSH is currently 3.8  6)Dementia/Confusion-RPR is nonreactive, ammonia is not elevated, serum B12 is not low, TSH is 3.8, overall patient most likely has multi-infarct/vascular dementia at baseline with superimposed confusion and agitation secondary to toxic encephalopathy in the setting of potential UTI as noted above #1  7)Social/Ethics- Plan of  care d/w  her daughter as well as patient's CNA from her assisted living facility at bedside, patient is usually more cooperative apparently at baseline,  she is usually functional and gets around with a walker, she is a DNR/DNI  8)Disposition-possible discharge back to ALF/group home when proteus Mirabilis antibiotic sensitivities are back (patient is allergic to penicillins and  cephalosporins, currently on Cipro, completed Macrobid prior to admission)  DVT Prophylaxis  :  Lovenox  Lab Results  Component Value Date   PLT 221 08/09/2017    Inpatient Medications  Scheduled Meds: . aspirin  81 mg Oral Daily  . atorvastatin  40 mg Oral q1800  . cholecalciferol  2,000 Units Oral Daily  . clopidogrel  75 mg Oral QHS  . docusate sodium  100 mg Oral Daily  . enoxaparin (LOVENOX) injection  40 mg Subcutaneous Q24H  . levothyroxine  100 mcg Oral QAC breakfast  . metoprolol tartrate  25 mg Oral BID  . misoprostol  100 mcg Oral BID  . potassium chloride  20 mEq Oral Daily  . QUEtiapine  25 mg Oral Q supper  . sertraline  50 mg Oral QHS  . vitamin B-12  1,000 mcg Oral Daily   Continuous Infusions: . ciprofloxacin Stopped (08/09/17 2116)   PRN Meds:.acetaminophen **OR** acetaminophen, bisacodyl, haloperidol lactate, hydrALAZINE, LORazepam, ondansetron **OR** ondansetron (ZOFRAN) IV, senna-docusate    Anti-infectives (From admission, onward)   Start     Dose/Rate Route Frequency Ordered Stop   08/09/17 2100  ciprofloxacin (CIPRO) IVPB 400 mg     400 mg 200 mL/hr over 60 Minutes Intravenous Every 24 hours 08/09/17 0735     08/09/17 0115  ciprofloxacin (CIPRO) IVPB 400 mg     400 mg 200 mL/hr over 60 Minutes Intravenous  Once 08/09/17 0108 08/09/17 0418        Objective:   Vitals:   08/10/17 0442 08/10/17 0456 08/10/17 1100 08/10/17 1300  BP: (!) 146/66  (!) 92/56 136/84  Pulse: 84  74 91  Resp: 16   16  Temp: 99.1 F (37.3 C)   98.7 F (37.1 C)  TempSrc: Axillary   Oral  SpO2: 97%   94%  Weight:  62 kg (136 lb 11 oz)    Height:        Wt Readings from Last 3 Encounters:  08/10/17 62 kg (136 lb 11 oz)  06/01/15 62.9 kg (138 lb 9.6 oz)  04/18/15 63.7 kg (140 lb 6.4 oz)     Intake/Output Summary (Last 24 hours) at 08/10/2017 1650 Last data filed at 08/10/2017 1233 Gross per 24 hour  Intake 680 ml  Output 1450 ml  Net -770 ml      Physical Exam  Gen:- Awake , less agitated, more cooperative HEENT:- [place].AT, No sclera icterus Neck-Supple Neck,No JVD,.  Lungs-  CTAB  CV- S1, S2 normal, left subclavian area AICD Abd-  +ve B.Sounds, Abd Soft, No tenderness, no CVA area tenderness Extremity/Skin:- No  edema,    GU-Foley with clear urine, to be removed Psych-cognitive deficits, agitation and restlessness has improved Neuro-at baseline apparently patient has some degree of aphasia from prior stroke, neuro exam limited at this time due to cognitive challenges   Data Review:   Micro Results Recent Results (from the past 240 hour(s))  Urine Culture     Status: Abnormal (Preliminary result)   Collection Time: 08/08/17 11:50 PM  Result Value Ref Range Status   Specimen Description URINE, CLEAN CATCH  Final  Special Requests NONE  Final   Culture (A)  Final    70,000 COLONIES/mL PROTEUS MIRABILIS SUSCEPTIBILITIES TO FOLLOW Performed at [facility], [address] [place], [place] [address]    Report Status PENDING  Incomplete  Culture, blood (Routine X 2) w Reflex to ID Panel     Status: None (Preliminary result)   Collection Time: 08/09/17  1:06 AM  Result Value Ref Range Status   Specimen Description BLOOD LEFT ARM  Final   Special Requests   Final    BOTTLES DRAWN AEROBIC ONLY Blood Culture adequate volume   Culture NO GROWTH 1 DAY  Final   Report Status PENDING  Incomplete  Culture, blood (Routine X 2) w Reflex to ID Panel     Status: None (Preliminary result)   Collection Time: 08/09/17  1:14 AM  Result Value Ref Range Status   Specimen Description RIGHT ANTECUBITAL  Final   Special Requests   Final    BOTTLES DRAWN AEROBIC ONLY Blood Culture adequate volume   Culture NO GROWTH 1 DAY  Final   Report Status PENDING  Incomplete  MRSA PCR Screening     Status: None   Collection Time: 08/09/17  4:20 AM  Result Value Ref Range Status   MRSA by PCR NEGATIVE NEGATIVE Final    Comment:         The GeneXpert MRSA Assay (FDA approved for NASAL specimens only), is one component of a comprehensive MRSA colonization surveillance program. It is not intended to diagnose MRSA infection nor to guide or monitor treatment for MRSA infections.     Radiology Reports Ct Head Wo Contrast  Result Date: 08/09/2017 CLINICAL DATA:  Altered mental status and combativeness. Recent treatment for urinary tract infection. EXAM: CT HEAD WITHOUT CONTRAST TECHNIQUE: Contiguous axial images were obtained from the base of the skull through the vertex without intravenous contrast. COMPARISON:  Head CT and CTA head 12/26/2014 FINDINGS: Brain: Motion artifact limits the examination. There is diffuse cerebral atrophy. Prominent ventricular dilatation consistent with central atrophy. Low-attenuation changes in the deep white matter consistent small vessel ischemia. There is diffuse encephalomalacia throughout the left frontal and parietal lobes, focally in the right frontal lobe, and throughout the right occipital lobe. This is consistent with multiple old infarcts in the distributions of the left and right middle cerebral artery and in the right posterior cerebral artery. Although infarcts appear chronic, they are new or progressed since the previous study. No mass-effect or midline shift. No abnormal extra-axial fluid collections. Basal cisterns are not effaced. No acute intracranial hemorrhage. Vascular: Intracranial arterial vascular calcifications are present. The basilar artery is dilated and tortuous. Skull: Calvarium appears intact.  No depressed skull fractures. Sinuses/Orbits: Mucosal thickening in the paranasal sinuses. No acute air-fluid levels. Mastoid air cells are not opacified. Other: None. IMPRESSION: No acute intracranial abnormalities. Old appearing infarcts bilaterally in the middle cerebral artery distribution and in the right posterior cerebral artery distribution. Diffuse atrophy and small vessel  ischemic changes. Electronically Signed   By: Burman NievesWilliam  Stevens M.D.   On: 08/09/2017 00:47   Dg Chest Port 1 View  Result Date: 08/09/2017 CLINICAL DATA:  Altered mental status and combativeness. EXAM: PORTABLE CHEST 1 VIEW COMPARISON:  12/26/2014 FINDINGS: Cardiac pacemaker. Shallow inspiration. Cardiac enlargement. Mild diffuse interstitial pattern to the lungs may represent early interstitial edema. Coronary artery stents. No pleural effusions. No pneumothorax. No focal consolidation. Calcification of the aorta. Degenerative changes in the spine and shoulders. IMPRESSION: Shallow  inspiration. Cardiac enlargement. Possible interstitial edema. Aortic atherosclerosis. Electronically Signed   By: Burman Nieves M.D.   On: 08/09/2017 00:48     CBC Recent Labs  Lab 08/08/17 2322 08/09/17 0733  WBC 8.7 8.4  HGB 10.8* 10.7*  HCT 34.5* 34.5*  PLT 225 221  MCV 82.1 82.7  MCH 25.7* 25.7*  MCHC 31.3 31.0  RDW 15.4 15.6*  LYMPHSABS 1.8 1.7  MONOABS 1.0 0.9  EOSABS 0.2 0.2  BASOSABS 0.0 0.0    Chemistries  Recent Labs  Lab 08/08/17 2322 08/09/17 0733  NA 143 141  K 3.2* 3.3*  CL 106 105  CO2 27 25  GLUCOSE 126* 109*  BUN 20 16  CREATININE 1.19* 1.03*  CALCIUM 9.3 8.9  MG  --  2.5*  AST 27  --   ALT 23  --   ALKPHOS 73  --   BILITOT 0.7  --    ------------------------------------------------------------------------------------------------------------------ No results for input(s): CHOL, HDL, LDLCALC, TRIG, CHOLHDL, LDLDIRECT in the last 72 hours.  Lab Results  Component Value Date   HGBA1C 6.3 (H) 12/26/2014   ------------------------------------------------------------------------------------------------------------------ Recent Labs    08/09/17 0114  TSH 3.826   ------------------------------------------------------------------------------------------------------------------ Recent Labs    08/09/17 0114  VITAMINB12 1,146*    Coagulation profile No results for  input(s): INR, PROTIME in the last 168 hours.  No results for input(s): DDIMER in the last 72 hours.  Cardiac Enzymes No results for input(s): CKMB, TROPONINI, MYOGLOBIN in the last 168 hours.  Invalid input(s): CK ------------------------------------------------------------------------------------------------------------------ No results found for: BNP   Shon Hale M.D on 08/10/2017 at 4:50 PM  Between 7am to 7pm - Pager - [phone]  After 7pm go to [email or web address] - password [email or web address]  [facility] -  Office  [phone]   Voice Recognition Reubin [employer] dictation system was used to create this note, attempts have been made to correct errors. Please contact the author with questions and/or clarifications.

## 2017-08-11 DIAGNOSIS — E039 Hypothyroidism, unspecified: Secondary | ICD-10-CM | POA: Diagnosis present

## 2017-08-11 DIAGNOSIS — Z955 Presence of coronary angioplasty implant and graft: Secondary | ICD-10-CM | POA: Diagnosis not present

## 2017-08-11 DIAGNOSIS — N183 Chronic kidney disease, stage 3 (moderate): Secondary | ICD-10-CM | POA: Diagnosis present

## 2017-08-11 DIAGNOSIS — I4891 Unspecified atrial fibrillation: Secondary | ICD-10-CM | POA: Diagnosis not present

## 2017-08-11 DIAGNOSIS — I255 Ischemic cardiomyopathy: Secondary | ICD-10-CM | POA: Diagnosis present

## 2017-08-11 DIAGNOSIS — F0151 Vascular dementia with behavioral disturbance: Secondary | ICD-10-CM | POA: Diagnosis not present

## 2017-08-11 DIAGNOSIS — M8580 Other specified disorders of bone density and structure, unspecified site: Secondary | ICD-10-CM | POA: Diagnosis present

## 2017-08-11 DIAGNOSIS — I480 Paroxysmal atrial fibrillation: Secondary | ICD-10-CM | POA: Diagnosis present

## 2017-08-11 DIAGNOSIS — E876 Hypokalemia: Secondary | ICD-10-CM | POA: Diagnosis present

## 2017-08-11 DIAGNOSIS — Z87891 Personal history of nicotine dependence: Secondary | ICD-10-CM | POA: Diagnosis not present

## 2017-08-11 DIAGNOSIS — I5022 Chronic systolic (congestive) heart failure: Secondary | ICD-10-CM | POA: Diagnosis present

## 2017-08-11 DIAGNOSIS — R410 Disorientation, unspecified: Secondary | ICD-10-CM | POA: Diagnosis not present

## 2017-08-11 DIAGNOSIS — G9340 Encephalopathy, unspecified: Secondary | ICD-10-CM | POA: Diagnosis not present

## 2017-08-11 DIAGNOSIS — Z66 Do not resuscitate: Secondary | ICD-10-CM | POA: Diagnosis present

## 2017-08-11 DIAGNOSIS — F0150 Vascular dementia without behavioral disturbance: Secondary | ICD-10-CM | POA: Diagnosis present

## 2017-08-11 DIAGNOSIS — F05 Delirium due to known physiological condition: Secondary | ICD-10-CM | POA: Diagnosis not present

## 2017-08-11 DIAGNOSIS — B964 Proteus (mirabilis) (morganii) as the cause of diseases classified elsewhere: Secondary | ICD-10-CM

## 2017-08-11 DIAGNOSIS — Z95810 Presence of automatic (implantable) cardiac defibrillator: Secondary | ICD-10-CM | POA: Diagnosis not present

## 2017-08-11 DIAGNOSIS — I10 Essential (primary) hypertension: Secondary | ICD-10-CM | POA: Diagnosis not present

## 2017-08-11 DIAGNOSIS — E785 Hyperlipidemia, unspecified: Secondary | ICD-10-CM | POA: Diagnosis present

## 2017-08-11 DIAGNOSIS — Z96653 Presence of artificial knee joint, bilateral: Secondary | ICD-10-CM | POA: Diagnosis present

## 2017-08-11 DIAGNOSIS — G92 Toxic encephalopathy: Secondary | ICD-10-CM | POA: Diagnosis present

## 2017-08-11 DIAGNOSIS — I69320 Aphasia following cerebral infarction: Secondary | ICD-10-CM | POA: Diagnosis not present

## 2017-08-11 DIAGNOSIS — I69311 Memory deficit following cerebral infarction: Secondary | ICD-10-CM | POA: Diagnosis not present

## 2017-08-11 DIAGNOSIS — I2510 Atherosclerotic heart disease of native coronary artery without angina pectoris: Secondary | ICD-10-CM | POA: Diagnosis present

## 2017-08-11 DIAGNOSIS — N390 Urinary tract infection, site not specified: Principal | ICD-10-CM

## 2017-08-11 DIAGNOSIS — G9341 Metabolic encephalopathy: Secondary | ICD-10-CM | POA: Diagnosis not present

## 2017-08-11 DIAGNOSIS — I130 Hypertensive heart and chronic kidney disease with heart failure and stage 1 through stage 4 chronic kidney disease, or unspecified chronic kidney disease: Secondary | ICD-10-CM | POA: Diagnosis present

## 2017-08-11 DIAGNOSIS — I252 Old myocardial infarction: Secondary | ICD-10-CM | POA: Diagnosis not present

## 2017-08-11 DIAGNOSIS — Z79890 Hormone replacement therapy (postmenopausal): Secondary | ICD-10-CM | POA: Diagnosis not present

## 2017-08-11 LAB — BASIC METABOLIC PANEL
Anion gap: 11 (ref 5–15)
BUN: 25 mg/dL — ABNORMAL HIGH (ref 6–20)
CHLORIDE: 104 mmol/L (ref 101–111)
CO2: 23 mmol/L (ref 22–32)
Calcium: 9.1 mg/dL (ref 8.9–10.3)
Creatinine, Ser: 1.22 mg/dL — ABNORMAL HIGH (ref 0.44–1.00)
GFR calc Af Amer: 46 mL/min — ABNORMAL LOW (ref >60)
GFR calc non Af Amer: 40 mL/min — ABNORMAL LOW (ref >60)
GLUCOSE: 98 mg/dL (ref 65–99)
POTASSIUM: 4.1 mmol/L (ref 3.5–5.1)
Sodium: 138 mmol/L (ref 135–145)

## 2017-08-11 LAB — URINE CULTURE

## 2017-08-11 LAB — FOLATE RBC
Folate, Hemolysate: 381.4 ng/mL
Folate, RBC: 1132 ng/mL (ref >498)
Hematocrit: 33.7 % — ABNORMAL LOW (ref 34.0–46.6)

## 2017-08-11 LAB — CBC
HEMATOCRIT: 36.7 % (ref 36.0–46.0)
Hemoglobin: 11.2 g/dL — ABNORMAL LOW (ref 12.0–15.0)
MCH: 25 pg — ABNORMAL LOW (ref 26.0–34.0)
MCHC: 30.5 g/dL (ref 30.0–36.0)
MCV: 81.9 fL (ref 78.0–100.0)
Platelets: 202 10*3/uL (ref 150–400)
RBC: 4.48 MIL/uL (ref 3.87–5.11)
RDW: 16.4 % — ABNORMAL HIGH (ref 11.5–15.5)
WBC: 8.7 10*3/uL (ref 4.0–10.5)

## 2017-08-11 LAB — GLUCOSE, CAPILLARY: Glucose-Capillary: 132 mg/dL — ABNORMAL HIGH (ref 65–99)

## 2017-08-11 MED ORDER — ENOXAPARIN SODIUM 30 MG/0.3ML ~~LOC~~ SOLN
30.0000 mg | SUBCUTANEOUS | Status: DC
  Administered 2017-08-11 – 2017-08-14 (×4): 30 mg via SUBCUTANEOUS
  Filled 2017-08-11 (×3): qty 0.3

## 2017-08-11 MED ORDER — CEFPODOXIME PROXETIL 200 MG PO TABS: 200.0000 mg | ORAL_TABLET | Freq: Two times a day (BID) | ORAL | Status: DC

## 2017-08-11 MED ORDER — AMOXICILLIN-POT CLAVULANATE 875-125 MG PO TABS: 1.0000 | ORAL_TABLET | Freq: Two times a day (BID) | ORAL | Status: DC

## 2017-08-11 MED ORDER — CEFUROXIME AXETIL 250 MG PO TABS
250.0000 mg | ORAL_TABLET | Freq: Two times a day (BID) | ORAL | Status: DC
  Administered 2017-08-11 – 2017-08-14 (×5): 250 mg via ORAL
  Filled 2017-08-11 (×7): qty 1

## 2017-08-11 NOTE — Progress Notes (Signed)
PROGRESS NOTE                                                                                                                                                                                                             Patient Demographics:    [name], is a 84 y.o. female, DOB - 08/04/1930, ZOX:[id number]  Admit date - 08/08/2017   Admitting Physician Briscoe Deutscher, MD  Outpatient Primary MD for the patient is Richardean Chimera, MD  LOS - 0  Outpatient Specialists: None  Chief Complaint  Patient presents with  . Altered Mental Status       Brief Narrative   84 year old female with history of CVA with residual aphasia, CAD, ischemic myopathy with ICD, hypothyroidism and PAF not on anticoagulation presented from assisted living with increasing confusion with agitation and combativeness. Patient found to have Proteus UTI.   Subjective:   Patient lethargic this morning. Daughter at bedside and feels she is about 50% from her baseline.   Assessment  & Plan :    Principal Problem:   Toxic Acute encephalopathy due to UTI Secondary to Proteus. Was on ciprofloxacin but is resistant to quinolones. Antibiotic switched to Ceftin (reported as allergy with rash to Keflex but will monitor). When necessary low-dose Haldol for agitation. Continue bedtime Seroquel. Head CT and negative for acute stroke. TSH of 3.8.Normal ammonia and B12.  RPR nonreactive. Avoid narcotics or benzos.  Active Problems: History of CVA  Associated multi-infarct dementia and aphasia. Continue aspirin, Plavix and statin. Head CT without acute findings.  Ischemic cardiomyopathy/CAD Euvolemic. Continue aspirin, Plavix, beta blocker and statin. Next line next line paroxysmal A. fib Rate controlled. Continue metoprolol. On aspirin and Plavix. Her CHADS2Vac is 8, not on anticoagulation? Possibly due to fall risk.  Hypokalemia Replenished   Automatic implantable cardioverter-defibrillator in situ    CKD (chronic kidney disease), stage III (HCC) Renal Function stable.  Hypothyroidism   TSH of 2.8. Continue Synthroid.    Code Status : DO NOT RESUSCITATE  Family Communication  : Daughter at bedside  Disposition Plan  : Return to a left possibly tomorrow if mental status improves  Barriers For Discharge : Active symptoms  Consults  : None  Procedures  : CT head  DVT Prophylaxis  :  Lovenox -   Lab Results  Component Value  Date   PLT 202 08/11/2017    Antibiotics  :   Anti-infectives (From admission, onward)   Start     Dose/Rate Route Frequency Ordered Stop   08/11/17 1700  cefUROXime (CEFTIN) tablet 250 mg     250 mg Oral 2 times daily with meals 08/11/17 1059     08/11/17 1100  cefpodoxime (VANTIN) tablet 200 mg  Status:  Discontinued     200 mg Oral Every 12 hours 08/11/17 1050 08/11/17 1052   08/11/17 1100  amoxicillin-clavulanate (AUGMENTIN) 875-125 MG per tablet 1 tablet  Status:  Discontinued     1 tablet Oral Every 12 hours 08/11/17 1052 08/11/17 1059   08/09/17 2100  ciprofloxacin (CIPRO) IVPB 400 mg  Status:  Discontinued     400 mg 200 mL/hr over 60 Minutes Intravenous Every 24 hours 08/09/17 0735 08/11/17 1050   08/09/17 0115  ciprofloxacin (CIPRO) IVPB 400 mg     400 mg 200 mL/hr over 60 Minutes Intravenous  Once 08/09/17 0108 08/09/17 0418        Objective:   Vitals:   08/10/17 1700 08/10/17 1935 08/10/17 2100 08/11/17 0500  BP: (!) 148/83 (!) 84/50 (!) 98/36 (!) 152/80  Pulse:   75 66  Resp:   20 18  Temp: 98.4 F (36.9 C)  (!) 97.5 F (36.4 C) 98.4 F (36.9 C)  TempSrc: Oral  Oral Oral  SpO2:   96% 95%  Weight:    64.1 kg (141 lb 5 oz)  Height:        Wt Readings from Last 3 Encounters:  08/11/17 64.1 kg (141 lb 5 oz)  06/01/15 62.9 kg (138 lb 9.6 oz)  04/18/15 63.7 kg (140 lb 6.4 oz)     Intake/Output Summary (Last 24 hours) at 08/11/2017 1135 Last data filed at 08/11/2017  0900 Gross per 24 hour  Intake 560 ml  Output -  Net 560 ml     Physical Exam  Gen: not in distress, Confused and aphasic  HEENT: no pallor, moist mucosa, supple neck Chest: clear b/l, no added sounds CVS:  S1 and S2 irregular, no murmurs rub or gallop  GI: soft, NT, ND, BS+ Musculoskeletal: warm, no edema CNS: Awake but confused and aphasic.    Data Review:    CBC Recent Labs  Lab 08/08/17 2322 08/09/17 0733 08/11/17 0551  WBC 8.7 8.4 8.7  HGB 10.8* 10.7* 11.2*  HCT 34.5* 34.5* 36.7  PLT 225 221 202  MCV 82.1 82.7 81.9  MCH 25.7* 25.7* 25.0*  MCHC 31.3 31.0 30.5  RDW 15.4 15.6* 16.4*  LYMPHSABS 1.8 1.7  --   MONOABS 1.0 0.9  --   EOSABS 0.2 0.2  --   BASOSABS 0.0 0.0  --     Chemistries  Recent Labs  Lab 08/08/17 2322 08/09/17 0733 08/11/17 0551  NA 143 141 138  K 3.2* 3.3* 4.1  CL 106 105 104  CO2 27 25 23   GLUCOSE 126* 109* 98  BUN 20 16 25*  CREATININE 1.19* 1.03* 1.22*  CALCIUM 9.3 8.9 9.1  MG  --  2.5*  --   AST 27  --   --   ALT 23  --   --   ALKPHOS 73  --   --   BILITOT 0.7  --   --    ------------------------------------------------------------------------------------------------------------------ No results for input(s): CHOL, HDL, LDLCALC, TRIG, CHOLHDL, LDLDIRECT in the last 72 hours.  Lab Results  Component Value Date  HGBA1C 6.3 (H) 12/26/2014   ------------------------------------------------------------------------------------------------------------------ Recent Labs    08/09/17 0114  TSH 3.826   ------------------------------------------------------------------------------------------------------------------ Recent Labs    08/09/17 0114  VITAMINB12 1,146*    Coagulation profile No results for input(s): INR, PROTIME in the last 168 hours.  No results for input(s): DDIMER in the last 72 hours.  Cardiac Enzymes No results for input(s): CKMB, TROPONINI, MYOGLOBIN in the last 168 hours.  Invalid input(s):  CK ------------------------------------------------------------------------------------------------------------------ No results found for: BNP  Inpatient Medications  Scheduled Meds: . aspirin  81 mg Oral Daily  . atorvastatin  40 mg Oral q1800  . cefUROXime  250 mg Oral BID WC  . cholecalciferol  2,000 Units Oral Daily  . clopidogrel  75 mg Oral QHS  . docusate sodium  100 mg Oral Daily  . enoxaparin (LOVENOX) injection  30 mg Subcutaneous Q24H  . levothyroxine  100 mcg Oral QAC breakfast  . metoprolol tartrate  25 mg Oral BID  . misoprostol  100 mcg Oral BID  . potassium chloride  20 mEq Oral Daily  . QUEtiapine  25 mg Oral Q supper  . sertraline  50 mg Oral QHS  . vitamin B-12  1,000 mcg Oral Daily   Continuous Infusions: PRN Meds:.acetaminophen **OR** acetaminophen, bisacodyl, haloperidol lactate, hydrALAZINE, LORazepam, ondansetron **OR** ondansetron (ZOFRAN) IV, senna-docusate  Micro Results Recent Results (from the past 240 hour(s))  Urine Culture     Status: Abnormal   Collection Time: 08/08/17 11:50 PM  Result Value Ref Range Status   Specimen Description URINE, CLEAN CATCH  Final   Special Requests NONE  Final   Culture 70,000 COLONIES/mL PROTEUS MIRABILIS (A)  Final   Report Status 08/11/2017 FINAL  Final   Organism ID, Bacteria PROTEUS MIRABILIS (A)  Final      Susceptibility   Proteus mirabilis - MIC*    AMPICILLIN <=2 SENSITIVE Sensitive     CEFAZOLIN <=4 SENSITIVE Sensitive     CEFTRIAXONE <=1 SENSITIVE Sensitive     CIPROFLOXACIN >=4 RESISTANT Resistant     GENTAMICIN 8 INTERMEDIATE Intermediate     IMIPENEM 0.5 SENSITIVE Sensitive     NITROFURANTOIN >=512 RESISTANT Resistant     TRIMETH/SULFA >=320 RESISTANT Resistant     AMPICILLIN/SULBACTAM <=2 SENSITIVE Sensitive     PIP/TAZO <=4 SENSITIVE Sensitive     * 70,000 COLONIES/mL PROTEUS MIRABILIS  Culture, blood (Routine X 2) w Reflex to ID Panel     Status: None (Preliminary result)   Collection Time:  08/09/17  1:06 AM  Result Value Ref Range Status   Specimen Description BLOOD LEFT ARM  Final   Special Requests   Final    BOTTLES DRAWN AEROBIC ONLY Blood Culture adequate volume   Culture NO GROWTH 2 DAYS  Final   Report Status PENDING  Incomplete  Culture, blood (Routine X 2) w Reflex to ID Panel     Status: None (Preliminary result)   Collection Time: 08/09/17  1:14 AM  Result Value Ref Range Status   Specimen Description RIGHT ANTECUBITAL  Final   Special Requests   Final    BOTTLES DRAWN AEROBIC ONLY Blood Culture adequate volume   Culture NO GROWTH 2 DAYS  Final   Report Status PENDING  Incomplete  MRSA PCR Screening     Status: None   Collection Time: 08/09/17  4:20 AM  Result Value Ref Range Status   MRSA by PCR NEGATIVE NEGATIVE Final    Comment:        The  GeneXpert MRSA Assay (FDA approved for NASAL specimens only), is one component of a comprehensive MRSA colonization surveillance program. It is not intended to diagnose MRSA infection nor to guide or monitor treatment for MRSA infections.     Radiology Reports Ct Head Wo Contrast  Result Date: 08/09/2017 CLINICAL DATA:  Altered mental status and combativeness. Recent treatment for urinary tract infection. EXAM: CT HEAD WITHOUT CONTRAST TECHNIQUE: Contiguous axial images were obtained from the base of the skull through the vertex without intravenous contrast. COMPARISON:  Head CT and CTA head 12/26/2014 FINDINGS: Brain: Motion artifact limits the examination. There is diffuse cerebral atrophy. Prominent ventricular dilatation consistent with central atrophy. Low-attenuation changes in the deep white matter consistent small vessel ischemia. There is diffuse encephalomalacia throughout the left frontal and parietal lobes, focally in the right frontal lobe, and throughout the right occipital lobe. This is consistent with multiple old infarcts in the distributions of the left and right middle cerebral artery and in the  right posterior cerebral artery. Although infarcts appear chronic, they are new or progressed since the previous study. No mass-effect or midline shift. No abnormal extra-axial fluid collections. Basal cisterns are not effaced. No acute intracranial hemorrhage. Vascular: Intracranial arterial vascular calcifications are present. The basilar artery is dilated and tortuous. Skull: Calvarium appears intact.  No depressed skull fractures. Sinuses/Orbits: Mucosal thickening in the paranasal sinuses. No acute air-fluid levels. Mastoid air cells are not opacified. Other: None. IMPRESSION: No acute intracranial abnormalities. Old appearing infarcts bilaterally in the middle cerebral artery distribution and in the right posterior cerebral artery distribution. Diffuse atrophy and small vessel ischemic changes. Electronically Signed   By: Burman NievesWilliam  Stevens M.D.   On: 08/09/2017 00:47   Dg Chest Port 1 View  Result Date: 08/09/2017 CLINICAL DATA:  Altered mental status and combativeness. EXAM: PORTABLE CHEST 1 VIEW COMPARISON:  12/26/2014 FINDINGS: Cardiac pacemaker. Shallow inspiration. Cardiac enlargement. Mild diffuse interstitial pattern to the lungs may represent early interstitial edema. Coronary artery stents. No pleural effusions. No pneumothorax. No focal consolidation. Calcification of the aorta. Degenerative changes in the spine and shoulders. IMPRESSION: Shallow inspiration. Cardiac enlargement. Possible interstitial edema. Aortic atherosclerosis. Electronically Signed   By: Burman NievesWilliam  Stevens M.D.   On: 08/09/2017 00:48    Time Spent in minutes  25   Alsie Younes M.D on 08/11/2017 at 11:35 AM  Between 7am to 7pm - Pager - [phone]  After 7pm go to [email or web address] - password [email or web address]  [facility] -  Office  [phone]

## 2017-08-12 DIAGNOSIS — G92 Toxic encephalopathy: Secondary | ICD-10-CM

## 2017-08-12 MED ORDER — POLYETHYLENE GLYCOL 3350 17 G PO PACK
17.0000 g | PACK | Freq: Every day | ORAL | Status: DC
  Administered 2017-08-13 – 2017-08-14 (×2): 17 g via ORAL
  Filled 2017-08-12 (×2): qty 1

## 2017-08-12 NOTE — Progress Notes (Signed)
PROGRESS NOTE                                                                                                                                                                                                             Patient Demographics:    [name], is a 84 y.o. female, DOB - 1930-08-10, ZOX:[id number]  Admit date - 08/08/2017   Admitting Physician Briscoe Deutscher, MD  Outpatient Primary MD for the patient is Richardean Chimera, MD  LOS - 1  Outpatient Specialists: None  Chief Complaint  Patient presents with  . Altered Mental Status       Brief Narrative   84 year old female with history of CVA with residual aphasia, CAD, ischemic myopathy with ICD, hypothyroidism and PAF not on anticoagulation presented from assisted living with increasing confusion with agitation and combativeness. Patient found to have Proteus UTI.   Subjective:   Patient still sleepy, more awake and communicative but still confused and not back to her baseline mental status. Spitted all her meds later this morning,   Assessment  & Plan :    Principal Problem:   Toxic Acute encephalopathy due to UTI Secondary to Proteus.  Antibiotic switched to Ceftin (reported as allergy with rash to Keflex , no issues since yesterday)When necessary low-dose Haldol for agitation.  Hold Seroquel and zoloft for somnolence.  Head CT and negative for acute stroke. TSH of 3.8.Normal ammonia and B12.  RPR nonreactive. Avoid narcotics and benzos.  Active Problems: History of CVA  Associated multi-infarct dementia and aphasia. Continue aspirin, Plavix and statin. Head CT without acute findings.  Ischemic cardiomyopathy/CAD Euvolemic. Continue aspirin, Plavix, beta blocker and statin. Next line next line paroxysmal A. fib Rate controlled. Continue metoprolol. On aspirin and Plavix. Her CHADS2Vac is 8, not on anticoagulation? Possibly due to fall  risk.  Hypokalemia Replenished    Automatic implantable cardioverter-defibrillator in situ    CKD (chronic kidney disease), stage III (HCC) Renal Function stable.  Hypothyroidism   TSH of 3.8. Continue Synthroid.    Code Status : DO NOT RESUSCITATE  Family Communication  : none  at bedside  Disposition Plan  : Return toALF tomorrow if mental status improves or closer to baseline  Barriers For Discharge : Active symptoms  Consults  : None  Procedures  : CT head  DVT Prophylaxis  :  Lovenox -   Lab Results  Component Value Date   PLT 202 08/11/2017    Antibiotics  :   Anti-infectives (From admission, onward)   Start     Dose/Rate Route Frequency Ordered Stop   08/11/17 1700  cefUROXime (CEFTIN) tablet 250 mg     250 mg Oral 2 times daily with meals 08/11/17 1059     08/11/17 1100  cefpodoxime (VANTIN) tablet 200 mg  Status:  Discontinued     200 mg Oral Every 12 hours 08/11/17 1050 08/11/17 1052   08/11/17 1100  amoxicillin-clavulanate (AUGMENTIN) 875-125 MG per tablet 1 tablet  Status:  Discontinued     1 tablet Oral Every 12 hours 08/11/17 1052 08/11/17 1059   08/09/17 2100  ciprofloxacin (CIPRO) IVPB 400 mg  Status:  Discontinued     400 mg 200 mL/hr over 60 Minutes Intravenous Every 24 hours 08/09/17 0735 08/11/17 1050   08/09/17 0115  ciprofloxacin (CIPRO) IVPB 400 mg     400 mg 200 mL/hr over 60 Minutes Intravenous  Once 08/09/17 0108 08/09/17 0418        Objective:   Vitals:   08/11/17 0500 08/11/17 1427 08/11/17 1900 08/12/17 0500  BP: (!) 152/80 135/68 (!) 155/96 (!) 153/94  Pulse: 66 68 76 74  Resp: 18 18 18 18   Temp: 98.4 F (36.9 C) 98.4 F (36.9 C) (!) 97.3 F (36.3 C) (!) 97.4 F (36.3 C)  TempSrc: Oral Oral Oral Oral  SpO2: 95% 96% 95% 97%  Weight: 64.1 kg (141 lb 5 oz)   60.9 kg (134 lb 3.2 oz)  Height:        Wt Readings from Last 3 Encounters:  08/12/17 60.9 kg (134 lb 3.2 oz)  06/01/15 62.9 kg (138 lb 9.6 oz)  04/18/15 63.7 kg  (140 lb 6.4 oz)     Intake/Output Summary (Last 24 hours) at 08/12/2017 1311 Last data filed at 08/12/2017 0900 Gross per 24 hour  Intake 600 ml  Output -  Net 600 ml     Physical Exam  Gen: not in distress, Confused  HEENT:  moist mucosa, supple neck Chest: clear b/l, no added sounds CVS:  S1 and S2 irregular, no murmurs  GI: soft, NT, ND, Musculoskeletal: warm, no edema CNS: Awake but confused and aphasic. Better oriented from yesterday    Data Review:    CBC Recent Labs  Lab 08/08/17 2322 08/09/17 0733 08/11/17 0551  WBC 8.7 8.4 8.7  HGB 10.8* 10.7* 11.2*  HCT 34.5* 34.5*  33.7* 36.7  PLT 225 221 202  MCV 82.1 82.7 81.9  MCH 25.7* 25.7* 25.0*  MCHC 31.3 31.0 30.5  RDW 15.4 15.6* 16.4*  LYMPHSABS 1.8 1.7  --   MONOABS 1.0 0.9  --   EOSABS 0.2 0.2  --   BASOSABS 0.0 0.0  --     Chemistries  Recent Labs  Lab 08/08/17 2322 08/09/17 0733 08/11/17 0551  NA 143 141 138  K 3.2* 3.3* 4.1  CL 106 105 104  CO2 27 25 23   GLUCOSE 126* 109* 98  BUN 20 16 25*  CREATININE 1.19* 1.03* 1.22*  CALCIUM 9.3 8.9 9.1  MG  --  2.5*  --   AST 27  --   --   ALT 23  --   --   ALKPHOS 73  --   --   BILITOT 0.7  --   --    ------------------------------------------------------------------------------------------------------------------ No results for input(s): CHOL, HDL, LDLCALC, TRIG,  CHOLHDL, LDLDIRECT in the last 72 hours.  Lab Results  Component Value Date   HGBA1C 6.3 (H) 12/26/2014   ------------------------------------------------------------------------------------------------------------------ No results for input(s): TSH, T4TOTAL, T3FREE, THYROIDAB in the last 72 hours.  Invalid input(s): FREET3 ------------------------------------------------------------------------------------------------------------------ No results for input(s): VITAMINB12, FOLATE, FERRITIN, TIBC, IRON, RETICCTPCT in the last 72 hours.  Coagulation profile No results for input(s):  INR, PROTIME in the last 168 hours.  No results for input(s): DDIMER in the last 72 hours.  Cardiac Enzymes No results for input(s): CKMB, TROPONINI, MYOGLOBIN in the last 168 hours.  Invalid input(s): CK ------------------------------------------------------------------------------------------------------------------ No results found for: BNP  Inpatient Medications  Scheduled Meds: . aspirin  81 mg Oral Daily  . atorvastatin  40 mg Oral q1800  . cefUROXime  250 mg Oral BID WC  . cholecalciferol  2,000 Units Oral Daily  . clopidogrel  75 mg Oral QHS  . docusate sodium  100 mg Oral Daily  . enoxaparin (LOVENOX) injection  30 mg Subcutaneous Q24H  . levothyroxine  100 mcg Oral QAC breakfast  . metoprolol tartrate  25 mg Oral BID  . misoprostol  100 mcg Oral BID  . polyethylene glycol  17 g Oral Daily  . potassium chloride  20 mEq Oral Daily  . vitamin B-12  1,000 mcg Oral Daily   Continuous Infusions: PRN Meds:.acetaminophen **OR** acetaminophen, bisacodyl, haloperidol lactate, hydrALAZINE, ondansetron **OR** ondansetron (ZOFRAN) IV, senna-docusate  Micro Results Recent Results (from the past 240 hour(s))  Urine Culture     Status: Abnormal   Collection Time: 08/08/17 11:50 PM  Result Value Ref Range Status   Specimen Description URINE, CLEAN CATCH  Final   Special Requests NONE  Final   Culture 70,000 COLONIES/mL PROTEUS MIRABILIS (A)  Final   Report Status 08/11/2017 FINAL  Final   Organism ID, Bacteria PROTEUS MIRABILIS (A)  Final      Susceptibility   Proteus mirabilis - MIC*    AMPICILLIN <=2 SENSITIVE Sensitive     CEFAZOLIN <=4 SENSITIVE Sensitive     CEFTRIAXONE <=1 SENSITIVE Sensitive     CIPROFLOXACIN >=4 RESISTANT Resistant     GENTAMICIN 8 INTERMEDIATE Intermediate     IMIPENEM 0.5 SENSITIVE Sensitive     NITROFURANTOIN >=512 RESISTANT Resistant     TRIMETH/SULFA >=320 RESISTANT Resistant     AMPICILLIN/SULBACTAM <=2 SENSITIVE Sensitive     PIP/TAZO <=4  SENSITIVE Sensitive     * 70,000 COLONIES/mL PROTEUS MIRABILIS  Culture, blood (Routine X 2) w Reflex to ID Panel     Status: None (Preliminary result)   Collection Time: 08/09/17  1:06 AM  Result Value Ref Range Status   Specimen Description BLOOD LEFT ARM  Final   Special Requests   Final    BOTTLES DRAWN AEROBIC ONLY Blood Culture adequate volume   Culture NO GROWTH 3 DAYS  Final   Report Status PENDING  Incomplete  Culture, blood (Routine X 2) w Reflex to ID Panel     Status: None (Preliminary result)   Collection Time: 08/09/17  1:14 AM  Result Value Ref Range Status   Specimen Description RIGHT ANTECUBITAL  Final   Special Requests   Final    BOTTLES DRAWN AEROBIC ONLY Blood Culture adequate volume   Culture NO GROWTH 3 DAYS  Final   Report Status PENDING  Incomplete  MRSA PCR Screening     Status: None   Collection Time: 08/09/17  4:20 AM  Result Value Ref Range Status   MRSA by PCR  NEGATIVE NEGATIVE Final    Comment:        The GeneXpert MRSA Assay (FDA approved for NASAL specimens only), is one component of a comprehensive MRSA colonization surveillance program. It is not intended to diagnose MRSA infection nor to guide or monitor treatment for MRSA infections.     Radiology Reports Ct Head Wo Contrast  Result Date: 08/09/2017 CLINICAL DATA:  Altered mental status and combativeness. Recent treatment for urinary tract infection. EXAM: CT HEAD WITHOUT CONTRAST TECHNIQUE: Contiguous axial images were obtained from the base of the skull through the vertex without intravenous contrast. COMPARISON:  Head CT and CTA head 12/26/2014 FINDINGS: Brain: Motion artifact limits the examination. There is diffuse cerebral atrophy. Prominent ventricular dilatation consistent with central atrophy. Low-attenuation changes in the deep white matter consistent small vessel ischemia. There is diffuse encephalomalacia throughout the left frontal and parietal lobes, focally in the right frontal  lobe, and throughout the right occipital lobe. This is consistent with multiple old infarcts in the distributions of the left and right middle cerebral artery and in the right posterior cerebral artery. Although infarcts appear chronic, they are new or progressed since the previous study. No mass-effect or midline shift. No abnormal extra-axial fluid collections. Basal cisterns are not effaced. No acute intracranial hemorrhage. Vascular: Intracranial arterial vascular calcifications are present. The basilar artery is dilated and tortuous. Skull: Calvarium appears intact.  No depressed skull fractures. Sinuses/Orbits: Mucosal thickening in the paranasal sinuses. No acute air-fluid levels. Mastoid air cells are not opacified. Other: None. IMPRESSION: No acute intracranial abnormalities. Old appearing infarcts bilaterally in the middle cerebral artery distribution and in the right posterior cerebral artery distribution. Diffuse atrophy and small vessel ischemic changes. Electronically Signed   By: Burman NievesWilliam  Stevens M.D.   On: 08/09/2017 00:47   Dg Chest Port 1 View  Result Date: 08/09/2017 CLINICAL DATA:  Altered mental status and combativeness. EXAM: PORTABLE CHEST 1 VIEW COMPARISON:  12/26/2014 FINDINGS: Cardiac pacemaker. Shallow inspiration. Cardiac enlargement. Mild diffuse interstitial pattern to the lungs may represent early interstitial edema. Coronary artery stents. No pleural effusions. No pneumothorax. No focal consolidation. Calcification of the aorta. Degenerative changes in the spine and shoulders. IMPRESSION: Shallow inspiration. Cardiac enlargement. Possible interstitial edema. Aortic atherosclerosis. Electronically Signed   By: Burman NievesWilliam  Stevens M.D.   On: 08/09/2017 00:48    Time Spent in minutes  25   Claudeen Leason M.D on 08/12/2017 at 1:11 PM  Between 7am to 7pm - Pager - [phone]  After 7pm go to [email or web address] - password [email or web address]  [facility] -  Office  [phone]

## 2017-08-12 NOTE — Clinical Social Work Note (Signed)
Clinical Social Work Assessment  Patient Details  Name: [name] MRN: [id number] Date of Birth: Jun 05, 1931  Date of referral:  08/12/17               Reason for consult:  Other (Comment Required)(From [facility])                Permission sought to share information with:    Permission granted to share information::     Name::        Agency::     Relationship::     Contact Information:  Dtr, Zadie CleverlyJanice Dunlap  Housing/Transportation Living arrangements for the past 2 months:  ([facility]) Source of Information:  Adult Children Patient Interpreter Needed:  None Criminal Activity/Legal Involvement Pertinent to Current Situation/Hospitalization:  No - Comment as needed Significant Relationships:  Adult Children Lives with:  Facility Resident Do you feel safe going back to the place where you live?  Yes Need for family participation in patient care:  Yes (Comment)  Care giving concerns:  None identified   Social Worker assessment / plan:  [facility]. She is able to dress herself, toilet and feed herself all with very minimal assistance.  Staff provides minimal assistance with showering. Patient uses a rolling walker. She and family are agreeable to return to the facility at discharge.   Employment status:  Retired Health and safety inspectornsurance information:  Medicare PT Recommendations:  Not assessed at this time Information / Referral to community resources:     Patient/Family's Response to care:  Patient is agreeable to return to facility at discharge.  Patient/Family's Understanding of and Emotional Response to Diagnosis, Current Treatment, and Prognosis: Patient and family understand patient's diagnosis, treatment and prognosis and feel that patient can best be served on in her current facility.   Emotional Assessment Appearance:  Appears stated age Attitude/Demeanor/Rapport:    Affect (typically observed):  Accepting Orientation:   Oriented to Self Alcohol / Substance use:  Not Applicable Psych involvement (Current and /or in the community):  No (Comment)  Discharge Needs  Concerns to be addressed:  Other (Comment Required([facility]) Readmission within the last 30 days:  No Current discharge risk:  None Barriers to Discharge:  No Barriers Identified   Annice NeedySettle, Calden Dorsey D, LCSW 08/12/2017, 11:10 AM

## 2017-08-12 NOTE — Progress Notes (Signed)
Notified Dr. Gonzella Lexhungel that patient refused all PO 1000 medications, attempted to give meds crushed in apple sauce but patient continued to spit all meds out. No family in room at time of med pass.

## 2017-08-12 NOTE — Care Management Important Message (Signed)
Important Message  Patient Details  Name: Donnie Ahoatty S Slatten MRN: [id number] Date of Birth: 1930/08/26   Medicare Important Message Given:  Yes    Malcolm MetroChildress, Tristian Bouska Demske, RN 08/12/2017, 10:29 AM

## 2017-08-12 NOTE — Progress Notes (Signed)
Pt very agitated tonight. She refused vital signs and night time meds. She kept taking her clothes off and trying to pull tape from IV. Safety mitts were applied. Will continue to monitor patient.

## 2017-08-13 DIAGNOSIS — R410 Disorientation, unspecified: Secondary | ICD-10-CM

## 2017-08-13 LAB — GLUCOSE, CAPILLARY: GLUCOSE-CAPILLARY: 85 mg/dL (ref 65–99)

## 2017-08-13 MED ORDER — QUETIAPINE FUMARATE 25 MG PO TABS
25.0000 mg | ORAL_TABLET | Freq: Two times a day (BID) | ORAL | Status: DC
  Administered 2017-08-13 – 2017-08-14 (×2): 25 mg via ORAL
  Filled 2017-08-13 (×2): qty 1

## 2017-08-13 NOTE — NC FL2 (Deleted)
[place] MEDICAID FL2 LEVEL OF CARE SCREENING TOOL     IDENTIFICATION  Patient Name: [name] Birthdate: 02-08-1931 Sex: female Admission Date (Current Location): 08/08/2017  [place] [facility]   Facility and Address:  [facility],  [address], [place] [id number]      Provider Number: [id number]  Attending Physician Name and Address:  Eddie Northhungel, Nishant, MD  Relative Name and Phone Number:       Current Level of Care: Hospital Recommended Level of Care: Skilled Nursing Facility Prior Approval Number:    Date Approved/Denied:   PASRR Number: [id number]  Discharge Plan: SNF    Current Diagnoses: Patient Active Problem List   Diagnosis Date Noted  . UTI (urinary tract infection) 08/11/2017  . Proteus mirabilis infection/UTI 08/10/2017  . AF (paroxysmal atrial fibrillation) (HCC) 08/09/2017  . Acute lower UTI 08/09/2017  . Hypokalemia 08/09/2017  . Toxic Acute encephalopathy due to UTI 08/09/2017  . CKD (chronic kidney disease), stage III (HCC) 08/09/2017  . CAD in native artery 03/15/2015  . Cerebrovascular accident, old 03/15/2015  . Hypothyroidism due to Hashimoto's thyroiditis 03/15/2015  . Neuropathy 03/15/2015  . Avitaminosis D 03/15/2015  . Hypercholesterolemia without hypertriglyceridemia 03/15/2015  . Pain in joint, shoulder region 02/13/2015  . Cellulitis 01/28/2015  . Renal insufficiency 01/06/2015  . Hypothyroidism 12/28/2014  . Expressive aphasia 12/28/2014  . Acute ischemic left MCA stroke (HCC) 12/26/2014  . Exertional dyspnea 05/23/2014  . Spider veins of both lower extremities 05/10/2014  . Varicose veins of leg with complications 05/10/2014  . ICD (implantable cardioverter-defibrillator) in place 04/22/2014  . VF (ventricular fibrillation) (HCC) 03/29/2014  . Mixed hyperlipidemia 04/21/2013  . Chronic systolic heart failure (HCC) 03/26/2013  . Ischemic cardiomyopathy   . Coronary atherosclerosis of  native coronary artery   . COPD 02/27/2009  . SPINAL STENOSIS 02/27/2009  . Essential hypertension, benign 02/01/2009  . Automatic implantable cardioverter-defibrillator in situ 02/01/2009    Orientation RESPIRATION BLADDER Height & Weight     Self  Normal Incontinent Weight: 133 lb 6.1 oz (60.5 kg) Height:  5' (152.4 cm)  BEHAVIORAL SYMPTOMS/MOOD NEUROLOGICAL BOWEL NUTRITION STATUS      Incontinent (Heart Healthy)  AMBULATORY STATUS COMMUNICATION OF NEEDS Skin   Limited Assist Verbally Normal                       Personal Care Assistance Level of Assistance  Bathing, Feeding, Dressing Bathing Assistance: Limited assistance Feeding assistance: Independent Dressing Assistance: Limited assistance     Functional Limitations Info             SPECIAL CARE FACTORS FREQUENCY  PT (By licensed PT)     PT Frequency: 5x/week              Contractures Contractures Info: Not present    Additional Factors Info  Code Status, Allergies, Psychotropic Code Status Info: Full code Allergies Info: Nsaids, Oxycodone Hcl, Povidone-iodine, Prednisone, Amoxicillian-potClavulante, Betadine, Doxazosin Mesylate, Cephalexin, Cymbalta, Gabapentin, Neomycin, Tape Psychotropic Info: Risperdal, Zoloft         Current Medications (08/13/2017):  This is the current hospital active medication list Current Facility-Administered Medications  Medication Dose Route Frequency Provider Last Rate Last Dose  . acetaminophen (TYLENOL) tablet 650 mg  650 mg Oral Q6H PRN Opyd, Lavone Neriimothy S, MD   650 mg at 08/12/17 1253   Or  . acetaminophen (TYLENOL) suppository 650 mg  650 mg Rectal Q6H PRN Opyd, Lavone Neriimothy S,  MD      . aspirin chewable tablet 81 mg  81 mg Oral Daily Opyd, Lavone Neri, MD   81 mg at 08/13/17 1008  . atorvastatin (LIPITOR) tablet 40 mg  40 mg Oral q1800 Opyd, Lavone Neri, MD   40 mg at 08/13/17 1600  . bisacodyl (DULCOLAX) EC tablet 5 mg  5 mg Oral Daily PRN Opyd, Lavone Neri, MD      .  cefUROXime (CEFTIN) tablet 250 mg  250 mg Oral BID WC Dhungel, Nishant, MD   250 mg at 08/13/17 1559  . cholecalciferol (VITAMIN D) tablet 2,000 Units  2,000 Units Oral Daily Opyd, Lavone Neri, MD   2,000 Units at 08/13/17 1008  . clopidogrel (PLAVIX) tablet 75 mg  75 mg Oral QHS Briscoe Deutscher, MD   75 mg at 08/11/17 2124  . docusate sodium (COLACE) capsule 100 mg  100 mg Oral Daily Opyd, Lavone Neri, MD   100 mg at 08/13/17 1009  . enoxaparin (LOVENOX) injection 30 mg  30 mg Subcutaneous Q24H Dhungel, Nishant, MD   30 mg at 08/13/17 1006  . haloperidol lactate (HALDOL) injection 5 mg  5 mg Intramuscular Q6H PRN Emokpae, Courage, MD   5 mg at 08/13/17 1929  . hydrALAZINE (APRESOLINE) injection 10 mg  10 mg Intravenous Q4H PRN Opyd, Lavone Neri, MD   10 mg at 08/10/17 0223  . levothyroxine (SYNTHROID, LEVOTHROID) tablet 100 mcg  100 mcg Oral QAC breakfast Opyd, Lavone Neri, MD   100 mcg at 08/13/17 1008  . metoprolol tartrate (LOPRESSOR) tablet 25 mg  25 mg Oral BID Mariea Clonts, Courage, MD   25 mg at 08/13/17 1008  . misoprostol (CYTOTEC) tablet 100 mcg  100 mcg Oral BID Briscoe Deutscher, MD   100 mcg at 08/13/17 1008  . ondansetron (ZOFRAN) tablet 4 mg  4 mg Oral Q6H PRN Opyd, Lavone Neri, MD       Or  . ondansetron (ZOFRAN) injection 4 mg  4 mg Intravenous Q6H PRN Opyd, Lavone Neri, MD      . polyethylene glycol (MIRALAX / GLYCOLAX) packet 17 g  17 g Oral Daily Dhungel, Nishant, MD   17 g at 08/13/17 1008  . potassium chloride SA (K-DUR,KLOR-CON) CR tablet 20 mEq  20 mEq Oral Daily Opyd, Lavone Neri, MD   20 mEq at 08/13/17 1008  . QUEtiapine (SEROQUEL) tablet 25 mg  25 mg Oral BID Dhungel, Nishant, MD   25 mg at 08/13/17 1559  . senna-docusate (Senokot-S) tablet 1 tablet  1 tablet Oral QHS PRN Opyd, Lavone Neri, MD      . vitamin B-12 (CYANOCOBALAMIN) tablet 1,000 mcg  1,000 mcg Oral Daily Opyd, Lavone Neri, MD   1,000 mcg at 08/13/17 1008     Discharge Medications: Please see discharge summary for a list of  discharge medications.  Relevant Imaging Results:  Relevant Lab Results:   Additional Information SSN [id number], Juleen China, LCSW

## 2017-08-13 NOTE — Clinical Social Work Note (Signed)
LCSW spoke with  Patient's daughter, Ms. Carey BullocksJarrell, who indicated that she is hoping [facility]. She stated that she wanted to have [facility] assess patient in the morning before she made a decision related to placement on the SNF level. Ms. Carey BullocksJarrell stated that she felt that patient would do better if she [facility].  She indicated that [facility], however the patient did not have a good experience.    LCSW will follow up Ms. Carey BullocksJarrell after facility assesses patient to identify discharge plan needed for patient.    Delani Kohli, Juleen ChinaHeather D, LCSW

## 2017-08-13 NOTE — Evaluation (Signed)
Physical Therapy Evaluation Patient Details Name: [name] MRN: [id number] DOB: 1931/01/12 Today's Date: 08/13/2017   History of Present Illness  [name] is a 84 y.o. female with medical history significant for history of CVA, coronary artery disease, history of ischemic cardiomyopathy with ICD, hypothyroidism, and paroxysmal atrial fibrillation not anticoagulated, now presenting to the emergency department from her SNF for evaluation of increased confusion, agitation, and combativeness.  Patient is accompanied by her daughter who provides most of the history.  She had [facility], had persistent symptoms, and was prescribed ciprofloxacin yesterday.  She has been refusing medications and past couple days, increasingly confused, and then became agitated and combative tonight, requiring sedation with IM Versed given by EMS.  She has not had any fevers documented and has now been expressing any specific complaints.  There was no recent fall or trauma reported and she is not known to use illicit substances.    Clinical Impression  Patient limited to 15 feet in room requiring verbal/tactile cueing to step closer to walker with fair/poor return, at the present a fall risk, possibly limited due to taking Haldol over night per RN.  Patient will benefit from continued physical therapy in hospital and recommended venue below to increase strength, balance, endurance for safe ADLs and gait.    Follow Up Recommendations SNF;Supervision/Assistance - 24 hour    Equipment Recommendations  None recommended by PT    Recommendations for Other Services       Precautions / Restrictions Precautions Precautions: Fall Restrictions Weight Bearing Restrictions: No      Mobility  Bed Mobility Overal bed mobility: Needs Assistance Bed Mobility: Supine to Sit;Sit to Supine     Supine to sit: Min assist Sit to supine: Mod  assist      Transfers Overall transfer level: Needs assistance Equipment used: Rolling walker (2 wheeled) Transfers: Sit to/from Stand Sit to Stand: Min assist            Ambulation/Gait Ambulation/Gait assistance: Mod assist Ambulation Distance (Feet): 15 Feet Assistive device: Rolling walker (2 wheeled) Gait Pattern/deviations: Decreased step length - right;Decreased step length - left;Decreased stride length   Gait velocity interpretation: Below normal speed for age/gender General Gait Details: demonstrates unsteady slow labored cadence, wide base of support, pushes RW to far in front requiring therapist to prevent patient from falling, limited secondary to fatigue/lethargy  Stairs            Wheelchair Mobility    Modified Rankin (Stroke Patients Only)       Balance Overall balance assessment: Needs assistance Sitting-balance support: No upper extremity supported;Feet supported Sitting balance-Leahy Scale: Fair     Standing balance support: Bilateral upper extremity supported;During functional activity Standing balance-Leahy Scale: Fair Standing balance comment: fair/poor with RW                             Pertinent Vitals/Pain Pain Assessment: No/denies pain    Home Living Family/patient expects to be discharged to:: Assisted living               Home Equipment: Walker - 2 wheels      Prior Function Level of Independence: Independent with assistive device(s);Needs assistance   Gait / Transfers Assistance Needed: household distances with RW  ADL's / Homemaking Assistance Needed: assisted by facility staff        Hand Dominance  Extremity/Trunk Assessment   Upper Extremity Assessment Upper Extremity Assessment: Generalized weakness    Lower Extremity Assessment Lower Extremity Assessment: Generalized weakness    Cervical / Trunk Assessment Cervical / Trunk Assessment: Kyphotic  Communication    Communication: No difficulties  Cognition Arousal/Alertness: Awake/alert;Lethargic Behavior During Therapy: WFL for tasks assessed/performed Overall Cognitive Status: Within Functional Limits for tasks assessed                                 General Comments: slightly lethargic possibly due to receiving Haldol over night per RN      General Comments      Exercises     Assessment/Plan    PT Assessment Patient needs continued PT services  PT Problem List Decreased strength;Decreased activity tolerance;Decreased balance;Decreased mobility       PT Treatment Interventions Gait training;Functional mobility training;Therapeutic activities;Therapeutic exercise;Patient/family education    PT Goals (Current goals can be found in the Care Plan section)  Acute Rehab PT Goals Patient Stated Goal: return home Time For Goal Achievement: 08/20/17 Potential to Achieve Goals: Good    Frequency Min 3X/week   Barriers to discharge        Co-evaluation               AM-PAC PT "6 Clicks" Daily Activity  Outcome Measure Difficulty turning over in bed (including adjusting bedclothes, sheets and blankets)?: A Little Difficulty moving from lying on back to sitting on the side of the bed? : A Little Difficulty sitting down on and standing up from a chair with arms (e.g., wheelchair, bedside commode, etc,.)?: A Lot Help needed moving to and from a bed to chair (including a wheelchair)?: A Lot Help needed walking in hospital room?: A Lot Help needed climbing 3-5 steps with a railing? : Total 6 Click Score: 13    End of Session Equipment Utilized During Treatment: Gait belt Activity Tolerance: Patient limited by fatigue;Patient limited by lethargy Patient left: in bed;with call bell/phone within reach;with bed alarm set Nurse Communication: Mobility status PT Visit Diagnosis: Unsteadiness on feet (R26.81);Other abnormalities of gait and mobility (R26.89);Muscle weakness  (generalized) (M62.81)    Time: 1610-9604 PT Time Calculation (min) (ACUTE ONLY): 24 min   Charges:   PT Evaluation $PT Eval Moderate Complexity: 1 Mod PT Treatments $Therapeutic Activity: 23-37 mins   PT G Codes:        2:46 PM, 2017/09/09 Ocie Bob, MPT Physical Therapist with [facility] 336 [phone] office [phone] mobile phone

## 2017-08-13 NOTE — Plan of Care (Signed)
  Acute Rehab PT Goals(only PT should resolve) Pt Will Go Supine/Side To Sit 08/13/2017 1453 - Progressing by Ocie BobWatkins, Sylvan Sookdeo, PT Flowsheets Taken 08/13/2017 1453  Pt will go Supine/Side to Sit with min guard assist Patient Will Transfer Sit To/From Stand 08/13/2017 1453 - Progressing by Ocie BobWatkins, Khaza Blansett, PT Flowsheets Taken 08/13/2017 1453  Patient will transfer sit to/from stand with min guard assist Pt Will Transfer Bed To Chair/Chair To Bed 08/13/2017 1453 - Progressing by Ocie BobWatkins, Allex Lapoint, PT Flowsheets Taken 08/13/2017 1453  Pt will Transfer Bed to Chair/Chair to Bed min guard assist Pt Will Ambulate 08/13/2017 1453 - Progressing by Ocie BobWatkins, Delara Shepheard, PT Flowsheets Taken 08/13/2017 1453  Pt will Ambulate 50 feet;with minimal assist;with rolling walker  2:54 PM, 08/13/17 Ocie BobJames Devorah Givhan, MPT Physical Therapist [facility] [phone] office [phone] mobile phone

## 2017-08-13 NOTE — NC FL2 (Deleted)
[place] MEDICAID FL2 LEVEL OF CARE SCREENING TOOL     IDENTIFICATION  Patient Name: [name] Birthdate: 06/17/1931 Sex: female Admission Date (Current Location): 08/08/2017  [facility] [place]   Facility and Address:  [facility],  [address], [place] [id number]      Provider Number: [id number]  Attending Physician Name and Address:  Eddie Northhungel, Nishant, MD  Relative Name and Phone Number:       Current Level of Care: Hospital Recommended Level of Care: Skilled Nursing Facility Prior Approval Number:    Date Approved/Denied:   PASRR Number: [id number]  Discharge Plan: SNF    Current Diagnoses: Patient Active Problem List   Diagnosis Date Noted  . UTI (urinary tract infection) 08/11/2017  . Proteus mirabilis infection/UTI 08/10/2017  . AF (paroxysmal atrial fibrillation) (HCC) 08/09/2017  . Acute lower UTI 08/09/2017  . Hypokalemia 08/09/2017  . Toxic Acute encephalopathy due to UTI 08/09/2017  . CKD (chronic kidney disease), stage III (HCC) 08/09/2017  . CAD in native artery 03/15/2015  . Cerebrovascular accident, old 03/15/2015  . Hypothyroidism due to Hashimoto's thyroiditis 03/15/2015  . Neuropathy 03/15/2015  . Avitaminosis D 03/15/2015  . Hypercholesterolemia without hypertriglyceridemia 03/15/2015  . Pain in joint, shoulder region 02/13/2015  . Cellulitis 01/28/2015  . Renal insufficiency 01/06/2015  . Hypothyroidism 12/28/2014  . Expressive aphasia 12/28/2014  . Acute ischemic left MCA stroke (HCC) 12/26/2014  . Exertional dyspnea 05/23/2014  . Spider veins of both lower extremities 05/10/2014  . Varicose veins of leg with complications 05/10/2014  . ICD (implantable cardioverter-defibrillator) in place 04/22/2014  . VF (ventricular fibrillation) (HCC) 03/29/2014  . Mixed hyperlipidemia 04/21/2013  . Chronic systolic heart failure (HCC) 03/26/2013  . Ischemic cardiomyopathy   . Coronary atherosclerosis of  native coronary artery   . COPD 02/27/2009  . SPINAL STENOSIS 02/27/2009  . Essential hypertension, benign 02/01/2009  . Automatic implantable cardioverter-defibrillator in situ 02/01/2009    Orientation RESPIRATION BLADDER Height & Weight     Self  Normal Incontinent Weight: 133 lb 6.1 oz (60.5 kg) Height:  5' (152.4 cm)  BEHAVIORAL SYMPTOMS/MOOD NEUROLOGICAL BOWEL NUTRITION STATUS      Incontinent (Heart Healthy)  AMBULATORY STATUS COMMUNICATION OF NEEDS Skin   Limited Assist Verbally Normal                       Personal Care Assistance Level of Assistance  Bathing, Feeding, Dressing Bathing Assistance: Limited assistance Feeding assistance: Independent Dressing Assistance: Limited assistance     Functional Limitations Info             SPECIAL CARE FACTORS FREQUENCY                       Contractures Contractures Info: Not present    Additional Factors Info  Code Status, Allergies, Psychotropic Code Status Info: Full code Allergies Info: Nsaids, Oxycodone Hcl, Povidone-iodine, Prednisone, Amoxicillian-potClavulante, Betadine, Doxazosin Mesylate, Cephalexin, Cymbalta, Gabapentin, Neomycin, Tape Psychotropic Info: Risperdal, Zoloft         Current Medications (08/13/2017):  This is the current hospital active medication list Current Facility-Administered Medications  Medication Dose Route Frequency Provider Last Rate Last Dose  . acetaminophen (TYLENOL) tablet 650 mg  650 mg Oral Q6H PRN Opyd, Lavone Neriimothy S, MD   650 mg at 08/12/17 1253   Or  . acetaminophen (TYLENOL) suppository 650 mg  650 mg Rectal Q6H PRN Opyd, Lavone Neriimothy S, MD      .  aspirin chewable tablet 81 mg  81 mg Oral Daily Opyd, Lavone Neri, MD   81 mg at 08/13/17 1008  . atorvastatin (LIPITOR) tablet 40 mg  40 mg Oral q1800 Opyd, Lavone Neri, MD   40 mg at 08/13/17 1600  . bisacodyl (DULCOLAX) EC tablet 5 mg  5 mg Oral Daily PRN Opyd, Lavone Neri, MD      . cefUROXime (CEFTIN) tablet 250 mg  250 mg  Oral BID WC Dhungel, Nishant, MD   250 mg at 08/13/17 1559  . cholecalciferol (VITAMIN D) tablet 2,000 Units  2,000 Units Oral Daily Opyd, Lavone Neri, MD   2,000 Units at 08/13/17 1008  . clopidogrel (PLAVIX) tablet 75 mg  75 mg Oral QHS Briscoe Deutscher, MD   75 mg at 08/11/17 2124  . docusate sodium (COLACE) capsule 100 mg  100 mg Oral Daily Opyd, Lavone Neri, MD   100 mg at 08/13/17 1009  . enoxaparin (LOVENOX) injection 30 mg  30 mg Subcutaneous Q24H Dhungel, Nishant, MD   30 mg at 08/13/17 1006  . haloperidol lactate (HALDOL) injection 5 mg  5 mg Intramuscular Q6H PRN Emokpae, Courage, MD   5 mg at 08/12/17 2227  . hydrALAZINE (APRESOLINE) injection 10 mg  10 mg Intravenous Q4H PRN Opyd, Lavone Neri, MD   10 mg at 08/10/17 0223  . levothyroxine (SYNTHROID, LEVOTHROID) tablet 100 mcg  100 mcg Oral QAC breakfast Opyd, Lavone Neri, MD   100 mcg at 08/13/17 1008  . metoprolol tartrate (LOPRESSOR) tablet 25 mg  25 mg Oral BID Mariea Clonts, Courage, MD   25 mg at 08/13/17 1008  . misoprostol (CYTOTEC) tablet 100 mcg  100 mcg Oral BID Briscoe Deutscher, MD   100 mcg at 08/13/17 1008  . ondansetron (ZOFRAN) tablet 4 mg  4 mg Oral Q6H PRN Opyd, Lavone Neri, MD       Or  . ondansetron (ZOFRAN) injection 4 mg  4 mg Intravenous Q6H PRN Opyd, Lavone Neri, MD      . polyethylene glycol (MIRALAX / GLYCOLAX) packet 17 g  17 g Oral Daily Dhungel, Nishant, MD   17 g at 08/13/17 1008  . potassium chloride SA (K-DUR,KLOR-CON) CR tablet 20 mEq  20 mEq Oral Daily Opyd, Lavone Neri, MD   20 mEq at 08/13/17 1008  . QUEtiapine (SEROQUEL) tablet 25 mg  25 mg Oral BID Dhungel, Nishant, MD   25 mg at 08/13/17 1559  . senna-docusate (Senokot-S) tablet 1 tablet  1 tablet Oral QHS PRN Opyd, Lavone Neri, MD      . vitamin B-12 (CYANOCOBALAMIN) tablet 1,000 mcg  1,000 mcg Oral Daily Opyd, Lavone Neri, MD   1,000 mcg at 08/13/17 1008     Discharge Medications: Please see discharge summary for a list of discharge medications.  Relevant Imaging  Results:  Relevant Lab Results:   Additional Information SSN [id number], Juleen China, LCSW

## 2017-08-13 NOTE — Clinical Social Work Note (Signed)
PT evaluated patient and recommended SNF. Facility administrator, Hedda SladeLeslie Chilton, spoke with CM and advised that she would assess patient on 08/14/17 to identify is she felt she could manage patient in facility. LCSW to follow up with patient daughter to identify SNF facilities of interest should the her current facility be unable to accept back immediately.    Hitoshi Werts, Juleen ChinaHeather D, LCSW

## 2017-08-13 NOTE — Plan of Care (Signed)
  Activity: Risk for activity intolerance will decrease 08/13/2017 1131 - Progressing by Sequan Auxier, Jodelle GrossMegan H, RN Note Patient has not been OOB - notified MD for PT consult, PT saw patient, recommending SNF.  Patient weak/unsteady

## 2017-08-13 NOTE — Progress Notes (Addendum)
PROGRESS NOTE                                                                                                                                                                                                             Patient Demographics:    [name], is a 84 y.o. female, DOB - 1930-11-26, JYN:[id number]  Admit date - 08/08/2017   Admitting Physician Briscoe Deutscherimothy S Opyd, MD  Outpatient Primary MD for the patient is [name], Terry G, MD  LOS - 2  Outpatient Specialists: None  Chief Complaint  Patient presents with  . Altered Mental Status       Brief Narrative   84 year old female with history of CVA with residual aphasia, CAD, ischemic myopathy with ICD, hypothyroidism and PAF not on anticoagulation presented from assisted living with increasing confusion with agitation and combativeness. Patient found to have Proteus UTI.   Subjective:   Was refusing all her meds yesterday. Became agitated last night requiring haldol. More calm today, although confused. Taking all her meds   Assessment  & Plan :    Principal Problem:   Toxic Acute encephalopathy due to UTI Secondary to Proteus.  Antibiotic switched to Ceftin.  Place back on sequel for agitation, increase dose, d/c zoloft.  Head CT and negative for acute stroke. TSH of 3.8.Normal ammonia and B12.  RPR nonreactive. Avoid narcotics and benzos.  Active Problems: History of CVA  Associated multi-infarct dementia and aphasia. Has acute delirium. Continue aspirin, Plavix and statin. Head CT without acute findings.  Ischemic cardiomyopathy/CAD Euvolemic. Continue aspirin, Plavix, beta blocker and statin.    paroxysmal A. fib Rate controlled. Continue metoprolol. On aspirin and Plavix. Her CHADS2Vac is 8, not on anticoagulation? Possibly due to fall risk.  Hypokalemia Replenished    Automatic implantable cardioverter-defibrillator in situ    CKD (chronic  kidney disease), stage III (HCC) Renal Function stable.  Hypothyroidism   TSH of 3.8. Continue Synthroid.  deconditioning  Seen by PT and recommends SNF. She gets PT at her group home but possibly needs more nursing care. SW aware. I will reassess in am for discharge to SNF vs return to group home.   Code Status : DO NOT RESUSCITATE  Family Communication  : sone  at bedside  Disposition Plan  : Return to group home vs SNF tomorrow   Barriers  For Discharge : Active symptoms  Consults  : None  Procedures  : CT head  DVT Prophylaxis  :  Lovenox -   Lab Results  Component Value Date   PLT 202 08/11/2017    Antibiotics  :   Anti-infectives (From admission, onward)   Start     Dose/Rate Route Frequency Ordered Stop   08/11/17 1700  cefUROXime (CEFTIN) tablet 250 mg     250 mg Oral 2 times daily with meals 08/11/17 1059     08/11/17 1100  cefpodoxime (VANTIN) tablet 200 mg  Status:  Discontinued     200 mg Oral Every 12 hours 08/11/17 1050 08/11/17 1052   08/11/17 1100  amoxicillin-clavulanate (AUGMENTIN) 875-125 MG per tablet 1 tablet  Status:  Discontinued     1 tablet Oral Every 12 hours 08/11/17 1052 08/11/17 1059   08/09/17 2100  ciprofloxacin (CIPRO) IVPB 400 mg  Status:  Discontinued     400 mg 200 mL/hr over 60 Minutes Intravenous Every 24 hours 08/09/17 0735 08/11/17 1050   08/09/17 0115  ciprofloxacin (CIPRO) IVPB 400 mg     400 mg 200 mL/hr over 60 Minutes Intravenous  Once 08/09/17 0108 08/09/17 0418        Objective:   Vitals:   08/12/17 1554 08/13/17 0400 08/13/17 0803 08/13/17 1300  BP: 133/90 (!) 134/96  (!) 169/94  Pulse: 64 84  77  Resp:    18  Temp:  97.8 F (36.6 C)  98.4 F (36.9 C)  TempSrc:  Axillary  Oral  SpO2:  96% 94% 100%  Weight:  60.5 kg (133 lb 6.1 oz)    Height:        Wt Readings from Last 3 Encounters:  08/13/17 60.5 kg (133 lb 6.1 oz)  06/01/15 62.9 kg (138 lb 9.6 oz)  04/18/15 63.7 kg (140 lb 6.4 oz)     Intake/Output  Summary (Last 24 hours) at 08/13/2017 1510 Last data filed at 08/13/2017 0900 Gross per 24 hour  Intake 0 ml  Output -  Net 0 ml     Physical Exam Gen: Elderly female, confused, not in distress HEENT: Moist mucosa, supple neck Chest: Clear bilaterally CVS: S1-S2 irregular GI: Soft, nondistended, nontender Musculoskeletal: Warm, no edema CNS: Alert and awake, confused    Data Review:    CBC Recent Labs  Lab 08/08/17 2322 08/09/17 0733 08/11/17 0551  WBC 8.7 8.4 8.7  HGB 10.8* 10.7* 11.2*  HCT 34.5* 34.5*  33.7* 36.7  PLT 225 221 202  MCV 82.1 82.7 81.9  MCH 25.7* 25.7* 25.0*  MCHC 31.3 31.0 30.5  RDW 15.4 15.6* 16.4*  LYMPHSABS 1.8 1.7  --   MONOABS 1.0 0.9  --   EOSABS 0.2 0.2  --   BASOSABS 0.0 0.0  --     Chemistries  Recent Labs  Lab 08/08/17 2322 08/09/17 0733 08/11/17 0551  NA 143 141 138  K 3.2* 3.3* 4.1  CL 106 105 104  CO2 27 25 23   GLUCOSE 126* 109* 98  BUN 20 16 25*  CREATININE 1.19* 1.03* 1.22*  CALCIUM 9.3 8.9 9.1  MG  --  2.5*  --   AST 27  --   --   ALT 23  --   --   ALKPHOS 73  --   --   BILITOT 0.7  --   --    ------------------------------------------------------------------------------------------------------------------ No results for input(s): CHOL, HDL, LDLCALC, TRIG, CHOLHDL, LDLDIRECT in the last 72 hours.  Lab Results  Component Value Date   HGBA1C 6.3 (H) 12/26/2014   ------------------------------------------------------------------------------------------------------------------ No results for input(s): TSH, T4TOTAL, T3FREE, THYROIDAB in the last 72 hours.  Invalid input(s): FREET3 ------------------------------------------------------------------------------------------------------------------ No results for input(s): VITAMINB12, FOLATE, FERRITIN, TIBC, IRON, RETICCTPCT in the last 72 hours.  Coagulation profile No results for input(s): INR, PROTIME in the last 168 hours.  No results for input(s): DDIMER in the  last 72 hours.  Cardiac Enzymes No results for input(s): CKMB, TROPONINI, MYOGLOBIN in the last 168 hours.  Invalid input(s): CK ------------------------------------------------------------------------------------------------------------------ No results found for: BNP  Inpatient Medications  Scheduled Meds: . aspirin  81 mg Oral Daily  . atorvastatin  40 mg Oral q1800  . cefUROXime  250 mg Oral BID WC  . cholecalciferol  2,000 Units Oral Daily  . clopidogrel  75 mg Oral QHS  . docusate sodium  100 mg Oral Daily  . enoxaparin (LOVENOX) injection  30 mg Subcutaneous Q24H  . levothyroxine  100 mcg Oral QAC breakfast  . metoprolol tartrate  25 mg Oral BID  . misoprostol  100 mcg Oral BID  . polyethylene glycol  17 g Oral Daily  . potassium chloride  20 mEq Oral Daily  . QUEtiapine  25 mg Oral BID  . vitamin B-12  1,000 mcg Oral Daily   Continuous Infusions: PRN Meds:.acetaminophen **OR** acetaminophen, bisacodyl, haloperidol lactate, hydrALAZINE, ondansetron **OR** ondansetron (ZOFRAN) IV, senna-docusate  Micro Results Recent Results (from the past 240 hour(s))  Urine Culture     Status: Abnormal   Collection Time: 08/08/17 11:50 PM  Result Value Ref Range Status   Specimen Description URINE, CLEAN CATCH  Final   Special Requests NONE  Final   Culture 70,000 COLONIES/mL PROTEUS MIRABILIS (A)  Final   Report Status 08/11/2017 FINAL  Final   Organism ID, Bacteria PROTEUS MIRABILIS (A)  Final      Susceptibility   Proteus mirabilis - MIC*    AMPICILLIN <=2 SENSITIVE Sensitive     CEFAZOLIN <=4 SENSITIVE Sensitive     CEFTRIAXONE <=1 SENSITIVE Sensitive     CIPROFLOXACIN >=4 RESISTANT Resistant     GENTAMICIN 8 INTERMEDIATE Intermediate     IMIPENEM 0.5 SENSITIVE Sensitive     NITROFURANTOIN >=512 RESISTANT Resistant     TRIMETH/SULFA >=320 RESISTANT Resistant     AMPICILLIN/SULBACTAM <=2 SENSITIVE Sensitive     PIP/TAZO <=4 SENSITIVE Sensitive     * 70,000 COLONIES/mL  PROTEUS MIRABILIS  Culture, blood (Routine X 2) w Reflex to ID Panel     Status: None (Preliminary result)   Collection Time: 08/09/17  1:06 AM  Result Value Ref Range Status   Specimen Description BLOOD LEFT ARM  Final   Special Requests   Final    BOTTLES DRAWN AEROBIC ONLY Blood Culture adequate volume   Culture NO GROWTH 4 DAYS  Final   Report Status PENDING  Incomplete  Culture, blood (Routine X 2) w Reflex to ID Panel     Status: None (Preliminary result)   Collection Time: 08/09/17  1:14 AM  Result Value Ref Range Status   Specimen Description RIGHT ANTECUBITAL  Final   Special Requests   Final    BOTTLES DRAWN AEROBIC ONLY Blood Culture adequate volume   Culture NO GROWTH 4 DAYS  Final   Report Status PENDING  Incomplete  MRSA PCR Screening     Status: None   Collection Time: 08/09/17  4:20 AM  Result Value Ref Range Status   MRSA by PCR  NEGATIVE NEGATIVE Final    Comment:        The GeneXpert MRSA Assay (FDA approved for NASAL specimens only), is one component of a comprehensive MRSA colonization surveillance program. It is not intended to diagnose MRSA infection nor to guide or monitor treatment for MRSA infections.     Radiology Reports Ct Head Wo Contrast  Result Date: 08/09/2017 CLINICAL DATA:  Altered mental status and combativeness. Recent treatment for urinary tract infection. EXAM: CT HEAD WITHOUT CONTRAST TECHNIQUE: Contiguous axial images were obtained from the base of the skull through the vertex without intravenous contrast. COMPARISON:  Head CT and CTA head 12/26/2014 FINDINGS: Brain: Motion artifact limits the examination. There is diffuse cerebral atrophy. Prominent ventricular dilatation consistent with central atrophy. Low-attenuation changes in the deep white matter consistent small vessel ischemia. There is diffuse encephalomalacia throughout the left frontal and parietal lobes, focally in the right frontal lobe, and throughout the right occipital  lobe. This is consistent with multiple old infarcts in the distributions of the left and right middle cerebral artery and in the right posterior cerebral artery. Although infarcts appear chronic, they are new or progressed since the previous study. No mass-effect or midline shift. No abnormal extra-axial fluid collections. Basal cisterns are not effaced. No acute intracranial hemorrhage. Vascular: Intracranial arterial vascular calcifications are present. The basilar artery is dilated and tortuous. Skull: Calvarium appears intact.  No depressed skull fractures. Sinuses/Orbits: Mucosal thickening in the paranasal sinuses. No acute air-fluid levels. Mastoid air cells are not opacified. Other: None. IMPRESSION: No acute intracranial abnormalities. Old appearing infarcts bilaterally in the middle cerebral artery distribution and in the right posterior cerebral artery distribution. Diffuse atrophy and small vessel ischemic changes. Electronically Signed   By: Burman Nieves M.D.   On: 08/09/2017 00:47   Dg Chest Port 1 View  Result Date: 08/09/2017 CLINICAL DATA:  Altered mental status and combativeness. EXAM: PORTABLE CHEST 1 VIEW COMPARISON:  12/26/2014 FINDINGS: Cardiac pacemaker. Shallow inspiration. Cardiac enlargement. Mild diffuse interstitial pattern to the lungs may represent early interstitial edema. Coronary artery stents. No pleural effusions. No pneumothorax. No focal consolidation. Calcification of the aorta. Degenerative changes in the spine and shoulders. IMPRESSION: Shallow inspiration. Cardiac enlargement. Possible interstitial edema. Aortic atherosclerosis. Electronically Signed   By: Burman Nieves M.D.   On: 08/09/2017 00:48    Time Spent in minutes  25   Kenniyah Sasaki M.D on 08/13/2017 at 3:10 PM  Between 7am to 7pm - Pager - [phone]  After 7pm go to [email or web address] - password [facility]  [facility] -  Office  [phone]

## 2017-08-14 DIAGNOSIS — E876 Hypokalemia: Secondary | ICD-10-CM

## 2017-08-14 DIAGNOSIS — F0151 Vascular dementia with behavioral disturbance: Secondary | ICD-10-CM

## 2017-08-14 DIAGNOSIS — G9341 Metabolic encephalopathy: Secondary | ICD-10-CM

## 2017-08-14 DIAGNOSIS — I4891 Unspecified atrial fibrillation: Secondary | ICD-10-CM

## 2017-08-14 DIAGNOSIS — I10 Essential (primary) hypertension: Secondary | ICD-10-CM

## 2017-08-14 LAB — CULTURE, BLOOD (ROUTINE X 2)
CULTURE: NO GROWTH
Culture: NO GROWTH
SPECIAL REQUESTS: ADEQUATE
SPECIAL REQUESTS: ADEQUATE

## 2017-08-14 LAB — GLUCOSE, CAPILLARY
GLUCOSE-CAPILLARY: 88 mg/dL (ref 65–99)
Glucose-Capillary: 82 mg/dL (ref 65–99)
Glucose-Capillary: 96 mg/dL (ref 65–99)

## 2017-08-14 MED ORDER — CEFUROXIME AXETIL 250 MG PO TABS: 250.0000 mg | ORAL_TABLET | Freq: Two times a day (BID) | ORAL | 0 refills | Status: AC

## 2017-08-14 MED ORDER — QUETIAPINE FUMARATE 25 MG PO TABS: 25.0000 mg | ORAL_TABLET | Freq: Two times a day (BID) | ORAL | 0 refills | Status: DC

## 2017-08-14 MED ORDER — POLYETHYLENE GLYCOL 3350 17 G PO PACK: 17.0000 g | PACK | Freq: Every day | ORAL | 0 refills | Status: DC

## 2017-08-14 NOTE — Clinical Social Work Note (Addendum)
[facility] was informed of patient's discharge and discharge clinicals sent. She stated that the facility would transport.  Patient's daughter, Ms. Carey BullocksJarrell, notified patient of discharge.   LCSW signing off.     Rhet Rorke, Juleen ChinaHeather D, LCSW

## 2017-08-14 NOTE — Care Management Note (Signed)
Case Management Note  Patient Details  Name: [name] MRN: [id number] Date of Birth: 1931-01-15  If discussed at Long Length of Stay Meetings, dates discussed:   08/14/2017 Additional Comments:  Kaicen Desena, Chrystine OilerSharley Diane, RN 08/14/2017, 12:09 PM

## 2017-08-14 NOTE — NC FL2 (Signed)
[place] MEDICAID FL2 LEVEL OF CARE SCREENING TOOL     IDENTIFICATION  Patient Name: [name] Birthdate: 1930/08/04 Sex: female Admission Date (Current Location): 08/08/2017  [facility] and [place] Number:  [employer] and Address:  [facility],  [address] [place] [address]      Provider Number: [id number]  Attending Physician Name and Address:  Eddie North, MD  Relative Name and Phone Number:       Current Level of Care: Hospital Recommended Level of Care: [facility] Prior Approval Number:    Date Approved/Denied:   PASRR Number: [id number]  Discharge Plan: Other (Comment)([facility])    Current Diagnoses: Patient Active Problem List   Diagnosis Date Noted  . Proteus mirabilis infection/UTI 08/10/2017  . AF (paroxysmal atrial fibrillation) (HCC) 08/09/2017  . Hypokalemia 08/09/2017  . Toxic Acute encephalopathy due to UTI 08/09/2017  . CKD (chronic kidney disease), stage III (HCC) 08/09/2017  . CAD in native artery 03/15/2015  . Cerebrovascular accident, old 03/15/2015  . Hypothyroidism due to Hashimoto's thyroiditis 03/15/2015  . Neuropathy 03/15/2015  . Avitaminosis D 03/15/2015  . Hypercholesterolemia without hypertriglyceridemia 03/15/2015  . Pain in joint, shoulder region 02/13/2015  . Cellulitis 01/28/2015  . Renal insufficiency 01/06/2015  . Hypothyroidism 12/28/2014  . Expressive aphasia 12/28/2014  . Acute ischemic left MCA stroke (HCC) 12/26/2014  . Exertional dyspnea 05/23/2014  . Spider veins of both lower extremities 05/10/2014  . Varicose veins of leg with complications 05/10/2014  . ICD (implantable cardioverter-defibrillator) in place 04/22/2014  . VF (ventricular fibrillation) (HCC) 03/29/2014  . Mixed hyperlipidemia 04/21/2013  . Chronic systolic heart failure (HCC) 03/26/2013  . Ischemic cardiomyopathy   . Coronary atherosclerosis of native coronary artery   . COPD 02/27/2009  . SPINAL  STENOSIS 02/27/2009  . Essential hypertension, benign 02/01/2009  . Automatic implantable cardioverter-defibrillator in situ 02/01/2009    Orientation RESPIRATION BLADDER Height & Weight     Self  Normal Incontinent Weight: 133 lb 6.1 oz (60.5 kg) Height:  5' (152.4 cm)  BEHAVIORAL SYMPTOMS/MOOD NEUROLOGICAL BOWEL NUTRITION STATUS      Incontinent (Heart Healthy)  AMBULATORY STATUS COMMUNICATION OF NEEDS Skin   Limited Assist Verbally Normal                       Personal Care Assistance Level of Assistance  Bathing, Feeding, Dressing Bathing Assistance: Limited assistance Feeding assistance: Independent Dressing Assistance: Limited assistance     Functional Limitations Info             SPECIAL CARE FACTORS FREQUENCY  PT (By licensed PT)     PT Frequency: 5x/week              Contractures Contractures Info: Not present    Additional Factors Info  Psychotropic Code Status Info: Full code Allergies Info: Nsaids, Oxycodone Hcl, Povidone-iodine, Prednisone, Amoxicillian-potClavulante, Betadine, Doxazosin Mesylate, Cephalexin, Cymbalta, Gabapentin, Neomycin, Tape Psychotropic Info: Seroquel         Current Medications (08/14/2017):  This is the current hospital active medication list Current Facility-Administered Medications  Medication Dose Route Frequency Provider Last Rate Last Dose  . acetaminophen (TYLENOL) tablet 650 mg  650 mg Oral Q6H PRN Opyd, Lavone Neri, MD   650 mg at 08/12/17 1253   Or  . acetaminophen (TYLENOL) suppository 650 mg  650 mg Rectal Q6H PRN Opyd, Lavone Neri, MD      . aspirin chewable tablet 81 mg  81 mg  Oral Daily Opyd, Lavone Neri, MD   81 mg at 08/14/17 1122  . atorvastatin (LIPITOR) tablet 40 mg  40 mg Oral q1800 Opyd, Lavone Neri, MD   40 mg at 08/13/17 1600  . bisacodyl (DULCOLAX) EC tablet 5 mg  5 mg Oral Daily PRN Opyd, Lavone Neri, MD      . cefUROXime (CEFTIN) tablet 250 mg  250 mg Oral BID WC Dhungel, Nishant, MD   250 mg at  08/14/17 0747  . cholecalciferol (VITAMIN D) tablet 2,000 Units  2,000 Units Oral Daily Opyd, Lavone Neri, MD   2,000 Units at 08/14/17 1121  . clopidogrel (PLAVIX) tablet 75 mg  75 mg Oral QHS Briscoe Deutscher, MD   75 mg at 08/11/17 2124  . docusate sodium (COLACE) capsule 100 mg  100 mg Oral Daily Opyd, Lavone Neri, MD   100 mg at 08/14/17 1121  . enoxaparin (LOVENOX) injection 30 mg  30 mg Subcutaneous Q24H Dhungel, Nishant, MD   30 mg at 08/14/17 1122  . haloperidol lactate (HALDOL) injection 5 mg  5 mg Intramuscular Q6H PRN Emokpae, Courage, MD   5 mg at 08/14/17 0200  . hydrALAZINE (APRESOLINE) injection 10 mg  10 mg Intravenous Q4H PRN Opyd, Lavone Neri, MD   10 mg at 08/10/17 0223  . levothyroxine (SYNTHROID, LEVOTHROID) tablet 100 mcg  100 mcg Oral QAC breakfast Opyd, Lavone Neri, MD   100 mcg at 08/14/17 0747  . metoprolol tartrate (LOPRESSOR) tablet 25 mg  25 mg Oral BID Mariea Clonts, Courage, MD   25 mg at 08/14/17 1121  . misoprostol (CYTOTEC) tablet 100 mcg  100 mcg Oral BID Briscoe Deutscher, MD   100 mcg at 08/14/17 1121  . ondansetron (ZOFRAN) tablet 4 mg  4 mg Oral Q6H PRN Opyd, Lavone Neri, MD       Or  . ondansetron (ZOFRAN) injection 4 mg  4 mg Intravenous Q6H PRN Opyd, Lavone Neri, MD      . polyethylene glycol (MIRALAX / GLYCOLAX) packet 17 g  17 g Oral Daily Dhungel, Nishant, MD   17 g at 08/14/17 1123  . potassium chloride SA (K-DUR,KLOR-CON) CR tablet 20 mEq  20 mEq Oral Daily Opyd, Lavone Neri, MD   20 mEq at 08/14/17 1121  . QUEtiapine (SEROQUEL) tablet 25 mg  25 mg Oral BID Dhungel, Nishant, MD   25 mg at 08/14/17 1122  . senna-docusate (Senokot-S) tablet 1 tablet  1 tablet Oral QHS PRN Opyd, Lavone Neri, MD      . vitamin B-12 (CYANOCOBALAMIN) tablet 1,000 mcg  1,000 mcg Oral Daily Opyd, Lavone Neri, MD   1,000 mcg at 08/14/17 1122     Discharge Medications: Medication List     STOP taking these medications   ciprofloxacin 250 MG tablet Commonly known as:  CIPRO   sertraline 50 MG  tablet Commonly known as:  ZOLOFT     TAKE these medications   acetaminophen 650 MG CR tablet Commonly known as:  TYLENOL Take 650 mg by mouth every 8 (eight) hours as needed for pain.   aspirin 81 MG chewable tablet Chew 81 mg by mouth daily.   atorvastatin 40 MG tablet Commonly known as:  LIPITOR Take 40 mg by mouth at bedtime.   cefUROXime 250 MG tablet Commonly known as:  CEFTIN Take 1 tablet (250 mg total) by mouth 2 (two) times daily with a meal for 2 days.   clopidogrel 75 MG tablet Commonly known as:  PLAVIX Take  75 mg by mouth at bedtime.   docusate sodium 100 MG capsule Commonly known as:  COLACE Take 100 mg by mouth daily.   Melatonin 10 MG Tabs Take 10 mg by mouth every evening.   metoprolol tartrate 25 MG tablet Commonly known as:  LOPRESSOR Take 25 mg by mouth 2 (two) times daily.   misoprostol 100 MCG tablet Commonly known as:  CYTOTEC Take 100 mcg by mouth 2 (two) times daily.   polyethylene glycol packet Commonly known as:  MIRALAX / GLYCOLAX Take 17 g by mouth daily.   potassium chloride 10 MEQ tablet Commonly known as:  K-DUR,KLOR-CON Take 10 mEq by mouth daily.   QUEtiapine 25 MG tablet Commonly known as:  SEROQUEL Take 1 tablet (25 mg total) by mouth 2 (two) times daily.   SYNTHROID 100 MCG tablet Generic drug:  levothyroxine Take 100 mcg by mouth daily before breakfast.   vitamin B-12 1000 MCG tablet Commonly known as:  CYANOCOBALAMIN Take 1,000 mcg by mouth daily.   Vitamin D 2000 units Caps Take 2,000 Units by mouth daily.         Relevant Imaging Results:  Relevant Lab Results:   Additional Information SSN [id number]   Tiajah Oyster, Juleen ChinaHeather D, LCSW

## 2017-08-14 NOTE — Discharge Summary (Deleted)
Physician Discharge Summary  [name] ZOX:[id number] DOB: [birth year] DOA: 08/08/2017  PCP: Richardean Chimera, MD  Admit date: 08/08/2017 Discharge date: 08/14/2017  Admitted From: Group home Disposition:  Group home  Recommendations for Outpatient Follow-up:  #1. Return to group home. Patient will complete 7 day course 2/2. #2. Follow-up With PCP in 1 week. Please avoid benzodiazepines or narcotics if Possible.  Home Health:Resume PT at the facility Equipment/Devices:None  Discharge Condition:fair CODE STATUS:DO NOT RESUSCITATE Diet recommendation: Heart healthy    Discharge Diagnoses:  Principal Problem:   Proteus mirabilis infection/UTI  Active Problems:   Toxic Acute encephalopathy due to UTI   Essential hypertension, benign   Automatic implantable cardioverter-defibrillator in situ   Hypothyroidism   CAD in native artery   Cerebrovascular accident, old   AF (paroxysmal atrial fibrillation) (HCC)   Hypokalemia   CKD (chronic kidney disease), stage III (HCC)  Brief narrative/history of present Illness Please refer to admission H&P  For details, in brief,84 year old female with history of CVA with residual aphasia, CAD, ischemic myopathy with ICD, hypothyroidism and PAF not on anticoagulation presented from assisted living with increasing confusion with agitation and combativeness. Patient found to have Proteus UTI.  Hospital course  Toxic Acute encephalopathy due to UTI Secondary to Proteus.  Antibiotic switched to Ceftin.  Place back on sequel for agitation, increase dose, d/c zoloft.  Head CT and negative for acute stroke. TSH of 3.8.Normal ammonia and B12.  RPR nonreactive. Avoid narcotics and benzos.  Active Problems: Multi-infarct dementia with acute delirium. Likely contributed by progressive dementia with UTI and hospital setting causing sundowning. Started on Seroquel which will be continued upon discharge.  Continue aspirin, Plavix and statin. Head CT  without acute findings.  Ischemic cardiomyopathy/CAD Euvolemic. Continue aspirin, Plavix, beta blocker and statin.    paroxysmal A. fib Rate controlled. Continue metoprolol. On aspirin and Plavix. Her CHADS2Vac is 8, not on anticoagulation? Possibly due to fall risk.  Hypokalemia Replenished    Automatic implantable cardioverter-defibrillator in situ    CKD (chronic kidney disease), stage III (HCC) Renal Function stable.  Hypothyroidism   TSH of 3.8. Continue Synthroid.  Physical deconditioning Seen by PT and recommends SNF. She gets PT at her group home and has 24-hour supervision.  Family insisted on sending her back to the group home.    Family Communication  :  Discussed with son and daughter while in the hospital  Disposition Plan  : Return to group home      Consults  : None  Procedures  : CT head      Procedures/Studies: Ct Head Wo Contrast  Result Date: 08/09/2017 CLINICAL DATA:  Altered mental status and combativeness. Recent treatment for urinary tract infection. EXAM: CT HEAD WITHOUT CONTRAST TECHNIQUE: Contiguous axial images were obtained from the base of the skull through the vertex without intravenous contrast. COMPARISON:  Head CT and CTA head 12/26/2014 FINDINGS: Brain: Motion artifact limits the examination. There is diffuse cerebral atrophy. Prominent ventricular dilatation consistent with central atrophy. Low-attenuation changes in the deep white matter consistent small vessel ischemia. There is diffuse encephalomalacia throughout the left frontal and parietal lobes, focally in the right frontal lobe, and throughout the right occipital lobe. This is consistent with multiple old infarcts in the distributions of the left and right middle cerebral artery and in the right posterior cerebral artery. Although infarcts appear chronic, they are new or progressed since the previous study. No mass-effect or midline shift. No abnormal extra-axial fluid  collections.  Basal cisterns are not effaced. No acute intracranial hemorrhage. Vascular: Intracranial arterial vascular calcifications are present. The basilar artery is dilated and tortuous. Skull: Calvarium appears intact.  No depressed skull fractures. Sinuses/Orbits: Mucosal thickening in the paranasal sinuses. No acute air-fluid levels. Mastoid air cells are not opacified. Other: None. IMPRESSION: No acute intracranial abnormalities. Old appearing infarcts bilaterally in the middle cerebral artery distribution and in the right posterior cerebral artery distribution. Diffuse atrophy and small vessel ischemic changes. Electronically Signed   By: Burman NievesWilliam  Stevens M.D.   On: 08/09/2017 00:47   Dg Chest Port 1 View  Result Date: 08/09/2017 CLINICAL DATA:  Altered mental status and combativeness. EXAM: PORTABLE CHEST 1 VIEW COMPARISON:  12/26/2014 FINDINGS: Cardiac pacemaker. Shallow inspiration. Cardiac enlargement. Mild diffuse interstitial pattern to the lungs may represent early interstitial edema. Coronary artery stents. No pleural effusions. No pneumothorax. No focal consolidation. Calcification of the aorta. Degenerative changes in the spine and shoulders. IMPRESSION: Shallow inspiration. Cardiac enlargement. Possible interstitial edema. Aortic atherosclerosis. Electronically Signed   By: Burman NievesWilliam  Stevens M.D.   On: 08/09/2017 00:48       Subjective: Required Haldol for agitation last night.  Confused this morning but seems around baseline.  Discharge Exam: Vitals:   08/13/17 1300 08/13/17 2035  BP: (!) 169/94 (!) 162/82  Pulse: 77 83  Resp: 18 20  Temp: 98.4 F (36.9 C)   SpO2: 100% 94%   Vitals:   08/13/17 0400 08/13/17 0803 08/13/17 1300 08/13/17 2035  BP: (!) 134/96  (!) 169/94 (!) 162/82  Pulse: 84  77 83  Resp:   18 20  Temp: 97.8 F (36.6 C)  98.4 F (36.9 C)   TempSrc: Axillary  Oral   SpO2: 96% 94% 100% 94%  Weight: 60.5 kg (133 lb 6.1 oz)     Height:        Gen:  Elderly female, confused, not in distress HEENT: Moist mucosa, supple neck Chest: Clear bilaterally CVS: S1-S2 irregular GI: Soft, nondistended, nontender Musculoskeletal: Warm, no edema CNS: Alert and awake, confused      The results of significant diagnostics from this hospitalization (including imaging, microbiology, ancillary and laboratory) are listed below for reference.     Microbiology: Recent Results (from the past 240 hour(s))  Urine Culture     Status: Abnormal   Collection Time: 08/08/17 11:50 PM  Result Value Ref Range Status   Specimen Description URINE, CLEAN CATCH  Final   Special Requests NONE  Final   Culture 70,000 COLONIES/mL PROTEUS MIRABILIS (A)  Final   Report Status 08/11/2017 FINAL  Final   Organism ID, Bacteria PROTEUS MIRABILIS (A)  Final      Susceptibility   Proteus mirabilis - MIC*    AMPICILLIN <=2 SENSITIVE Sensitive     CEFAZOLIN <=4 SENSITIVE Sensitive     CEFTRIAXONE <=1 SENSITIVE Sensitive     CIPROFLOXACIN >=4 RESISTANT Resistant     GENTAMICIN 8 INTERMEDIATE Intermediate     IMIPENEM 0.5 SENSITIVE Sensitive     NITROFURANTOIN >=512 RESISTANT Resistant     TRIMETH/SULFA >=320 RESISTANT Resistant     AMPICILLIN/SULBACTAM <=2 SENSITIVE Sensitive     PIP/TAZO <=4 SENSITIVE Sensitive     * 70,000 COLONIES/mL PROTEUS MIRABILIS  Culture, blood (Routine X 2) w Reflex to ID Panel     Status: None   Collection Time: 08/09/17  1:06 AM  Result Value Ref Range Status   Specimen Description BLOOD LEFT ARM  Final   Special Requests  Final    BOTTLES DRAWN AEROBIC ONLY Blood Culture adequate volume   Culture NO GROWTH 5 DAYS  Final   Report Status 08/14/2017 FINAL  Final  Culture, blood (Routine X 2) w Reflex to ID Panel     Status: None   Collection Time: 08/09/17  1:14 AM  Result Value Ref Range Status   Specimen Description RIGHT ANTECUBITAL  Final   Special Requests   Final    BOTTLES DRAWN AEROBIC ONLY Blood Culture adequate volume    Culture NO GROWTH 5 DAYS  Final   Report Status 08/14/2017 FINAL  Final  MRSA PCR Screening     Status: None   Collection Time: 08/09/17  4:20 AM  Result Value Ref Range Status   MRSA by PCR NEGATIVE NEGATIVE Final    Comment:        The GeneXpert MRSA Assay (FDA approved for NASAL specimens only), is one component of a comprehensive MRSA colonization surveillance program. It is not intended to diagnose MRSA infection nor to guide or monitor treatment for MRSA infections.      Labs: BNP (last 3 results) No results for input(s): BNP in the last 8760 hours. Basic Metabolic Panel: Recent Labs  Lab 08/08/17 2322 08/09/17 0733 08/11/17 0551  NA 143 141 138  K 3.2* 3.3* 4.1  CL 106 105 104  CO2 27 25 23   GLUCOSE 126* 109* 98  BUN 20 16 25*  CREATININE 1.19* 1.03* 1.22*  CALCIUM 9.3 8.9 9.1  MG  --  2.5*  --    Liver Function Tests: Recent Labs  Lab 08/08/17 2322  AST 27  ALT 23  ALKPHOS 73  BILITOT 0.7  PROT 6.7  ALBUMIN 3.5   No results for input(s): LIPASE, AMYLASE in the last 168 hours. Recent Labs  Lab 08/09/17 0733  AMMONIA 14   CBC: Recent Labs  Lab 08/08/17 2322 08/09/17 0733 08/11/17 0551  WBC 8.7 8.4 8.7  NEUTROABS 5.8 5.6  --   HGB 10.8* 10.7* 11.2*  HCT 34.5* 34.5*  33.7* 36.7  MCV 82.1 82.7 81.9  PLT 225 221 202   Cardiac Enzymes: No results for input(s): CKTOTAL, CKMB, CKMBINDEX, TROPONINI in the last 168 hours. BNP: Invalid input(s): POCBNP CBG: Recent Labs  Lab 08/09/17 0753 08/10/17 0732 08/11/17 0725 08/13/17 0738  GLUCAP 94 91 132* 85   D-Dimer No results for input(s): DDIMER in the last 72 hours. Hgb A1c No results for input(s): HGBA1C in the last 72 hours. Lipid Profile No results for input(s): CHOL, HDL, LDLCALC, TRIG, CHOLHDL, LDLDIRECT in the last 72 hours. Thyroid function studies No results for input(s): TSH, T4TOTAL, T3FREE, THYROIDAB in the last 72 hours.  Invalid input(s): FREET3 Anemia work up No  results for input(s): VITAMINB12, FOLATE, FERRITIN, TIBC, IRON, RETICCTPCT in the last 72 hours. Urinalysis    Component Value Date/Time   COLORURINE YELLOW 08/08/2017 2350   APPEARANCEUR CLOUDY (A) 08/08/2017 2350   LABSPEC 1.019 08/08/2017 2350   PHURINE 7.0 08/08/2017 2350   GLUCOSEU NEGATIVE 08/08/2017 2350   HGBUR NEGATIVE 08/08/2017 2350   BILIRUBINUR NEGATIVE 08/08/2017 2350   KETONESUR NEGATIVE 08/08/2017 2350   PROTEINUR 100 (A) 08/08/2017 2350   UROBILINOGEN 1.0 12/30/2014 1930   NITRITE NEGATIVE 08/08/2017 2350   LEUKOCYTESUR LARGE (A) 08/08/2017 2350   Sepsis Labs Invalid input(s): PROCALCITONIN,  WBC,  LACTICIDVEN Microbiology Recent Results (from the past 240 hour(s))  Urine Culture     Status: Abnormal   Collection Time: 08/08/17  11:50 PM  Result Value Ref Range Status   Specimen Description URINE, CLEAN CATCH  Final   Special Requests NONE  Final   Culture 70,000 COLONIES/mL PROTEUS MIRABILIS (A)  Final   Report Status 08/11/2017 FINAL  Final   Organism ID, Bacteria PROTEUS MIRABILIS (A)  Final      Susceptibility   Proteus mirabilis - MIC*    AMPICILLIN <=2 SENSITIVE Sensitive     CEFAZOLIN <=4 SENSITIVE Sensitive     CEFTRIAXONE <=1 SENSITIVE Sensitive     CIPROFLOXACIN >=4 RESISTANT Resistant     GENTAMICIN 8 INTERMEDIATE Intermediate     IMIPENEM 0.5 SENSITIVE Sensitive     NITROFURANTOIN >=512 RESISTANT Resistant     TRIMETH/SULFA >=320 RESISTANT Resistant     AMPICILLIN/SULBACTAM <=2 SENSITIVE Sensitive     PIP/TAZO <=4 SENSITIVE Sensitive     * 70,000 COLONIES/mL PROTEUS MIRABILIS  Culture, blood (Routine X 2) w Reflex to ID Panel     Status: None   Collection Time: 08/09/17  1:06 AM  Result Value Ref Range Status   Specimen Description BLOOD LEFT ARM  Final   Special Requests   Final    BOTTLES DRAWN AEROBIC ONLY Blood Culture adequate volume   Culture NO GROWTH 5 DAYS  Final   Report Status 08/14/2017 FINAL  Final  Culture, blood (Routine X  2) w Reflex to ID Panel     Status: None   Collection Time: 08/09/17  1:14 AM  Result Value Ref Range Status   Specimen Description RIGHT ANTECUBITAL  Final   Special Requests   Final    BOTTLES DRAWN AEROBIC ONLY Blood Culture adequate volume   Culture NO GROWTH 5 DAYS  Final   Report Status 08/14/2017 FINAL  Final  MRSA PCR Screening     Status: None   Collection Time: 08/09/17  4:20 AM  Result Value Ref Range Status   MRSA by PCR NEGATIVE NEGATIVE Final    Comment:        The GeneXpert MRSA Assay (FDA approved for NASAL specimens only), is one component of a comprehensive MRSA colonization surveillance program. It is not intended to diagnose MRSA infection nor to guide or monitor treatment for MRSA infections.      Time coordinating discharge: Over 30 minutes  SIGNED:   Eddie North, MD  [facility] 08/14/2017, 10:29 AM Pager   If 7PM-7AM, please contact night-coverage [email or web address] Password [email or web address]

## 2017-08-14 NOTE — Care Management Note (Signed)
Case Management Note  Patient Details  Name: [name] MRN: [id number] Date of Birth: 02-02-1931     Expected Discharge Date:  08/14/17               Expected Discharge Plan:  Assisted Living / Rest Home  In-House Referral:  Clinical Social Work  Discharge planning Services  CM Consult  Post Acute Care Choice:  Home Health Choice offered to:  [facility]  DME Arranged:    DME Agency:     HH Arranged:  RN, PT, OT, Speech Therapy HH Agency:  [facility]  Status of Service:  Completed, signed off  If discussed at Long Length of Stay Meetings, dates discussed:    Additional Comments: [facility] CS[facility] facilitating [facility]. [facility] of [facility] and RN. Katrina of [facility] notified of DC and will obtain orders from Epic. Ilayda Toda, Chrystine OilerSharley Diane, RN 08/14/2017, 11:03 AM

## 2017-08-14 NOTE — Discharge Summary (Signed)
Physician Discharge Summary  NHYLA NAPPI ZOX:[id number] DOB: 1931-06-02 DOA: 08/08/2017  PCP: Richardean Chimera, MD  Admit date: 08/08/2017 Discharge date: 08/14/2017  Admitted From: Group home Disposition:  Group home  Recommendations for Outpatient Follow-up:  #1. Return to group home. Patient will complete 7 day course 2/2. #2. Follow-up With PCP in 1 week. Please avoid benzodiazepines or narcotics if Possible.  Home Health:Resume PT at the facility Equipment/Devices:None  Discharge Condition:fair CODE STATUS:DO NOT RESUSCITATE Diet recommendation: Heart healthy    Discharge Diagnoses:  Principal Problem:   Proteus mirabilis infection/UTI  Active Problems:   Toxic Acute encephalopathy due to UTI   Essential hypertension, benign   Automatic implantable cardioverter-defibrillator in situ   Hypothyroidism   CAD in native artery   Cerebrovascular accident, old   AF (paroxysmal atrial fibrillation) (HCC)   Hypokalemia   CKD (chronic kidney disease), stage III (HCC)  Brief narrative/history of present Illness Please refer to admission H&P  For details, in brief,84 year old female with history of CVA with residual aphasia, CAD, ischemic myopathy with ICD, hypothyroidism and PAF not on anticoagulation presented from assisted living with increasing confusion with agitation and combativeness. Patient found to have Proteus UTI.  Hospital course  Toxic Acute encephalopathy due to UTI Secondary to Proteus.  Antibiotic switched to Ceftin.  Place back on sequel for agitation, increase dose, d/c zoloft.  Head CT and negative for acute stroke. TSH of 3.8.Normal ammonia and B12.  RPR nonreactive. Avoid narcotics and benzos.  Active Problems: Multi-infarct dementia with acute delirium. Likely contributed by progressive dementia with UTI and hospital setting causing sundowning. Started on Seroquel which will be continued upon discharge.  Continue aspirin, Plavix and statin. Head CT  without acute findings.  Ischemic cardiomyopathy/CAD Euvolemic. Continue aspirin, Plavix, beta blocker and statin.    paroxysmal A. fib Rate controlled. Continue metoprolol. On aspirin and Plavix. Her CHADS2Vac is 8, not on anticoagulation? Possibly due to fall risk.  Hypokalemia Replenished    Automatic implantable cardioverter-defibrillator in situ    CKD (chronic kidney disease), stage III (HCC) Renal Function stable.  Hypothyroidism   TSH of 3.8. Continue Synthroid.  Physical deconditioning Seen by PT and recommends SNF. She gets PT at her group home and has 24-hour supervision.  Family insisted on sending her back to the group home.    Family Communication  :  Discussed with son and daughter while in the hospital  Disposition Plan  : Return to group home      Consults  : None  Procedures  : CT head      Discharge Instructions   Allergies as of 08/14/2017      Reactions   Nsaids Other (See Comments)   Vomited blood   Oxycodone Hcl Hives, Rash   Povidone-iodine Rash   Prednisone Nausea Only, Other (See Comments)   FLU SYMPTOMS   Amoxicillin-pot Clavulanate Other (See Comments)   VOMITING   Betadine [povidone Iodine] Rash   rash   Doxazosin Mesylate Other (See Comments)   INCONTINENCE   Cephalexin Rash   Cymbalta [duloxetine Hcl] Nausea Only, Other (See Comments)   Headache   Gabapentin Rash   Neomycin Rash   Tape Other (See Comments)   Redness      Medication List    STOP taking these medications   ciprofloxacin 250 MG tablet Commonly known as:  CIPRO   sertraline 50 MG tablet Commonly known as:  ZOLOFT     TAKE these medications   acetaminophen 650  MG CR tablet Commonly known as:  TYLENOL Take 650 mg by mouth every 8 (eight) hours as needed for pain.   aspirin 81 MG chewable tablet Chew 81 mg by mouth daily.   atorvastatin 40 MG tablet Commonly known as:  LIPITOR Take 40 mg by mouth at bedtime.   cefUROXime 250 MG  tablet Commonly known as:  CEFTIN Take 1 tablet (250 mg total) by mouth 2 (two) times daily with a meal for 2 days.   clopidogrel 75 MG tablet Commonly known as:  PLAVIX Take 75 mg by mouth at bedtime.   docusate sodium 100 MG capsule Commonly known as:  COLACE Take 100 mg by mouth daily.   Melatonin 10 MG Tabs Take 10 mg by mouth every evening.   metoprolol tartrate 25 MG tablet Commonly known as:  LOPRESSOR Take 25 mg by mouth 2 (two) times daily.   misoprostol 100 MCG tablet Commonly known as:  CYTOTEC Take 100 mcg by mouth 2 (two) times daily.   polyethylene glycol packet Commonly known as:  MIRALAX / GLYCOLAX Take 17 g by mouth daily.   potassium chloride 10 MEQ tablet Commonly known as:  K-DUR,KLOR-CON Take 10 mEq by mouth daily.   QUEtiapine 25 MG tablet Commonly known as:  SEROQUEL Take 1 tablet (25 mg total) by mouth 2 (two) times daily.   SYNTHROID 100 MCG tablet Generic drug:  levothyroxine Take 100 mcg by mouth daily before breakfast.   vitamin B-12 1000 MCG tablet Commonly known as:  CYANOCOBALAMIN Take 1,000 mcg by mouth daily.   Vitamin D 2000 units Caps Take 2,000 Units by mouth daily.       Contact information for follow-up providers    [name], Terry G, MD Follow up in 1 week(s).   Specialty:  Family Medicine Contact information: [address] [place] [address] [phone]            Contact information for after-discharge care    Destination    [facility].   Service:  Group Home Contact information: [address] [place] [id number] [phone]                 Allergies  Allergen Reactions  . Nsaids Other (See Comments)    Vomited blood   . Oxycodone Hcl Hives and Rash  . Povidone-Iodine Rash  . Prednisone Nausea Only and Other (See Comments)    FLU SYMPTOMS  . Amoxicillin-Pot Clavulanate Other (See Comments)    VOMITING  . Betadine [Povidone Iodine] Rash    rash   . Doxazosin Mesylate Other (See Comments)    INCONTINENCE   . Cephalexin Rash  . Cymbalta [Duloxetine Hcl] Nausea Only and Other (See Comments)    Headache  . Gabapentin Rash  . Neomycin Rash  . Tape Other (See Comments)    Redness     Procedures/Studies: Ct Head Wo Contrast  Result Date: 08/09/2017 CLINICAL DATA:  Altered mental status and combativeness. Recent treatment for urinary tract infection. EXAM: CT HEAD WITHOUT CONTRAST TECHNIQUE: Contiguous axial images were obtained from the base of the skull through the vertex without intravenous contrast. COMPARISON:  Head CT and CTA head 12/26/2014 FINDINGS: Brain: Motion artifact limits the examination. There is diffuse cerebral atrophy. Prominent ventricular dilatation consistent with central atrophy. Low-attenuation changes in the deep white matter consistent small vessel ischemia. There is diffuse encephalomalacia throughout the left frontal and parietal lobes, focally in the right frontal lobe, and  throughout the right occipital lobe. This is consistent with multiple old infarcts in the distributions of the left and right middle cerebral artery and in the right posterior cerebral artery. Although infarcts appear chronic, they are new or progressed since the previous study. No mass-effect or midline shift. No abnormal extra-axial fluid collections. Basal cisterns are not effaced. No acute intracranial hemorrhage. Vascular: Intracranial arterial vascular calcifications are present. The basilar artery is dilated and tortuous. Skull: Calvarium appears intact.  No depressed skull fractures. Sinuses/Orbits: Mucosal thickening in the paranasal sinuses. No acute air-fluid levels. Mastoid air cells are not opacified. Other: None. IMPRESSION: No acute intracranial abnormalities. Old appearing infarcts bilaterally in the middle cerebral artery distribution and in the right posterior cerebral artery distribution. Diffuse atrophy and small vessel ischemic  changes. Electronically Signed   By: Burman Nieves M.D.   On: 08/09/2017 00:47   Dg Chest Port 1 View  Result Date: 08/09/2017 CLINICAL DATA:  Altered mental status and combativeness. EXAM: PORTABLE CHEST 1 VIEW COMPARISON:  12/26/2014 FINDINGS: Cardiac pacemaker. Shallow inspiration. Cardiac enlargement. Mild diffuse interstitial pattern to the lungs may represent early interstitial edema. Coronary artery stents. No pleural effusions. No pneumothorax. No focal consolidation. Calcification of the aorta. Degenerative changes in the spine and shoulders. IMPRESSION: Shallow inspiration. Cardiac enlargement. Possible interstitial edema. Aortic atherosclerosis. Electronically Signed   By: Burman Nieves M.D.   On: 08/09/2017 00:48       Subjective: Required Haldol for agitation last night.  Confused this morning but seems around baseline.     Discharge Exam: Vitals:   08/13/17 2035 08/14/17 1120  BP: (!) 162/82 (!) 163/83  Pulse: 83 80  Resp: 20   Temp:    SpO2: 94%    Vitals:   08/13/17 0803 08/13/17 1300 08/13/17 2035 08/14/17 1120  BP:  (!) 169/94 (!) 162/82 (!) 163/83  Pulse:  77 83 80  Resp:  18 20   Temp:  98.4 F (36.9 C)    TempSrc:  Oral    SpO2: 94% 100% 94%   Weight:      Height:        WUJ:WJXBJYN female, confused, not in distress HEENT: Moist mucosa, supple neck Chest: Clear bilaterally CVS: S1-S2 irregular GI: Soft, nondistended, nontender Musculoskeletal: Warm, no edema CNS: Alert and awake, confused      The results of significant diagnostics from this hospitalization (including imaging, microbiology, ancillary and laboratory) are listed below for reference.     Microbiology: Recent Results (from the past 240 hour(s))  Urine Culture     Status: Abnormal   Collection Time: 08/08/17 11:50 PM  Result Value Ref Range Status   Specimen Description URINE, CLEAN CATCH  Final   Special Requests NONE  Final   Culture 70,000 COLONIES/mL PROTEUS  MIRABILIS (A)  Final   Report Status 08/11/2017 FINAL  Final   Organism ID, Bacteria PROTEUS MIRABILIS (A)  Final      Susceptibility   Proteus mirabilis - MIC*    AMPICILLIN <=2 SENSITIVE Sensitive     CEFAZOLIN <=4 SENSITIVE Sensitive     CEFTRIAXONE <=1 SENSITIVE Sensitive     CIPROFLOXACIN >=4 RESISTANT Resistant     GENTAMICIN 8 INTERMEDIATE Intermediate     IMIPENEM 0.5 SENSITIVE Sensitive     NITROFURANTOIN >=512 RESISTANT Resistant     TRIMETH/SULFA >=320 RESISTANT Resistant     AMPICILLIN/SULBACTAM <=2 SENSITIVE Sensitive     PIP/TAZO <=4 SENSITIVE Sensitive     * 70,000 COLONIES/mL PROTEUS MIRABILIS  Culture, blood (Routine X 2) w Reflex to ID Panel     Status: None   Collection Time: 08/09/17  1:06 AM  Result Value Ref Range Status   Specimen Description BLOOD LEFT ARM  Final   Special Requests   Final    BOTTLES DRAWN AEROBIC ONLY Blood Culture adequate volume   Culture NO GROWTH 5 DAYS  Final   Report Status 08/14/2017 FINAL  Final  Culture, blood (Routine X 2) w Reflex to ID Panel     Status: None   Collection Time: 08/09/17  1:14 AM  Result Value Ref Range Status   Specimen Description RIGHT ANTECUBITAL  Final   Special Requests   Final    BOTTLES DRAWN AEROBIC ONLY Blood Culture adequate volume   Culture NO GROWTH 5 DAYS  Final   Report Status 08/14/2017 FINAL  Final  MRSA PCR Screening     Status: None   Collection Time: 08/09/17  4:20 AM  Result Value Ref Range Status   MRSA by PCR NEGATIVE NEGATIVE Final    Comment:        The GeneXpert MRSA Assay (FDA approved for NASAL specimens only), is one component of a comprehensive MRSA colonization surveillance program. It is not intended to diagnose MRSA infection nor to guide or monitor treatment for MRSA infections.      Labs: BNP (last 3 results) No results for input(s): BNP in the last 8760 hours. Basic Metabolic Panel: Recent Labs  Lab 08/08/17 2322 08/09/17 0733 08/11/17 0551  NA 143 141 138   K 3.2* 3.3* 4.1  CL 106 105 104  CO2 27 25 23   GLUCOSE 126* 109* 98  BUN 20 16 25*  CREATININE 1.19* 1.03* 1.22*  CALCIUM 9.3 8.9 9.1  MG  --  2.5*  --    Liver Function Tests: Recent Labs  Lab 08/08/17 2322  AST 27  ALT 23  ALKPHOS 73  BILITOT 0.7  PROT 6.7  ALBUMIN 3.5   No results for input(s): LIPASE, AMYLASE in the last 168 hours. Recent Labs  Lab 08/09/17 0733  AMMONIA 14   CBC: Recent Labs  Lab 08/08/17 2322 08/09/17 0733 08/11/17 0551  WBC 8.7 8.4 8.7  NEUTROABS 5.8 5.6  --   HGB 10.8* 10.7* 11.2*  HCT 34.5* 34.5*  33.7* 36.7  MCV 82.1 82.7 81.9  PLT 225 221 202   Cardiac Enzymes: No results for input(s): CKTOTAL, CKMB, CKMBINDEX, TROPONINI in the last 168 hours. BNP: Invalid input(s): POCBNP CBG: Recent Labs  Lab 08/10/17 0732 08/11/17 0725 08/12/17 0753 08/13/17 0738 08/14/17 0813  GLUCAP 91 132* 82 85 96   D-Dimer No results for input(s): DDIMER in the last 72 hours. Hgb A1c No results for input(s): HGBA1C in the last 72 hours. Lipid Profile No results for input(s): CHOL, HDL, LDLCALC, TRIG, CHOLHDL, LDLDIRECT in the last 72 hours. Thyroid function studies No results for input(s): TSH, T4TOTAL, T3FREE, THYROIDAB in the last 72 hours.  Invalid input(s): FREET3 Anemia work up No results for input(s): VITAMINB12, FOLATE, FERRITIN, TIBC, IRON, RETICCTPCT in the last 72 hours. Urinalysis    Component Value Date/Time   COLORURINE YELLOW 08/08/2017 2350   APPEARANCEUR CLOUDY (A) 08/08/2017 2350   LABSPEC 1.019 08/08/2017 2350   PHURINE 7.0 08/08/2017 2350   GLUCOSEU NEGATIVE 08/08/2017 2350   HGBUR NEGATIVE 08/08/2017 2350   BILIRUBINUR NEGATIVE 08/08/2017 2350   KETONESUR NEGATIVE 08/08/2017 2350   PROTEINUR 100 (A) 08/08/2017 2350   UROBILINOGEN 1.0 12/30/2014  1930   NITRITE NEGATIVE 08/08/2017 2350   LEUKOCYTESUR LARGE (A) 08/08/2017 2350   Sepsis Labs Invalid input(s): PROCALCITONIN,  WBC,  LACTICIDVEN Microbiology Recent  Results (from the past 240 hour(s))  Urine Culture     Status: Abnormal   Collection Time: 08/08/17 11:50 PM  Result Value Ref Range Status   Specimen Description URINE, CLEAN CATCH  Final   Special Requests NONE  Final   Culture 70,000 COLONIES/mL PROTEUS MIRABILIS (A)  Final   Report Status 08/11/2017 FINAL  Final   Organism ID, Bacteria PROTEUS MIRABILIS (A)  Final      Susceptibility   Proteus mirabilis - MIC*    AMPICILLIN <=2 SENSITIVE Sensitive     CEFAZOLIN <=4 SENSITIVE Sensitive     CEFTRIAXONE <=1 SENSITIVE Sensitive     CIPROFLOXACIN >=4 RESISTANT Resistant     GENTAMICIN 8 INTERMEDIATE Intermediate     IMIPENEM 0.5 SENSITIVE Sensitive     NITROFURANTOIN >=512 RESISTANT Resistant     TRIMETH/SULFA >=320 RESISTANT Resistant     AMPICILLIN/SULBACTAM <=2 SENSITIVE Sensitive     PIP/TAZO <=4 SENSITIVE Sensitive     * 70,000 COLONIES/mL PROTEUS MIRABILIS  Culture, blood (Routine X 2) w Reflex to ID Panel     Status: None   Collection Time: 08/09/17  1:06 AM  Result Value Ref Range Status   Specimen Description BLOOD LEFT ARM  Final   Special Requests   Final    BOTTLES DRAWN AEROBIC ONLY Blood Culture adequate volume   Culture NO GROWTH 5 DAYS  Final   Report Status 08/14/2017 FINAL  Final  Culture, blood (Routine X 2) w Reflex to ID Panel     Status: None   Collection Time: 08/09/17  1:14 AM  Result Value Ref Range Status   Specimen Description RIGHT ANTECUBITAL  Final   Special Requests   Final    BOTTLES DRAWN AEROBIC ONLY Blood Culture adequate volume   Culture NO GROWTH 5 DAYS  Final   Report Status 08/14/2017 FINAL  Final  MRSA PCR Screening     Status: None   Collection Time: 08/09/17  4:20 AM  Result Value Ref Range Status   MRSA by PCR NEGATIVE NEGATIVE Final    Comment:        The GeneXpert MRSA Assay (FDA approved for NASAL specimens only), is one component of a comprehensive MRSA colonization surveillance program. It is not intended to diagnose  MRSA infection nor to guide or monitor treatment for MRSA infections.      Time coordinating discharge: Over 30 minutes  SIGNED:   Eddie North, MD  [facility] 08/14/2017, 11:33 AM Pager   If 7PM-7AM, please contact night-coverage [email or web address] Password [email or web address]

## 2017-08-14 NOTE — Progress Notes (Signed)
Pt refused am vitals. She has been very combative throughout the night.

## 2017-08-14 NOTE — Clinical Social Work Note (Signed)
LCSW met with Fabio Pierce, RN, Administrator at [facility]. She assessed patient and advised that patient can return to the facility at discharge.   LCSW notified attending via [facility] page.     [name], [name], LCSW

## 2017-08-14 NOTE — NC FL2 (Signed)
[place] MEDICAID FL2 LEVEL OF CARE SCREENING TOOL     IDENTIFICATION  Patient Name: [name] Birthdate: 20-Apr-1931 Sex: female Admission Date (Current Location): 08/08/2017  [facility] [place]   Facility and Address:  [facility],  [address], [place] [id number]      Provider Number: [id number]  Attending Physician Name and Address:  Eddie Northhungel, Nishant, MD  Relative Name and Phone Number:       Current Level of Care: Hospital [facility] Prior Approval Number:    Date Approved/Denied:   PASRR Number: [id number]  Discharge Plan: Other (Comment)([facility])    Current Diagnoses: Patient Active Problem List   Diagnosis Date Noted  . Proteus mirabilis infection/UTI 08/10/2017  . AF (paroxysmal atrial fibrillation) (HCC) 08/09/2017  . Hypokalemia 08/09/2017  . Toxic Acute encephalopathy due to UTI 08/09/2017  . CKD (chronic kidney disease), stage III (HCC) 08/09/2017  . CAD in native artery 03/15/2015  . Cerebrovascular accident, old 03/15/2015  . Hypothyroidism due to Hashimoto's thyroiditis 03/15/2015  . Neuropathy 03/15/2015  . Avitaminosis D 03/15/2015  . Hypercholesterolemia without hypertriglyceridemia 03/15/2015  . Pain in joint, shoulder region 02/13/2015  . Cellulitis 01/28/2015  . Renal insufficiency 01/06/2015  . Hypothyroidism 12/28/2014  . Expressive aphasia 12/28/2014  . Acute ischemic left MCA stroke (HCC) 12/26/2014  . Exertional dyspnea 05/23/2014  . Spider veins of both lower extremities 05/10/2014  . Varicose veins of leg with complications 05/10/2014  . ICD (implantable cardioverter-defibrillator) in place 04/22/2014  . VF (ventricular fibrillation) (HCC) 03/29/2014  . Mixed hyperlipidemia 04/21/2013  . Chronic systolic heart failure (HCC) 03/26/2013  . Ischemic cardiomyopathy   . Coronary atherosclerosis of native coronary artery   . COPD 02/27/2009  . SPINAL  STENOSIS 02/27/2009  . Essential hypertension, benign 02/01/2009  . Automatic implantable cardioverter-defibrillator in situ 02/01/2009    Orientation RESPIRATION BLADDER Height & Weight     Self  Normal Incontinent Weight: 133 lb 6.1 oz (60.5 kg) Height:  5' (152.4 cm)  BEHAVIORAL SYMPTOMS/MOOD NEUROLOGICAL BOWEL NUTRITION STATUS      Incontinent (Heart Healthy)  AMBULATORY STATUS COMMUNICATION OF NEEDS Skin   Limited Assist Verbally Normal                       Personal Care Assistance Level of Assistance  Bathing, Feeding, Dressing Bathing Assistance: Limited assistance Feeding assistance: Independent Dressing Assistance: Limited assistance     Functional Limitations Info             SPECIAL CARE FACTORS FREQUENCY  PT (By licensed PT)     PT Frequency: 5x/week              Contractures Contractures Info: Not present    Additional Factors Info  Code Status, Allergies, Psychotropic Code Status Info: Full code Allergies Info: Nsaids, Oxycodone Hcl, Povidone-iodine, Prednisone, Amoxicillian-potClavulante, Betadine, Doxazosin Mesylate, Cephalexin, Cymbalta, Gabapentin, Neomycin, Tape Psychotropic Info: Risperdal, Zoloft         Current Medications (08/14/2017):  This is the current hospital active medication list Current Facility-Administered Medications  Medication Dose Route Frequency Provider Last Rate Last Dose  . acetaminophen (TYLENOL) tablet 650 mg  650 mg Oral Q6H PRN Opyd, Lavone Neriimothy S, MD   650 mg at 08/12/17 1253   Or  . acetaminophen (TYLENOL) suppository 650 mg  650 mg Rectal Q6H PRN Opyd, Lavone Neriimothy S, MD      . aspirin chewable tablet 81  mg  81 mg Oral Daily Opyd, Lavone Neri, MD   81 mg at 08/14/17 1122  . atorvastatin (LIPITOR) tablet 40 mg  40 mg Oral q1800 Opyd, Lavone Neri, MD   40 mg at 08/13/17 1600  . bisacodyl (DULCOLAX) EC tablet 5 mg  5 mg Oral Daily PRN Opyd, Lavone Neri, MD      . cefUROXime (CEFTIN) tablet 250 mg  250 mg Oral BID WC  Dhungel, Nishant, MD   250 mg at 08/14/17 0747  . cholecalciferol (VITAMIN D) tablet 2,000 Units  2,000 Units Oral Daily Opyd, Lavone Neri, MD   2,000 Units at 08/14/17 1121  . clopidogrel (PLAVIX) tablet 75 mg  75 mg Oral QHS Briscoe Deutscher, MD   75 mg at 08/11/17 2124  . docusate sodium (COLACE) capsule 100 mg  100 mg Oral Daily Opyd, Lavone Neri, MD   100 mg at 08/14/17 1121  . enoxaparin (LOVENOX) injection 30 mg  30 mg Subcutaneous Q24H Dhungel, Nishant, MD   30 mg at 08/14/17 1122  . haloperidol lactate (HALDOL) injection 5 mg  5 mg Intramuscular Q6H PRN Emokpae, Courage, MD   5 mg at 08/14/17 0200  . hydrALAZINE (APRESOLINE) injection 10 mg  10 mg Intravenous Q4H PRN Opyd, Lavone Neri, MD   10 mg at 08/10/17 0223  . levothyroxine (SYNTHROID, LEVOTHROID) tablet 100 mcg  100 mcg Oral QAC breakfast Opyd, Lavone Neri, MD   100 mcg at 08/14/17 0747  . metoprolol tartrate (LOPRESSOR) tablet 25 mg  25 mg Oral BID Mariea Clonts, Courage, MD   25 mg at 08/14/17 1121  . misoprostol (CYTOTEC) tablet 100 mcg  100 mcg Oral BID Briscoe Deutscher, MD   100 mcg at 08/14/17 1121  . ondansetron (ZOFRAN) tablet 4 mg  4 mg Oral Q6H PRN Opyd, Lavone Neri, MD       Or  . ondansetron (ZOFRAN) injection 4 mg  4 mg Intravenous Q6H PRN Opyd, Lavone Neri, MD      . polyethylene glycol (MIRALAX / GLYCOLAX) packet 17 g  17 g Oral Daily Dhungel, Nishant, MD   17 g at 08/14/17 1123  . potassium chloride SA (K-DUR,KLOR-CON) CR tablet 20 mEq  20 mEq Oral Daily Opyd, Lavone Neri, MD   20 mEq at 08/14/17 1121  . QUEtiapine (SEROQUEL) tablet 25 mg  25 mg Oral BID Dhungel, Nishant, MD   25 mg at 08/14/17 1122  . senna-docusate (Senokot-S) tablet 1 tablet  1 tablet Oral QHS PRN Opyd, Lavone Neri, MD      . vitamin B-12 (CYANOCOBALAMIN) tablet 1,000 mcg  1,000 mcg Oral Daily Opyd, Lavone Neri, MD   1,000 mcg at 08/14/17 1122     Discharge Medications: STOP taking these medications   ciprofloxacin 250 MG tablet Commonly known as:  CIPRO    sertraline 50 MG tablet Commonly known as:  ZOLOFT     TAKE these medications   acetaminophen 650 MG CR tablet Commonly known as:  TYLENOL Take 650 mg by mouth every 8 (eight) hours as needed for pain.   aspirin 81 MG chewable tablet Chew 81 mg by mouth daily.   atorvastatin 40 MG tablet Commonly known as:  LIPITOR Take 40 mg by mouth at bedtime.   cefUROXime 250 MG tablet Commonly known as:  CEFTIN Take 1 tablet (250 mg total) by mouth 2 (two) times daily with a meal for 2 days.   clopidogrel 75 MG tablet Commonly known as:  PLAVIX Take 75 mg  by mouth at bedtime.   docusate sodium 100 MG capsule Commonly known as:  COLACE Take 100 mg by mouth daily.   Melatonin 10 MG Tabs Take 10 mg by mouth every evening.   metoprolol tartrate 25 MG tablet Commonly known as:  LOPRESSOR Take 25 mg by mouth 2 (two) times daily.   misoprostol 100 MCG tablet Commonly known as:  CYTOTEC Take 100 mcg by mouth 2 (two) times daily.   polyethylene glycol packet Commonly known as:  MIRALAX / GLYCOLAX Take 17 g by mouth daily.   potassium chloride 10 MEQ tablet Commonly known as:  K-DUR,KLOR-CON Take 10 mEq by mouth daily.   QUEtiapine 25 MG tablet Commonly known as:  SEROQUEL Take 1 tablet (25 mg total) by mouth 2 (two) times daily.   SYNTHROID 100 MCG tablet Generic drug:  levothyroxine Take 100 mcg by mouth daily before breakfast.   vitamin B-12 1000 MCG tablet Commonly known as:  CYANOCOBALAMIN Take 1,000 mcg by mouth daily.   Vitamin D 2000 units Caps Take 2,000 Units by mouth daily.      Relevant Imaging Results:  Relevant Lab Results:   Additional Information SSN [id number] Juleen China, LCSW

## 2017-08-14 NOTE — Progress Notes (Signed)
[facility] Facility ready and waiting for patient. Family at bedside. Patient transported back to facility by family. Vital signs stable.

## 2017-08-21 NOTE — Progress Notes (Signed)
Cardiology Office Note  Date: 08/22/2017   ID: JAMAIA BRUM, DOB 03/29/1931, MRN [id number]  PCP: Benita Stabile, MD  Primary Cardiologist: Nona Dell, MD   Chief Complaint  Patient presents with  . Coronary Artery Disease    History of Present Illness: [name] is an 84 y.o. female not seen in the office since November 2016.  She has been following in the interim with [facility] in [place], saw Dr. Dawayne Patricia, last visit in August 2018.  She is referred to the office by Dr. Margo Aye having recently established with him as PCP.  She has moved into [facility] now.  Her daughter present today indicates that patient had a stroke back in November 2018.  Still has an expressive aphasia.  Reviewed additional records.  She was recently hospitalized with encephalopathy in the setting of UTI, also noted to have paroxysmal atrial fibrillation during that hospital stay. CHADSVASC score is 8.  Anticoagulation was not initiated.  In talking with the patient's daughter today, she apparently had evidence of atrial fibrillation documented in the setting of her most recent stroke in November 2018, although was not placed on anticoagulation than either.  There were some concerns about fall risk.  She had a more remote history of GI bleed but nothing recently on aspirin and Plavix.  We discussed this issue in detail today.  She has had her Medtronic ICD interrogated through [facility] in [place].  Device check in December 2018 reported normal function, no VT/VF episodes.  She is establishing with Dr. Ladona Ridgel in our device clinic.  Follow-up cardiac testing from 2017 and 2018 is outlined below.  I reviewed these results.  Current cardiac regimen includes aspirin, Plavix, Lipitor, and Lopressor.  She has had no obvious progressive angina symptoms.  Past Medical History:  Diagnosis Date  . Automatic implantable cardiac defibrillator in situ    Medtronic  . B12 deficiency   . Coronary  atherosclerosis of native coronary artery    Stent to OM3, DES D1 and RCA 3/12  . Depression   . Essential hypertension, benign   . Hyperlipidemia   . Hypothyroidism   . Ischemic cardiomyopathy    LVEF 35-40%  . Myocardial infarction [facility])    May 2006, March 2012  . Osteopenia   . Spinal stenosis    Chronic back pain    Past Surgical History:  Procedure Laterality Date  . ABDOMINAL HYSTERECTOMY  1971  . APPENDECTOMY  1956  . CARDIAC DEFIBRILLATOR PLACEMENT     Medtronic  . CATARACT EXTRACTION W/PHACO Left 10/04/2013   Procedure: CATARACT EXTRACTION PHACO AND INTRAOCULAR LENS PLACEMENT (IOC);  Surgeon: Gemma Payor, MD;  Location: [facility];  Service: Ophthalmology;  Laterality: Left;  CDE 9.37  . CATARACT EXTRACTION W/PHACO Right 10/25/2013   Procedure: CATARACT EXTRACTION PHACO AND INTRAOCULAR LENS PLACEMENT (IOC);  Surgeon: Gemma Payor, MD;  Location: [facility];  Service: Ophthalmology;  Laterality: Right;  CDE 8.57  . IMPLANTABLE CARDIOVERTER DEFIBRILLATOR (ICD) GENERATOR CHANGE N/A 04/22/2014   Procedure: ICD GENERATOR CHANGE;  Surgeon: Marinus Maw, MD;  Location: [facility] CATH LAB;  Service: Cardiovascular;  Laterality: N/A;  . JOINT REPLACEMENT    . REPLACEMENT TOTAL KNEE Bilateral    Right-April 2006, Left-April 2011  . TONSILLECTOMY  1952    Current Outpatient Medications  Medication Sig Dispense Refill  . acetaminophen (TYLENOL) 650 MG CR tablet Take 650 mg by mouth every 8 (eight) hours as needed for pain.     Marland Kitchen  aspirin 81 MG chewable tablet Chew 81 mg by mouth daily.    Marland Kitchen. atorvastatin (LIPITOR) 40 MG tablet Take 40 mg by mouth at bedtime.     . Cholecalciferol (VITAMIN D) 2000 UNITS CAPS Take 2,000 Units by mouth daily.    . clopidogrel (PLAVIX) 75 MG tablet Take 75 mg by mouth at bedtime.     . docusate sodium (COLACE) 100 MG capsule Take 100 mg by mouth daily.    Marland Kitchen. levothyroxine (SYNTHROID) 100 MCG tablet Take 100 mcg by mouth daily before breakfast.     . Melatonin 10 MG TABS  Take 10 mg by mouth every evening.    . metoprolol tartrate (LOPRESSOR) 25 MG tablet Take 25 mg by mouth 2 (two) times daily.    . misoprostol (CYTOTEC) 100 MCG tablet Take 100 mcg by mouth 2 (two) times daily.    . polyethylene glycol (MIRALAX / GLYCOLAX) packet Take 17 g by mouth daily. 14 each 0  . potassium chloride (K-DUR,KLOR-CON) 10 MEQ tablet Take 10 mEq by mouth daily.     . QUEtiapine (SEROQUEL) 25 MG tablet Take 1 tablet (25 mg total) by mouth 2 (two) times daily. 30 tablet 0  . vitamin B-12 (CYANOCOBALAMIN) 1000 MCG tablet Take 1,000 mcg by mouth daily.      No current facility-administered medications for this visit.    Allergies:  Nsaids; Oxycodone hcl; Povidone-iodine; Prednisone; Amoxicillin-pot clavulanate; Betadine [povidone iodine]; Doxazosin mesylate; Cephalexin; Cymbalta [duloxetine hcl]; Gabapentin; Neomycin; and Tape   Social History: The patient  reports that she quit smoking about 6 years ago. Her smoking use included cigarettes. She has a 12.50 pack-year smoking history. she has never used smokeless tobacco. She reports that she does not drink alcohol or use drugs.   Family History: The patient's family history includes Heart attack in her brother and sister; Stroke in her father, mother, and sister.   ROS:  Please see the history of present illness. Otherwise, complete review of systems is positive for expressive aphasia.  All other systems are reviewed and negative.   Physical Exam: VS:  BP 134/84 (BP Location: Right Arm)   Pulse (!) 57   Ht 5' (1.524 m)   Wt 136 lb 9.6 oz (62 kg)   SpO2 97%   BMI 26.68 kg/m , BMI Body mass index is 26.68 kg/m.  Wt Readings from Last 3 Encounters:  08/22/17 136 lb 9.6 oz (62 kg)  08/13/17 133 lb 6.1 oz (60.5 kg)  06/01/15 138 lb 9.6 oz (62.9 kg)    General: Elderly woman, no distress. HEENT: Conjunctiva and lids normal, oropharynx clear. Neck: Supple, no elevated JVP or carotid bruits, no thyromegaly. Lungs: Clear to  auscultation, nonlabored breathing at rest. Cardiac: Regular rate and rhythm, no S3, soft systolic murmur. Abdomen: Soft, nontender, bowel sounds present. Extremities: Trace left ankle edema, distal pulses 2+. Skin: Warm and dry. Musculoskeletal: No kyphosis. Neuropsychiatric: Alert and oriented x3, affect grossly appropriate.  Expressive aphasia.  ECG: I personally reviewed the tracing from 08/08/2017 which showed atrial fibrillation with increased voltage, possible old inferior infarct pattern, nonspecific ST changes.  Recent Labwork: 08/08/2017: ALT 23; AST 27 08/09/2017: Magnesium 2.5; TSH 3.826 08/11/2017: BUN 25; Creatinine, Ser 1.22; Hemoglobin 11.2; Platelets 202; Potassium 4.1; Sodium 138     Component Value Date/Time   CHOL 207 (H) 12/28/2014 0454   TRIG 225 (H) 12/28/2014 0454   HDL 41 12/28/2014 0454   CHOLHDL 5.0 12/28/2014 0454   VLDL 45 (H) 12/28/2014  0454   LDLCALC 121 (H) 12/28/2014 0454    Other Studies Reviewed Today:  Echocardiogram 06/11/2017 ([facility]): Interpretation Summary 1. Normal LV size with mildly depressed LVEF with regional WMA c/w prior inferior MI 2. Mild to moderate MR 3. Mild TR with moderately elevated RVSP  Left Ventricle The left ventricle is normal in size. There is no thrombus. Proximal septal thickening is noted. Ejection Fraction = 40-45%. Indeterminate diastolic function. Akinesis of the mid and distal inferior wall extending into the inferior septal wall and into the apex.  Right Ventricle There is a pacer lead in the right heart. The right ventricle is normal in structure and function. Atria LA Volume is 40 ml/m2. LA Volume is c/w mildly dilated (35 ml/m2- 41 ml/m2). Right atrial size is normal. Mitral Valve There is mild mitral annular calcification. The mitral valve leaflets appear thickened, but open well. Mild to moderate MR. Tricuspid Valve There is mild tricuspid regurgitation (1+). Right Ventricular Systolic Pressure is  46 mmHg (consistent with Moderate Pulmonary Hypertension). Aortic Valve There is thickening of the non cusp. Moderate aortic sclerosis is present with good valvular opening. Trace aortic regurgitation. Pulmonic Valve The pulmonic valve is not well visualized. There is no pulmonic valvular stenosis. There is no pulmonic valvular regurgitation. Great Vessels The aortic root is not well visualized. Pericardium/Pleural There is no pericardial effusion.  Lexiscan Myoview 01/24/2016: IMPRESSION: 1) Electrocardiographically nondiagnostic due to pharmacologic infusion. 2) Abnormal SPECT with evidence of ischemia in the RCA territory. There is perfusion abnormality noted during rest as well. There is apical infarction without evidence of ischemia.  3) Mild to moderately reduced LV systolic function with RCA territory WMA and calculated ejection fraction of 39%.  4) No comparison study available.   CLINICAL HISTORY: Jamica Woodyard is a 84 y.o. with history of CAD who is referred to the nuclear stress lab for evaluation of coronary insufficiency, wall motion analysis and left ventricular systolic function. Patient's height is 60 inches and weight is 135 lbs  PROCEDURE: Images acquired and processed on a separate SPECT camera system.  After informed consent was obtained and patient pregnancy ( females only) ruled out, an intravenous line was established in the forearm. The patient underwent myocardial perfusion imaging, using pharmalogic stress with intravenous Lexiscan protocol. Lexiscan was administered as a single 0.4 mg intravenously over 10-15 seconds. followed immediately by a 5 ml saline solution flush. The patient was injected with 26.4 mCi Sestamibi intravenously 10-20 seconds after injection of Lexiscan, and with 8.0 mCi Sestamibi intravenously at rest followed immediately by a 5 ml saline solution flush. Tomographic imaging was performed approximately one hour after each  injection. Image data was reconstructed into transverse sections, which were subsequently reformatted to provide horizontal axis, vertical long axis and short axis sections. These sections were reviewed visually and compared to a computer database.  [facility] received a Lexiscan injection using the standard protocol, resting BP is 132/60 mmHg.and HR is 57.Post Lexiscan infusion BP 124/60 and HR73.  Patient experienced dyspnea, lightheadedness during the infusion.   ECG The resting ECG demonstrates NSR, LVH with strain, anteroseptal MI pattern. Stress ECGs were non diagnostic for ischemia.   PERFUSION The stress and rest SPECT images demonstrate moderate intensity, large size, non-transmural perfusion abnormality in the inferior and inferior septum that is worse with stress. There is also an apical transmural infarction without evidence of significant ischemia.   WALL MOTION AND EJECTION FRACTION Gated SPECT analysis revealed normal size left ventricle  with RCA territory WMA with an ejection fraction of 39%.  STUDY QUALITY AND RISK Fair quality and overall intermediate risk study. No prior studies for comparison.  Assessment and Plan:  1.  Symptomatically stable CAD with history of stent interventions to the OM 3, first diagonal, and RCA, most recently DES interventions in 2012.  No definite angina at this time on medical therapy.  Cardiolite from 2017 done through her previous practice showed evidence of inferior scar with peri-infarct ischemia that is been managed medically.  She remains on aspirin and Plavix.  2.  Ischemic cardiomyopathy with LVEF 40-45% based on recent echocardiogram.  She is on beta-blocker, no ARB or ACE inhibitor.  We will revisit this.  3.  Medtronic ICD in place, to establish with Dr. Ladona Ridgel in our device clinic.  Remote interrogation from December 2018 through her previous practice was unremarkable.  No device shocks.  4.  Paroxysmal atrial  fibrillation as discussed above.  CHADSVASC score is 8.  I had a discussion with the patient and her daughter today.  She has not had recent falls or GI bleeding on dual antiplatelet therapy.  Recommend considering a switch to Eliquis.  They will discuss the matter with family and make a decision.  5.  Hyperlipidemia, on Lipitor.  Current medicines were reviewed with the patient today.  Disposition: Patient's daughter will call back regarding decision on anticoagulation.  Otherwise follow-up in 6 months.  Interval follow-up in device clinic scheduled.  Signed, Jonelle Sidle, MD, [facility] 08/22/2017 1:41 PM    [facility] at [facility] [address] [place], [place] [address] Phone: [phone]; Fax: [phone]

## 2017-08-22 ENCOUNTER — Encounter: Payer: Self-pay | Admitting: Cardiology

## 2017-08-22 ENCOUNTER — Ambulatory Visit (INDEPENDENT_AMBULATORY_CARE_PROVIDER_SITE_OTHER): Payer: Medicare Other | Admitting: Cardiology

## 2017-08-22 VITALS — BP 134/84 | HR 57 | Ht 60.0 in | Wt 136.6 lb

## 2017-08-22 DIAGNOSIS — Z95810 Presence of automatic (implantable) cardiac defibrillator: Secondary | ICD-10-CM | POA: Diagnosis not present

## 2017-08-22 DIAGNOSIS — I25119 Atherosclerotic heart disease of native coronary artery with unspecified angina pectoris: Secondary | ICD-10-CM

## 2017-08-22 DIAGNOSIS — E782 Mixed hyperlipidemia: Secondary | ICD-10-CM | POA: Diagnosis not present

## 2017-08-22 DIAGNOSIS — I255 Ischemic cardiomyopathy: Secondary | ICD-10-CM

## 2017-08-22 DIAGNOSIS — I209 Angina pectoris, unspecified: Secondary | ICD-10-CM

## 2017-08-22 DIAGNOSIS — I480 Paroxysmal atrial fibrillation: Secondary | ICD-10-CM | POA: Diagnosis not present

## 2017-08-22 NOTE — Patient Instructions (Signed)
Your physician wants you to follow-up in:  6 months with Dr McDowell You will receive a reminder letter in the mail two months in advance. If you don't receive a letter, please call our office to schedule the follow-up appointment.    Your physician recommends that you continue on your current medications as directed. Please refer to the Current Medication list given to you today.     If you need a refill on your cardiac medications before your next appointment, please call your pharmacy.     No lab work or testing ordered today.      Thank you for choosing [facility] !        

## 2017-09-03 ENCOUNTER — Encounter (HOSPITAL_COMMUNITY): Payer: Self-pay | Admitting: Emergency Medicine

## 2017-09-03 ENCOUNTER — Other Ambulatory Visit: Payer: Self-pay

## 2017-09-03 ENCOUNTER — Emergency Department (HOSPITAL_COMMUNITY)
Admission: EM | Admit: 2017-09-03 | Discharge: 2017-09-04 | Disposition: A | Discharge status: Home / Self Care | Payer: Medicare Other | Attending: Emergency Medicine | Admitting: Emergency Medicine

## 2017-09-03 ENCOUNTER — Emergency Department (HOSPITAL_COMMUNITY): Payer: Medicare Other

## 2017-09-03 DIAGNOSIS — Z87891 Personal history of nicotine dependence: Secondary | ICD-10-CM | POA: Insufficient documentation

## 2017-09-03 DIAGNOSIS — Z96653 Presence of artificial knee joint, bilateral: Secondary | ICD-10-CM | POA: Diagnosis not present

## 2017-09-03 DIAGNOSIS — Z7982 Long term (current) use of aspirin: Secondary | ICD-10-CM | POA: Insufficient documentation

## 2017-09-03 DIAGNOSIS — E039 Hypothyroidism, unspecified: Secondary | ICD-10-CM | POA: Insufficient documentation

## 2017-09-03 DIAGNOSIS — I5022 Chronic systolic (congestive) heart failure: Secondary | ICD-10-CM | POA: Diagnosis not present

## 2017-09-03 DIAGNOSIS — Z79899 Other long term (current) drug therapy: Secondary | ICD-10-CM | POA: Insufficient documentation

## 2017-09-03 DIAGNOSIS — Z7902 Long term (current) use of antithrombotics/antiplatelets: Secondary | ICD-10-CM | POA: Diagnosis not present

## 2017-09-03 DIAGNOSIS — J449 Chronic obstructive pulmonary disease, unspecified: Secondary | ICD-10-CM | POA: Insufficient documentation

## 2017-09-03 DIAGNOSIS — R4182 Altered mental status, unspecified: Secondary | ICD-10-CM | POA: Insufficient documentation

## 2017-09-03 DIAGNOSIS — I252 Old myocardial infarction: Secondary | ICD-10-CM | POA: Insufficient documentation

## 2017-09-03 DIAGNOSIS — R1013 Epigastric pain: Secondary | ICD-10-CM | POA: Diagnosis present

## 2017-09-03 DIAGNOSIS — N183 Chronic kidney disease, stage 3 (moderate): Secondary | ICD-10-CM | POA: Insufficient documentation

## 2017-09-03 DIAGNOSIS — I130 Hypertensive heart and chronic kidney disease with heart failure and stage 1 through stage 4 chronic kidney disease, or unspecified chronic kidney disease: Secondary | ICD-10-CM | POA: Diagnosis not present

## 2017-09-03 DIAGNOSIS — I2510 Atherosclerotic heart disease of native coronary artery without angina pectoris: Secondary | ICD-10-CM | POA: Insufficient documentation

## 2017-09-03 DIAGNOSIS — F329 Major depressive disorder, single episode, unspecified: Secondary | ICD-10-CM | POA: Diagnosis not present

## 2017-09-03 DIAGNOSIS — Z95810 Presence of automatic (implantable) cardiac defibrillator: Secondary | ICD-10-CM | POA: Diagnosis not present

## 2017-09-03 LAB — CBC WITH DIFFERENTIAL/PLATELET
BASOS PCT: 0 %
Basophils Absolute: 0 10*3/uL (ref 0.0–0.1)
EOS ABS: 0.3 10*3/uL (ref 0.0–0.7)
EOS PCT: 3 %
HCT: 35.4 % — ABNORMAL LOW (ref 36.0–46.0)
Hemoglobin: 10.8 g/dL — ABNORMAL LOW (ref 12.0–15.0)
LYMPHS ABS: 2.7 10*3/uL (ref 0.7–4.0)
Lymphocytes Relative: 27 %
MCH: 25.1 pg — AB (ref 26.0–34.0)
MCHC: 30.5 g/dL (ref 30.0–36.0)
MCV: 82.3 fL (ref 78.0–100.0)
MONO ABS: 1 10*3/uL (ref 0.1–1.0)
MONOS PCT: 10 %
Neutro Abs: 5.9 10*3/uL (ref 1.7–7.7)
Neutrophils Relative %: 60 %
Platelets: 228 10*3/uL (ref 150–400)
RBC: 4.3 MIL/uL (ref 3.87–5.11)
RDW: 16.4 % — AB (ref 11.5–15.5)
WBC: 10 10*3/uL (ref 4.0–10.5)

## 2017-09-03 LAB — LIPASE, BLOOD: Lipase: 28 U/L (ref 11–51)

## 2017-09-03 LAB — COMPREHENSIVE METABOLIC PANEL
ALBUMIN: 3.7 g/dL (ref 3.5–5.0)
ALK PHOS: 79 U/L (ref 38–126)
ALT: 14 U/L (ref 14–54)
ANION GAP: 12 (ref 5–15)
AST: 22 U/L (ref 15–41)
BUN: 13 mg/dL (ref 6–20)
CALCIUM: 9.6 mg/dL (ref 8.9–10.3)
CO2: 25 mmol/L (ref 22–32)
Chloride: 102 mmol/L (ref 101–111)
Creatinine, Ser: 0.92 mg/dL (ref 0.44–1.00)
GFR calc non Af Amer: 56 mL/min — ABNORMAL LOW (ref >60)
Glucose, Bld: 115 mg/dL — ABNORMAL HIGH (ref 65–99)
POTASSIUM: 3.6 mmol/L (ref 3.5–5.1)
SODIUM: 139 mmol/L (ref 135–145)
TOTAL PROTEIN: 7.1 g/dL (ref 6.5–8.1)
Total Bilirubin: 0.2 mg/dL — ABNORMAL LOW (ref 0.3–1.2)

## 2017-09-03 LAB — TROPONIN I

## 2017-09-03 MED ORDER — GI COCKTAIL ~~LOC~~
30.0000 mL | Freq: Once | ORAL | Status: AC
  Administered 2017-09-03: 30 mL via ORAL
  Filled 2017-09-03: qty 30

## 2017-09-03 MED ORDER — ACETAMINOPHEN 325 MG PO TABS
650.0000 mg | ORAL_TABLET | Freq: Once | ORAL | Status: AC
  Administered 2017-09-03: 650 mg via ORAL
  Filled 2017-09-03: qty 2

## 2017-09-03 NOTE — ED Triage Notes (Signed)
Per EMS[facility] Pt c/o upper abdominal pain that began today without n/v/d.

## 2017-09-03 NOTE — ED Provider Notes (Addendum)
[facility] EMERGENCY DEPARTMENT Provider Note   CSN: [id number] Arrival date & time: 09/03/17  2130     History   Chief Complaint Chief Complaint  Patient presents with  . Abdominal Pain    HPI [name] is a 84 y.o. female.   Abdominal Pain   This is a new problem. The current episode started 6 to 12 hours ago. The problem occurs constantly. The problem has not changed since onset.The pain is located in the epigastric region and chest. The quality of the pain is sharp. The pain is moderate. Pertinent negatives include fever. Nothing aggravates the symptoms. Nothing relieves the symptoms.    Past Medical History:  Diagnosis Date  . Automatic implantable cardiac defibrillator in situ    Medtronic  . B12 deficiency   . Coronary atherosclerosis of native coronary artery    Stent to OM3, DES D1 and RCA 3/12  . Depression   . Essential hypertension, benign   . Hyperlipidemia   . Hypothyroidism   . Ischemic cardiomyopathy    LVEF 35-40%  . Myocardial infarction [facility])    May 2006, March 2012  . Osteopenia   . Spinal stenosis    Chronic back pain    Patient Active Problem List   Diagnosis Date Noted  . Proteus mirabilis infection/UTI 08/10/2017  . AF (paroxysmal atrial fibrillation) (HCC) 08/09/2017  . Hypokalemia 08/09/2017  . Toxic Acute encephalopathy due to UTI 08/09/2017  . CKD (chronic kidney disease), stage III (HCC) 08/09/2017  . CAD in native artery 03/15/2015  . Cerebrovascular accident, old 03/15/2015  . Hypothyroidism due to Hashimoto's thyroiditis 03/15/2015  . Neuropathy 03/15/2015  . Avitaminosis D 03/15/2015  . Hypercholesterolemia without hypertriglyceridemia 03/15/2015  . Pain in joint, shoulder region 02/13/2015  . Cellulitis 01/28/2015  . Renal insufficiency 01/06/2015  . Hypothyroidism 12/28/2014  . Expressive aphasia 12/28/2014  . Acute ischemic left MCA stroke (HCC) 12/26/2014  . Exertional dyspnea 05/23/2014  . Spider veins of  both lower extremities 05/10/2014  . Varicose veins of leg with complications 05/10/2014  . ICD (implantable cardioverter-defibrillator) in place 04/22/2014  . VF (ventricular fibrillation) (HCC) 03/29/2014  . Mixed hyperlipidemia 04/21/2013  . Chronic systolic heart failure (HCC) 03/26/2013  . Ischemic cardiomyopathy   . Coronary atherosclerosis of native coronary artery   . COPD 02/27/2009  . SPINAL STENOSIS 02/27/2009  . Essential hypertension, benign 02/01/2009  . Automatic implantable cardioverter-defibrillator in situ 02/01/2009    Past Surgical History:  Procedure Laterality Date  . ABDOMINAL HYSTERECTOMY  1971  . APPENDECTOMY  1956  . CARDIAC DEFIBRILLATOR PLACEMENT     Medtronic  . CATARACT EXTRACTION W/PHACO Left 10/04/2013   Procedure: CATARACT EXTRACTION PHACO AND INTRAOCULAR LENS PLACEMENT (IOC);  Surgeon: Gemma Payor, MD;  Location: [facility];  Service: Ophthalmology;  Laterality: Left;  CDE 9.37  . CATARACT EXTRACTION W/PHACO Right 10/25/2013   Procedure: CATARACT EXTRACTION PHACO AND INTRAOCULAR LENS PLACEMENT (IOC);  Surgeon: Gemma Payor, MD;  Location: [facility];  Service: Ophthalmology;  Laterality: Right;  CDE 8.57  . IMPLANTABLE CARDIOVERTER DEFIBRILLATOR (ICD) GENERATOR CHANGE N/A 04/22/2014   Procedure: ICD GENERATOR CHANGE;  Surgeon: Marinus Maw, MD;  Location: [facility] CATH LAB;  Service: Cardiovascular;  Laterality: N/A;  . JOINT REPLACEMENT    . REPLACEMENT TOTAL KNEE Bilateral    Right-April 2006, Left-April 2011  . TONSILLECTOMY  1952    OB History    No data available       Home Medications  Prior to Admission medications   Medication Sig Start Date End Date Taking? Authorizing Provider  acetaminophen (TYLENOL) 650 MG CR tablet Take 650 mg by mouth every 8 (eight) hours as needed for pain.    Yes [provider]  aspirin 81 MG chewable tablet Chew 81 mg by mouth daily.   Yes [provider]  atorvastatin (LIPITOR) 40 MG tablet Take 40 mg  by mouth at bedtime.    Yes [provider]  cephALEXin (KEFLEX) 250 MG capsule Take 250 mg by mouth 3 (three) times daily. 7 day course starting on 08/30/2017   Yes [provider]  Cholecalciferol (VITAMIN D) 2000 UNITS CAPS Take 2,000 Units by mouth daily.   Yes [provider]  clopidogrel (PLAVIX) 75 MG tablet Take 75 mg by mouth at bedtime.  03/14/15  Yes [provider]  docusate sodium (COLACE) 100 MG capsule Take 100 mg by mouth daily.   Yes [provider]  levothyroxine (SYNTHROID) 100 MCG tablet Take 100 mcg by mouth daily before breakfast.  03/17/15 09/03/17 Yes [provider]  Melatonin 10 MG TABS Take 10 mg by mouth every evening.   Yes [provider]  metoprolol tartrate (LOPRESSOR) 25 MG tablet Take 25 mg by mouth 2 (two) times daily.   Yes [provider]  misoprostol (CYTOTEC) 100 MCG tablet Take 100 mcg by mouth 2 (two) times daily.   Yes [provider]  nitroGLYCERIN (NITROSTAT) 0.4 MG SL tablet Place 0.4 mg under the tongue every 5 (five) minutes as needed for chest pain.   Yes [provider]  polyethylene glycol (MIRALAX / GLYCOLAX) packet Take 17 g by mouth daily. 08/14/17  Yes Dhungel, Nishant, MD  potassium chloride (K-DUR,KLOR-CON) 10 MEQ tablet Take 10 mEq by mouth daily.  03/16/15  Yes [provider]  sertraline (ZOLOFT) 50 MG tablet Take 50 mg by mouth at bedtime.   Yes [provider]  vitamin B-12 (CYANOCOBALAMIN) 1000 MCG tablet Take 1,000 mcg by mouth daily.    Yes [provider]  QUEtiapine (SEROQUEL) 25 MG tablet Take 1 tablet (25 mg total) by mouth 2 (two) times daily. Patient not taking: Reported on 09/03/2017 08/14/17   Eddie Northhungel, Nishant, MD    Family History Family History  Problem Relation Age of Onset  . Stroke Father   . Stroke Mother   . Heart attack Sister   . Heart attack Brother   . Stroke Sister     Social History Social History     Tobacco Use  . Smoking status: Former Smoker    Packs/day: 0.50    Years: 25.00    Pack years: 12.50    Types: Cigarettes    Last attempt to quit: 09/13/2010    Years since quitting: 6.9  . Smokeless tobacco: Never Used  Substance Use Topics  . Alcohol use: No    Alcohol/week: 0.0 oz  . Drug use: No     Allergies   Nsaids; Oxycodone hcl; Povidone-iodine; Prednisone; Amoxicillin-pot clavulanate; Betadine [povidone iodine]; Doxazosin mesylate; Cephalexin; Cymbalta [duloxetine hcl]; Gabapentin; Neomycin; and Tape   Review of Systems Review of Systems  Constitutional: Negative for fever.  Gastrointestinal: Positive for abdominal pain.  All other systems reviewed and are negative.    Physical Exam Updated Vital Signs Pulse 64   Temp 98.1 F (36.7 C) (Oral)   Resp 15   Wt 63.5 kg (140 lb)   SpO2 95%   BMI 27.34 kg/m   Physical  Exam  Constitutional: She appears well-developed and well-nourished.  HENT:  Head: Normocephalic and atraumatic.  Eyes: Conjunctivae and EOM are normal. No scleral icterus.  Neck: Normal range of motion.  Cardiovascular: Normal rate and regular rhythm.  Pulmonary/Chest: Effort normal. No stridor. No respiratory distress.  Abdominal: Normal appearance and bowel sounds are normal. She exhibits no distension, no pulsatile liver and no ascites.  Neurological: She is alert.  Skin: Skin is warm and dry.  Nursing note and vitals reviewed.    ED Treatments / Results  Labs (all labs ordered are listed, but only abnormal results are displayed) Labs Reviewed  CBC WITH DIFFERENTIAL/PLATELET - Abnormal; Notable for the following components:      Result Value   Hemoglobin 10.8 (*)    HCT 35.4 (*)    MCH 25.1 (*)    RDW 16.4 (*)    All other components within normal limits  COMPREHENSIVE METABOLIC PANEL - Abnormal; Notable for the following components:   Glucose, Bld 115 (*)    Total Bilirubin 0.2 (*)    GFR calc non Af Amer 56 (*)    All other  components within normal limits  LIPASE, BLOOD  TROPONIN I    EKG  EKG Interpretation  Date/Time:  Wednesday September 03 2017 22:00:51 EST Ventricular Rate:  62 PR Interval:    QRS Duration: 98 QT Interval:  410 QTC Calculation: 416 R Axis:   19 Text Interpretation:  Atrial fibrillation Moderate voltage criteria for LVH, may be normal variant Inferior infarct , age undetermined Anterior infarct , age undetermined ST & T wave abnormality, consider lateral ischemia Abnormal ECG no obvious changes from [place] 25 Confirmed by Marily Memos [phone]) on 09/12/2017 5:12:55 PM       Radiology Ct Head Wo Contrast  Result Date: 09/03/2017 CLINICAL DATA:  Extreme confusion and altered mental status today. EXAM: CT HEAD WITHOUT CONTRAST TECHNIQUE: Contiguous axial images were obtained from the base of the skull through the vertex without intravenous contrast. COMPARISON:  08/08/2017 FINDINGS: Brain: Encephalomalacia along the left MCA and right PCA distribution with atrophy and chronic small vessel ischemia. Small focal area of encephalomalacia is again noted in the right frontal lobe. No acute intracranial hemorrhage, midline shift or edema. Midline fourth ventricle basal cisterns. No intra-axial mass nor extra-axial fluid collections. Vascular: Atherosclerotic vascular calcifications at the skull base. These are noted along both vertebral arteries, basilar artery, cavernous carotid arteries and left MCA. Skull: No skull fracture or suspicious osseous lesions. Sinuses/Orbits: No acute finding. Other: None IMPRESSION: Superficial and central atrophy associated with moderate degree of chronic small vessel ischemia. Chronic areas of encephalomalacia involving both MCA and right PCA territories. No acute intracranial abnormality no Electronically Signed   By: Tollie Eth M.D.   On: 09/03/2017 22:53    Procedures Procedures (including critical care time)  Medications Ordered in ED Medications    acetaminophen (TYLENOL) tablet 650 mg (650 mg Oral Given 09/03/17 2330)  gi cocktail (Maalox,Lidocaine,Donnatal) (30 mLs Oral Given 09/03/17 2330)     Initial Impression / Assessment and Plan / ED Course  I have reviewed the triage vital signs and the nursing notes.  Pertinent labs & imaging results that were available during my care of the patient were reviewed by me and considered in my medical decision making (see chart for details).    Epigastric pain. No e/o ACS/pancreatitis/hepatobiliary abnormalities. Reflux? Will treat for same but is stable for discharge.  Sleeping. Comfortable. Benign abdomen. Stable for discharge.  Final Clinical Impressions(s) / ED Diagnoses   Final diagnoses:  Epigastric pain    ED Discharge Orders    None       Deborrah Mabin, Barbara Cower, MD 09/03/17 1610    Marily Memos, MD 09/21/17 [phone]

## 2017-10-02 ENCOUNTER — Ambulatory Visit (INDEPENDENT_AMBULATORY_CARE_PROVIDER_SITE_OTHER): Payer: Medicare Other | Admitting: Internal Medicine

## 2017-10-02 ENCOUNTER — Encounter: Payer: Self-pay | Admitting: Internal Medicine

## 2017-10-02 VITALS — BP 132/84 | HR 68 | Ht 60.0 in | Wt 133.2 lb

## 2017-10-02 DIAGNOSIS — Z95810 Presence of automatic (implantable) cardiac defibrillator: Secondary | ICD-10-CM | POA: Diagnosis not present

## 2017-10-02 DIAGNOSIS — I255 Ischemic cardiomyopathy: Secondary | ICD-10-CM | POA: Diagnosis not present

## 2017-10-02 NOTE — Progress Notes (Signed)
HPI [name] rreturns today for followup. She is a 84 year old woman with a history of ventricular fibrillation, and ischemic cardiomyopathy, chronic class II systolic heart failure, status post ICD implantation. In the interim, she has sustained 2 strokes and now has difficulty with speech and reduced vision. She also has emotional lability.  Allergies  Allergen Reactions  . Nsaids Other (See Comments)    Vomited blood   . Oxycodone Hcl Hives and Rash  . Povidone-Iodine Rash  . Prednisone Nausea Only and Other (See Comments)    FLU SYMPTOMS  . Amoxicillin-Pot Clavulanate Other (See Comments)    VOMITING  . Betadine [Povidone Iodine] Rash    rash  . Doxazosin Mesylate Other (See Comments)    INCONTINENCE   . Cephalexin Rash  . Cymbalta [Duloxetine Hcl] Nausea Only and Other (See Comments)    Headache  . Gabapentin Rash  . Neomycin Rash  . Tape Other (See Comments)    Redness     Current Outpatient Medications  Medication Sig Dispense Refill  . acetaminophen (TYLENOL) 650 MG CR tablet Take 650 mg by mouth every 8 (eight) hours as needed for pain.     Marland Kitchen aspirin 81 MG chewable tablet Chew 81 mg by mouth daily.    Marland Kitchen atorvastatin (LIPITOR) 40 MG tablet Take 40 mg by mouth at bedtime.     . Cholecalciferol (VITAMIN D) 2000 UNITS CAPS Take 2,000 Units by mouth daily.    . clopidogrel (PLAVIX) 75 MG tablet Take 75 mg by mouth at bedtime.     . diclofenac sodium (VOLTAREN) 1 % GEL Apply 2 g topically 4 (four) times daily as needed.    . docusate sodium (COLACE) 100 MG capsule Take 100 mg by mouth daily.    Marland Kitchen levothyroxine (SYNTHROID) 100 MCG tablet Take 100 mcg by mouth daily before breakfast.     . metoprolol tartrate (LOPRESSOR) 25 MG tablet Take 25 mg by mouth 2 (two) times daily.    . misoprostol (CYTOTEC) 100 MCG tablet Take 100 mcg by mouth 2 (two) times daily.    . nitroGLYCERIN (NITROSTAT) 0.4 MG SL tablet Place 0.4 mg under the tongue every 5 (five) minutes as  needed for chest pain.    . polyethylene glycol (MIRALAX / GLYCOLAX) packet Take 17 g by mouth as needed.    . potassium chloride (K-DUR,KLOR-CON) 10 MEQ tablet Take 10 mEq by mouth daily.     . sertraline (ZOLOFT) 50 MG tablet Take 50 mg by mouth at bedtime.    . vitamin B-12 (CYANOCOBALAMIN) 1000 MCG tablet Take 1,000 mcg by mouth daily.      No current facility-administered medications for this visit.      Past Medical History:  Diagnosis Date  . Automatic implantable cardiac defibrillator in situ    Medtronic  . B12 deficiency   . Coronary atherosclerosis of native coronary artery    Stent to OM3, DES D1 and RCA 3/12  . Depression   . Essential hypertension, benign   . Hyperlipidemia   . Hypothyroidism   . Ischemic cardiomyopathy    LVEF 35-40%  . Myocardial infarction [facility])    May 2006, March 2012  . Osteopenia   . Spinal stenosis    Chronic back pain    ROS:   All systems reviewed and negative except as noted in the HPI.   Past Surgical History:  Procedure Laterality Date  . ABDOMINAL HYSTERECTOMY  1971  . APPENDECTOMY  1956  .  CARDIAC DEFIBRILLATOR PLACEMENT     Medtronic  . CATARACT EXTRACTION W/PHACO Left 10/04/2013   Procedure: CATARACT EXTRACTION PHACO AND INTRAOCULAR LENS PLACEMENT (IOC);  Surgeon: Gemma PayorKerry Hunt, MD;  Location: [facility];  Service: Ophthalmology;  Laterality: Left;  CDE 9.37  . CATARACT EXTRACTION W/PHACO Right 10/25/2013   Procedure: CATARACT EXTRACTION PHACO AND INTRAOCULAR LENS PLACEMENT (IOC);  Surgeon: Gemma PayorKerry Hunt, MD;  Location: [facility];  Service: Ophthalmology;  Laterality: Right;  CDE 8.57  . IMPLANTABLE CARDIOVERTER DEFIBRILLATOR (ICD) GENERATOR CHANGE N/A 04/22/2014   Procedure: ICD GENERATOR CHANGE;  Surgeon: Marinus MawGregg W Vantasia Pinkney, MD;  [facility];  Service: Cardiovascular;  Laterality: N/A;  . JOINT REPLACEMENT    . REPLACEMENT TOTAL KNEE Bilateral    Right-April 2006, Left-April 2011  . TONSILLECTOMY  1952     Family History    Problem Relation Age of Onset  . Stroke Father   . Stroke Mother   . Heart attack Sister   . Heart attack Brother   . Stroke Sister      Social History   Socioeconomic History  . Marital status: Widowed    Spouse name: Not on file  . Number of children: Not on file  . Years of education: Not on file  . Highest education level: Not on file  Occupational History  . Occupation: RETIRED    Employer: RETIRED  Social Needs  . Financial resource strain: Not on file  . Food insecurity:    Worry: Not on file    Inability: Not on file  . Transportation needs:    Medical: Not on file    Non-medical: Not on file  Tobacco Use  . Smoking status: Former Smoker    Packs/day: 0.50    Years: 25.00    Pack years: 12.50    Types: Cigarettes    Last attempt to quit: 09/13/2010    Years since quitting: 7.0  . Smokeless tobacco: Never Used  Substance and Sexual Activity  . Alcohol use: No    Alcohol/week: 0.0 oz  . Drug use: No  . Sexual activity: Yes    Birth control/protection: Post-menopausal  Lifestyle  . Physical activity:    Days per week: Not on file    Minutes per session: Not on file  . Stress: Not on file  Relationships  . Social connections:    Talks on phone: Not on file    Gets together: Not on file    Attends religious service: Not on file    Active member of club or organization: Not on file    Attends meetings of clubs or organizations: Not on file    Relationship status: Not on file  . Intimate partner violence:    Fear of current or ex partner: Not on file    Emotionally abused: Not on file    Physically abused: Not on file    Forced sexual activity: Not on file  Other Topics Concern  . Not on file  Social History Narrative  . Not on file     BP 132/84   Pulse 68   Ht 5' (1.524 m)   Wt 133 lb 3.2 oz (60.4 kg)   SpO2 97%   BMI 26.01 kg/m   Physical Exam:  Chronically ill appearing 84 yo woman, NAD HEENT: Unremarkable Neck:  6 cm JVD, no  thyromegally Lymphatics:  No adenopathy Back:  No CVA tenderness Lungs:  Clear with no wheezes HEART:  Regular rate rhythm, no murmurs, no  rubs, no clicks Abd:  soft, positive bowel sounds, no organomegally, no rebound, no guarding Ext:  2 plus pulses, no edema, no cyanosis, no clubbing Skin:  No rashes no nodules Neuro:  CN II through XII intact, motor grossly intact  DEVICE  Normal device function.  See PaceArt for details.   Assess/Plan: 1. VF arrest - she has not had any recurrent ventricular arrhythmias. Will follow. 2. ICD - her medtronic ICD is working normally. Will recheck in several months. 3. Stroke - etiology is unclear. She will continue in SNF. She has a very profound expressive aphasia and emotional lability.   Leonia Reeves.D.

## 2017-10-02 NOTE — Patient Instructions (Signed)
Medication Instructions:  Your physician recommends that you continue on your current medications as directed. Please refer to the Current Medication list given to you today.   Labwork: NONE   Testing/Procedures: NONE   Follow-Up: Your physician wants you to follow-up in: 1 Year. You will receive a reminder letter in the mail two months in advance. If you don't receive a letter, please call our office to schedule the follow-up appointment.   Any Other Special Instructions Will Be Listed Below (If Applicable).     If you need a refill on your cardiac medications before your next appointment, please call your pharmacy.  Thank you for choosing [facility]!   

## 2017-10-07 LAB — CUP PACEART INCLINIC DEVICE CHECK
Implantable Lead Implant Date: 20151009
Implantable Lead Location: 753860
Implantable Pulse Generator Implant Date: 20151009
MDC IDC SESS DTM: 20190326155817

## 2017-11-06 ENCOUNTER — Other Ambulatory Visit: Payer: Self-pay

## 2017-11-06 ENCOUNTER — Emergency Department (HOSPITAL_COMMUNITY): Payer: Medicare Other

## 2017-11-06 ENCOUNTER — Encounter (HOSPITAL_COMMUNITY): Payer: Self-pay

## 2017-11-06 ENCOUNTER — Inpatient Hospital Stay (HOSPITAL_COMMUNITY)
Admission: EM | Admit: 2017-11-06 | Discharge: 2017-11-11 | DRG: 689 | Disposition: A | Discharge status: Hospice | Payer: Medicare Other | Attending: Family Medicine | Admitting: Family Medicine

## 2017-11-06 DIAGNOSIS — F0391 Unspecified dementia with behavioral disturbance: Secondary | ICD-10-CM

## 2017-11-06 DIAGNOSIS — Z515 Encounter for palliative care: Secondary | ICD-10-CM

## 2017-11-06 DIAGNOSIS — G9341 Metabolic encephalopathy: Secondary | ICD-10-CM | POA: Diagnosis not present

## 2017-11-06 DIAGNOSIS — Z7902 Long term (current) use of antithrombotics/antiplatelets: Secondary | ICD-10-CM

## 2017-11-06 DIAGNOSIS — E7800 Pure hypercholesterolemia, unspecified: Secondary | ICD-10-CM | POA: Diagnosis present

## 2017-11-06 DIAGNOSIS — N183 Chronic kidney disease, stage 3 unspecified: Secondary | ICD-10-CM | POA: Diagnosis present

## 2017-11-06 DIAGNOSIS — F0390 Unspecified dementia without behavioral disturbance: Secondary | ICD-10-CM

## 2017-11-06 DIAGNOSIS — H9202 Otalgia, left ear: Secondary | ICD-10-CM | POA: Diagnosis present

## 2017-11-06 DIAGNOSIS — E876 Hypokalemia: Secondary | ICD-10-CM | POA: Diagnosis present

## 2017-11-06 DIAGNOSIS — I2510 Atherosclerotic heart disease of native coronary artery without angina pectoris: Secondary | ICD-10-CM | POA: Diagnosis present

## 2017-11-06 DIAGNOSIS — Z8673 Personal history of transient ischemic attack (TIA), and cerebral infarction without residual deficits: Secondary | ICD-10-CM

## 2017-11-06 DIAGNOSIS — N390 Urinary tract infection, site not specified: Principal | ICD-10-CM

## 2017-11-06 DIAGNOSIS — G9340 Encephalopathy, unspecified: Secondary | ICD-10-CM | POA: Diagnosis present

## 2017-11-06 DIAGNOSIS — Z955 Presence of coronary angioplasty implant and graft: Secondary | ICD-10-CM

## 2017-11-06 DIAGNOSIS — I480 Paroxysmal atrial fibrillation: Secondary | ICD-10-CM | POA: Diagnosis present

## 2017-11-06 DIAGNOSIS — R451 Restlessness and agitation: Secondary | ICD-10-CM | POA: Diagnosis not present

## 2017-11-06 DIAGNOSIS — E039 Hypothyroidism, unspecified: Secondary | ICD-10-CM | POA: Diagnosis present

## 2017-11-06 DIAGNOSIS — Z87891 Personal history of nicotine dependence: Secondary | ICD-10-CM

## 2017-11-06 DIAGNOSIS — I130 Hypertensive heart and chronic kidney disease with heart failure and stage 1 through stage 4 chronic kidney disease, or unspecified chronic kidney disease: Secondary | ICD-10-CM | POA: Diagnosis present

## 2017-11-06 DIAGNOSIS — F0151 Vascular dementia with behavioral disturbance: Secondary | ICD-10-CM | POA: Diagnosis present

## 2017-11-06 DIAGNOSIS — Z7982 Long term (current) use of aspirin: Secondary | ICD-10-CM

## 2017-11-06 DIAGNOSIS — R296 Repeated falls: Secondary | ICD-10-CM | POA: Diagnosis present

## 2017-11-06 DIAGNOSIS — I509 Heart failure, unspecified: Secondary | ICD-10-CM | POA: Diagnosis present

## 2017-11-06 DIAGNOSIS — Z7189 Other specified counseling: Secondary | ICD-10-CM

## 2017-11-06 DIAGNOSIS — Z79899 Other long term (current) drug therapy: Secondary | ICD-10-CM

## 2017-11-06 DIAGNOSIS — Z96653 Presence of artificial knee joint, bilateral: Secondary | ICD-10-CM | POA: Diagnosis present

## 2017-11-06 DIAGNOSIS — I252 Old myocardial infarction: Secondary | ICD-10-CM

## 2017-11-06 DIAGNOSIS — J449 Chronic obstructive pulmonary disease, unspecified: Secondary | ICD-10-CM | POA: Diagnosis present

## 2017-11-06 DIAGNOSIS — I255 Ischemic cardiomyopathy: Secondary | ICD-10-CM | POA: Diagnosis present

## 2017-11-06 DIAGNOSIS — B964 Proteus (mirabilis) (morganii) as the cause of diseases classified elsewhere: Secondary | ICD-10-CM | POA: Diagnosis present

## 2017-11-06 DIAGNOSIS — Z95810 Presence of automatic (implantable) cardiac defibrillator: Secondary | ICD-10-CM | POA: Diagnosis present

## 2017-11-06 DIAGNOSIS — Z66 Do not resuscitate: Secondary | ICD-10-CM | POA: Diagnosis present

## 2017-11-06 HISTORY — DX: Other forms of dyspnea: R06.09

## 2017-11-06 HISTORY — DX: Cellulitis, unspecified: L03.90

## 2017-11-06 HISTORY — DX: Disorder of kidney and ureter, unspecified: N28.9

## 2017-11-06 HISTORY — DX: Cerebral infarction, unspecified: I63.9

## 2017-11-06 HISTORY — DX: Polyneuropathy, unspecified: G62.9

## 2017-11-06 HISTORY — DX: Heart failure, unspecified: I50.9

## 2017-11-06 HISTORY — DX: Aphasia: R47.01

## 2017-11-06 HISTORY — DX: Vitamin D deficiency, unspecified: E55.9

## 2017-11-06 HISTORY — DX: Ventricular fibrillation: I49.01

## 2017-11-06 HISTORY — DX: Dyspnea, unspecified: R06.00

## 2017-11-06 HISTORY — DX: Pure hypercholesterolemia, unspecified: E78.00

## 2017-11-06 LAB — BASIC METABOLIC PANEL
Anion gap: 11 (ref 5–15)
BUN: 24 mg/dL — ABNORMAL HIGH (ref 6–20)
CO2: 22 mmol/L (ref 22–32)
Calcium: 9.3 mg/dL (ref 8.9–10.3)
Chloride: 108 mmol/L (ref 101–111)
Creatinine, Ser: 1.02 mg/dL — ABNORMAL HIGH (ref 0.44–1.00)
GFR calc Af Amer: 57 mL/min — ABNORMAL LOW (ref >60)
GFR calc non Af Amer: 49 mL/min — ABNORMAL LOW (ref >60)
Glucose, Bld: 86 mg/dL (ref 65–99)
Potassium: 3.2 mmol/L — ABNORMAL LOW (ref 3.5–5.1)
Sodium: 141 mmol/L (ref 135–145)

## 2017-11-06 LAB — CBC WITH DIFFERENTIAL/PLATELET
Basophils Absolute: 0 10*3/uL (ref 0.0–0.1)
Basophils Relative: 0 %
Eosinophils Absolute: 0.3 10*3/uL (ref 0.0–0.7)
Eosinophils Relative: 3 %
HCT: 36.5 % (ref 36.0–46.0)
Hemoglobin: 11 g/dL — ABNORMAL LOW (ref 12.0–15.0)
Lymphocytes Relative: 18 %
Lymphs Abs: 1.9 10*3/uL (ref 0.7–4.0)
MCH: 22.5 pg — ABNORMAL LOW (ref 26.0–34.0)
MCHC: 30.1 g/dL (ref 30.0–36.0)
MCV: 74.8 fL — ABNORMAL LOW (ref 78.0–100.0)
Monocytes Absolute: 1.1 10*3/uL — ABNORMAL HIGH (ref 0.1–1.0)
Monocytes Relative: 11 %
Neutro Abs: 7.2 10*3/uL (ref 1.7–7.7)
Neutrophils Relative %: 68 %
Platelets: 258 10*3/uL (ref 150–400)
RBC: 4.88 MIL/uL (ref 3.87–5.11)
RDW: 17.4 % — ABNORMAL HIGH (ref 11.5–15.5)
WBC: 10.4 10*3/uL (ref 4.0–10.5)

## 2017-11-06 LAB — URINALYSIS, ROUTINE W REFLEX MICROSCOPIC
BILIRUBIN URINE: NEGATIVE
Glucose, UA: NEGATIVE mg/dL
Ketones, ur: 5 mg/dL — AB
Nitrite: NEGATIVE
PH: 5 (ref 5.0–8.0)
Protein, ur: 100 mg/dL — AB
SPECIFIC GRAVITY, URINE: 1.024 (ref 1.005–1.030)
WBC, UA: >50 WBC/hpf — ABNORMAL HIGH (ref 0–5)

## 2017-11-06 LAB — TSH: TSH: 1.304 u[IU]/mL (ref 0.350–4.500)

## 2017-11-06 LAB — CBG MONITORING, ED: GLUCOSE-CAPILLARY: 78 mg/dL (ref 65–99)

## 2017-11-06 MED ORDER — CLOPIDOGREL BISULFATE 75 MG PO TABS: 75.0000 mg | ORAL_TABLET | Freq: Every day | ORAL | Status: DC

## 2017-11-06 MED ORDER — NITROFURANTOIN MONOHYD MACRO 100 MG PO CAPS: 100.0000 mg | ORAL_CAPSULE | Freq: Two times a day (BID) | ORAL | Status: DC

## 2017-11-06 MED ORDER — METOPROLOL TARTRATE 25 MG PO TABS: 25.0000 mg | ORAL_TABLET | Freq: Two times a day (BID) | ORAL | Status: DC

## 2017-11-06 MED ORDER — ACETAMINOPHEN 650 MG RE SUPP: 650.0000 mg | Freq: Four times a day (QID) | RECTAL | Status: DC | PRN

## 2017-11-06 MED ORDER — LEVOTHYROXINE SODIUM 100 MCG IV SOLR
50.0000 ug | Freq: Every day | INTRAVENOUS | Status: DC
  Administered 2017-11-07 – 2017-11-11 (×5): 50 ug via INTRAVENOUS
  Filled 2017-11-06 (×7): qty 5

## 2017-11-06 MED ORDER — ONDANSETRON HCL 4 MG/2ML IJ SOLN: 4.0000 mg | Freq: Four times a day (QID) | INTRAMUSCULAR | Status: DC | PRN

## 2017-11-06 MED ORDER — METOPROLOL TARTRATE 5 MG/5ML IV SOLN
INTRAVENOUS | Status: AC
  Administered 2017-11-06: 2.5 mg via INTRAVENOUS
  Filled 2017-11-06: qty 5

## 2017-11-06 MED ORDER — NITROGLYCERIN 0.4 MG SL SUBL: 0.4000 mg | SUBLINGUAL_TABLET | SUBLINGUAL | Status: DC | PRN

## 2017-11-06 MED ORDER — ASPIRIN 81 MG PO CHEW: 81.0000 mg | CHEWABLE_TABLET | Freq: Every day | ORAL | Status: DC

## 2017-11-06 MED ORDER — SERTRALINE HCL 50 MG PO TABS: 100.0000 mg | ORAL_TABLET | Freq: Every day | ORAL | Status: DC

## 2017-11-06 MED ORDER — MISOPROSTOL 100 MCG PO TABS: 100.0000 ug | ORAL_TABLET | Freq: Two times a day (BID) | ORAL | Status: DC

## 2017-11-06 MED ORDER — SODIUM CHLORIDE 0.9 % IV SOLN
1.0000 g | Freq: Once | INTRAVENOUS | Status: AC
  Administered 2017-11-06: 1 g via INTRAVENOUS
  Filled 2017-11-06: qty 10

## 2017-11-06 MED ORDER — POTASSIUM CHLORIDE 10 MEQ/100ML IV SOLN
10.0000 meq | INTRAVENOUS | Status: AC
  Administered 2017-11-06 – 2017-11-07 (×4): 10 meq via INTRAVENOUS
  Filled 2017-11-06 (×4): qty 100

## 2017-11-06 MED ORDER — ACETAMINOPHEN 325 MG PO TABS: 650.0000 mg | ORAL_TABLET | Freq: Four times a day (QID) | ORAL | Status: DC | PRN

## 2017-11-06 MED ORDER — QUETIAPINE FUMARATE 25 MG PO TABS: 25.0000 mg | ORAL_TABLET | Freq: Two times a day (BID) | ORAL | Status: DC

## 2017-11-06 MED ORDER — ENOXAPARIN SODIUM 40 MG/0.4ML ~~LOC~~ SOLN
40.0000 mg | SUBCUTANEOUS | Status: DC
  Administered 2017-11-06 – 2017-11-10 (×5): 40 mg via SUBCUTANEOUS
  Filled 2017-11-06 (×5): qty 0.4

## 2017-11-06 MED ORDER — VITAMIN B-12 1000 MCG PO TABS: 1000.0000 ug | ORAL_TABLET | Freq: Every day | ORAL | Status: DC

## 2017-11-06 MED ORDER — ATORVASTATIN CALCIUM 40 MG PO TABS: 40.0000 mg | ORAL_TABLET | Freq: Every day | ORAL | Status: DC

## 2017-11-06 MED ORDER — LACTATED RINGERS IV SOLN
INTRAVENOUS | Status: DC
  Administered 2017-11-06: 23:00:00 via INTRAVENOUS

## 2017-11-06 MED ORDER — DOCUSATE SODIUM 100 MG PO CAPS: 100.0000 mg | ORAL_CAPSULE | Freq: Every day | ORAL | Status: DC

## 2017-11-06 MED ORDER — VITAMIN D 50 MCG (2000 UT) PO CAPS: 2000.0000 [IU] | ORAL_CAPSULE | Freq: Every day | ORAL | Status: DC

## 2017-11-06 MED ORDER — POLYETHYLENE GLYCOL 3350 17 G PO PACK
17.0000 g | PACK | Freq: Every day | ORAL | Status: DC | PRN
Start: 2017-11-06 — End: 2017-11-06

## 2017-11-06 MED ORDER — METOPROLOL TARTRATE 5 MG/5ML IV SOLN
2.5000 mg | Freq: Four times a day (QID) | INTRAVENOUS | Status: DC
  Administered 2017-11-06 – 2017-11-11 (×20): 2.5 mg via INTRAVENOUS
  Filled 2017-11-06 (×17): qty 5

## 2017-11-06 MED ORDER — LEVOTHYROXINE SODIUM 50 MCG PO TABS: 100.0000 ug | ORAL_TABLET | Freq: Every day | ORAL | Status: DC

## 2017-11-06 MED ORDER — HALOPERIDOL LACTATE 5 MG/ML IJ SOLN
5.0000 mg | Freq: Four times a day (QID) | INTRAMUSCULAR | Status: DC | PRN
  Administered 2017-11-07 – 2017-11-09 (×5): 5 mg via INTRAVENOUS
  Filled 2017-11-06 (×5): qty 1

## 2017-11-06 MED ORDER — ONDANSETRON HCL 4 MG PO TABS: 4.0000 mg | ORAL_TABLET | Freq: Four times a day (QID) | ORAL | Status: DC | PRN

## 2017-11-06 MED ORDER — POTASSIUM CHLORIDE CRYS ER 20 MEQ PO TBCR: 10.0000 meq | EXTENDED_RELEASE_TABLET | Freq: Every day | ORAL | Status: DC

## 2017-11-06 NOTE — ED Triage Notes (Signed)
Ems reports pt resident of [facility] and has had agitation and frequent falls.  EMS says pcp has been changing some medications around for agitation but reports isn't helping.  EMS says pt has fallen 2 times today.  Pt tearful.

## 2017-11-06 NOTE — ED Triage Notes (Signed)
Ems says pt has history of tia's and are concerned about vascular dementia.

## 2017-11-06 NOTE — ED Provider Notes (Addendum)
[facility] EMERGENCY DEPARTMENT Provider Note   CSN: [id number] Arrival date & time: 11/06/17  1835     History   Chief Complaint Chief Complaint  Patient presents with  . Fall  . Agitation    HPI [name] is a 84 y.o. female.  Level 5 caveat for dementia and agitation.  Patient resides at a family care center.  They report she has been falling more recently with increased agitation.  They are having difficulty taking care of her.  There have been several medication changes in the past few months in an attempt to control her behavior.     Past Medical History:  Diagnosis Date  . Aphasia   . Automatic implantable cardiac defibrillator in situ    Medtronic  . Avitaminosis D   . B12 deficiency   . Cellulitis   . CHF (congestive heart failure) (HCC)   . COPD (chronic obstructive pulmonary disease) (HCC)   . Coronary atherosclerosis of native coronary artery    Stent to OM3, DES D1 and RCA 3/12  . Depression   . Essential hypertension, benign   . Exertional dyspnea   . Hypercholesterolemia   . Hyperlipidemia   . Hypothyroidism   . Ischemic cardiomyopathy    LVEF 35-40%  . Myocardial infarction [facility])    May 2006, March 2012  . Neuropathy   . Osteopenia   . Renal disorder   . Spinal stenosis    Chronic back pain  . Stroke (HCC)   . Ventricular fibrillation [facility])     Patient Active Problem List   Diagnosis Date Noted  . Dementia 11/06/2017  . Acute metabolic encephalopathy 11/06/2017  . Acute lower UTI 11/06/2017  . Proteus mirabilis infection/UTI 08/10/2017  . AF (paroxysmal atrial fibrillation) (HCC) 08/09/2017  . Hypokalemia 08/09/2017  . Toxic Acute encephalopathy due to UTI 08/09/2017  . CKD (chronic kidney disease), stage III (HCC) 08/09/2017  . CAD in native artery 03/15/2015  . Cerebrovascular accident, old 03/15/2015  . Hypothyroidism due to Hashimoto's thyroiditis 03/15/2015  . Neuropathy 03/15/2015  . Avitaminosis D 03/15/2015  .  Hypercholesterolemia without hypertriglyceridemia 03/15/2015  . Pain in joint, shoulder region 02/13/2015  . Cellulitis 01/28/2015  . Renal insufficiency 01/06/2015  . Hypothyroidism 12/28/2014  . Expressive aphasia 12/28/2014  . Acute ischemic left MCA stroke (HCC) 12/26/2014  . Exertional dyspnea 05/23/2014  . Spider veins of both lower extremities 05/10/2014  . Varicose veins of leg with complications 05/10/2014  . ICD (implantable cardioverter-defibrillator) in place 04/22/2014  . VF (ventricular fibrillation) (HCC) 03/29/2014  . Mixed hyperlipidemia 04/21/2013  . Chronic systolic heart failure (HCC) 03/26/2013  . Ischemic cardiomyopathy   . Coronary atherosclerosis of native coronary artery   . COPD 02/27/2009  . SPINAL STENOSIS 02/27/2009  . Essential hypertension, benign 02/01/2009  . Automatic implantable cardioverter-defibrillator in situ 02/01/2009    Past Surgical History:  Procedure Laterality Date  . ABDOMINAL HYSTERECTOMY  1971  . APPENDECTOMY  1956  . CARDIAC DEFIBRILLATOR PLACEMENT     Medtronic  . CATARACT EXTRACTION W/PHACO Left 10/04/2013   Procedure: CATARACT EXTRACTION PHACO AND INTRAOCULAR LENS PLACEMENT (IOC);  Surgeon: Gemma Payor, MD;  Location: [facility];  Service: Ophthalmology;  Laterality: Left;  CDE 9.37  . CATARACT EXTRACTION W/PHACO Right 10/25/2013   Procedure: CATARACT EXTRACTION PHACO AND INTRAOCULAR LENS PLACEMENT (IOC);  Surgeon: Gemma Payor, MD;  Location: [facility];  Service: Ophthalmology;  Laterality: Right;  CDE 8.57  . IMPLANTABLE CARDIOVERTER DEFIBRILLATOR (ICD) GENERATOR CHANGE  N/A 04/22/2014   Procedure: ICD GENERATOR CHANGE;  Surgeon: Marinus Maw, MD;  Location: [facility] CATH LAB;  Service: Cardiovascular;  Laterality: N/A;  . JOINT REPLACEMENT    . REPLACEMENT TOTAL KNEE Bilateral    Right-April 2006, Left-April 2011  . TONSILLECTOMY  1952     OB History   None      Home Medications    Prior to Admission medications   Medication Sig  Start Date End Date Taking? Authorizing Provider  acetaminophen (TYLENOL) 650 MG CR tablet Take 650 mg by mouth every 8 (eight) hours as needed for pain.    Yes [provider]  aspirin 81 MG chewable tablet Chew 81 mg by mouth daily.   Yes [provider]  atorvastatin (LIPITOR) 40 MG tablet Take 40 mg by mouth at bedtime.    Yes [provider]  Cholecalciferol (VITAMIN D) 2000 UNITS CAPS Take 2,000 Units by mouth daily.   Yes [provider]  clopidogrel (PLAVIX) 75 MG tablet Take 75 mg by mouth at bedtime.  03/14/15  Yes [provider]  diclofenac sodium (VOLTAREN) 1 % GEL Apply 2 g topically 4 (four) times daily as needed.   Yes [provider]  docusate sodium (COLACE) 100 MG capsule Take 100 mg by mouth daily.   Yes [provider]  levothyroxine (SYNTHROID) 100 MCG tablet Take 100 mcg by mouth daily before breakfast.  03/17/15 10/03/18 Yes [provider]  metoprolol tartrate (LOPRESSOR) 25 MG tablet Take 25 mg by mouth 2 (two) times daily.   Yes [provider]  misoprostol (CYTOTEC) 100 MCG tablet Take 100 mcg by mouth 2 (two) times daily.   Yes [provider]  nitroGLYCERIN (NITROSTAT) 0.4 MG SL tablet Place 0.4 mg under the tongue every 5 (five) minutes as needed for chest pain.   Yes [provider]  polyethylene glycol (MIRALAX / GLYCOLAX) packet Take 17 g by mouth as needed.   Yes [provider]  potassium chloride (K-DUR,KLOR-CON) 10 MEQ tablet Take 10 mEq by mouth daily.  03/16/15  Yes [provider]  sertraline (ZOLOFT) 100 MG tablet Take 100 mg by mouth at bedtime.    Yes [provider]  vitamin B-12 (CYANOCOBALAMIN) 1000 MCG tablet Take 1,000 mcg by mouth daily.    Yes [provider]    Family History Family History  Problem Relation Age of Onset  . Stroke Father   . Stroke Mother   . Heart attack Sister   . Heart attack Brother   . Stroke  Sister     Social History Social History   Tobacco Use  . Smoking status: Former Smoker    Packs/day: 0.50    Years: 25.00    Pack years: 12.50    Types: Cigarettes    Last attempt to quit: 09/13/2010    Years since quitting: 7.1  . Smokeless tobacco: Never Used  Substance Use Topics  . Alcohol use: No    Alcohol/week: 0.0 oz  . Drug use: No     Allergies   Nsaids; Oxycodone hcl; Povidone-iodine; Prednisone; Amoxicillin-pot clavulanate; Betadine [povidone iodine]; Doxazosin mesylate; Cephalexin; Cymbalta [duloxetine hcl]; Gabapentin; Neomycin; and Tape   Review of Systems Review of Systems  Unable to perform ROS: Dementia     Physical Exam Updated Vital Signs BP 128/81   Pulse 77   Temp (!) 97.1 F (36.2 C) (Oral)   Resp 16   Ht 5' (1.524 m)   Wt  60.3 kg (133 lb)   SpO2 97%   BMI 25.97 kg/m   Physical Exam  Constitutional:  Restless, mumbling  HENT:  Head: Normocephalic and atraumatic.  Eyes: Conjunctivae are normal.  Neck: Neck supple.  Cardiovascular: Normal rate and regular rhythm.  Pulmonary/Chest: Effort normal and breath sounds normal.  Abdominal: Soft. Bowel sounds are normal.  Musculoskeletal:  Ecchymosis distal right upper extremity  Neurological: She is alert.  Skin: Skin is warm and dry.  Psychiatric:  Demented, agitated  Nursing note and vitals reviewed.    ED Treatments / Results  Labs (all labs ordered are listed, but only abnormal results are displayed) Labs Reviewed  URINALYSIS, ROUTINE W REFLEX MICROSCOPIC - Abnormal; Notable for the following components:      Result Value   APPearance CLOUDY (*)    Hgb urine dipstick MODERATE (*)    Ketones, ur 5 (*)    Protein, ur 100 (*)    Leukocytes, UA LARGE (*)    WBC, UA >50 (*)    Bacteria, UA RARE (*)    All other components within normal limits  CBC WITH DIFFERENTIAL/PLATELET - Abnormal; Notable for the following components:   Hemoglobin 11.0 (*)    MCV 74.8 (*)    MCH 22.5  (*)    RDW 17.4 (*)    Monocytes Absolute 1.1 (*)    All other components within normal limits  BASIC METABOLIC PANEL - Abnormal; Notable for the following components:   Potassium 3.2 (*)    BUN 24 (*)    Creatinine, Ser 1.02 (*)    GFR calc non Af Amer 49 (*)    GFR calc Af Amer 57 (*)    All other components within normal limits  URINE CULTURE  CBG MONITORING, ED    EKG None  Radiology Ct Head Wo Contrast  Result Date: 11/06/2017 CLINICAL DATA:  Altered level of consciousness. EXAM: CT HEAD WITHOUT CONTRAST TECHNIQUE: Contiguous axial images were obtained from the base of the skull through the vertex without intravenous contrast. COMPARISON:  CT scan of September 03, 2017. FINDINGS: Brain: Stable right occipital and left frontal encephalomalacia is noted consistent with old infarctions. Mild diffuse cortical atrophy is noted. Mild chronic ischemic white matter disease is noted. No mass effect or midline shift is noted. Ventricular size is within normal limits. There is no evidence of mass lesion, hemorrhage or acute infarction. Vascular: Atherosclerosis of intracranial internal carotid arteries is noted. Skull: Normal. Negative for fracture or focal lesion. Sinuses/Orbits: No acute finding. Other: None. IMPRESSION: Mild diffuse cortical atrophy. Mild chronic ischemic white matter disease. Stable right occipital and left frontal encephalomalacia. No acute intracranial abnormality seen. Electronically Signed   By: Lupita RaiderJames  Green Jr, M.D.   On: 11/06/2017 20:19    Procedures Procedures (including critical care time)  Medications Ordered in ED Medications  cefTRIAXone (ROCEPHIN) 1 g in sodium chloride 0.9 % 100 mL IVPB (has no administration in time range)     Initial Impression / Assessment and Plan / ED Course  I have reviewed the triage vital signs and the nursing notes.  Pertinent labs & imaging results that were available during my care of the patient were reviewed by me and  considered in my medical decision making (see chart for details).    Demented patient presents with increased agitation and falling.  CT head shows no subdural hematoma. Urinalysis infected.  IV Rocephin.  Urine culture.  Admit to general medicine.  Patient may need to move  to a higher level care.   CRITICAL CARE Performed by: Donnetta Hutching Total critical care time: 30 minutes Critical care time was exclusive of separately billable procedures and treating other patients. Critical care was necessary to treat or prevent imminent or life-threatening deterioration. Critical care was time spent personally by me on the following activities: development of treatment plan with patient and/or surrogate as well as nursing, discussions with consultants, evaluation of patient's response to treatment, examination of patient, obtaining history from patient or surrogate, ordering and performing treatments and interventions, ordering and review of laboratory studies, ordering and review of radiographic studies, pulse oximetry and re-evaluation of patient's condition.  Final Clinical Impressions(s) / ED Diagnoses   Final diagnoses:  Urinary tract infection without hematuria, site unspecified  Dementia with behavioral disturbance, unspecified dementia type  Agitation    ED Discharge Orders    None       Donnetta Hutching, MD 11/06/17 2203    Donnetta Hutching, MD 11/15/17 [phone]

## 2017-11-06 NOTE — H&P (Addendum)
History and Physical    [name] ZOX:[id number] DOB: [birth year] DOA: 11/06/2017  PCP: Benita Stabile, MD   Patient coming from: Group home  Chief Complaint: Progressive decline with frequent falls and agitation  HPI: [name] is a 84 y.o. female with medical history significant for ischemic cardiomyopathy with AICD, paroxysmal atrial fibrillation, CKD stage III, hypothyroidism, multi-infarct dementia with agitation, and physical deconditioning with high risk of falls who was brought to the emergency department for worsening agitation and behavioral disturbances coupled with frequent falls.  Brother and sister at the bedside are primary historians who states that patient has been essentially having worsening behavioral disturbances over the last several weeks and has been more somnolent as well.  They state that she feels as though she is steadily declining and brought her to the ED today on account of agitations that cannot be controlled at her group home.  Patient is currently somnolent and calm, but cannot give any adequate history.   ED Course: Vital signs initially demonstrated some hypertension which is now well controlled.  Laboratory data demonstrates potassium 3.2, hemoglobin 11, BUN 24, creatinine 1.02 which is currently at baseline.  CT of the head demonstrates diffuse atrophy with no other acute findings.  Urine analysis has findings of possible UTI.  She has been given some Macrobid and will be started on Rocephin.  Notably, patient has had prior Proteus UTI which led to encephalopathy and delirium and was discharged on 07/2017 after treatment.  Review of Systems: Cannot be obtained due to patient condition.  Past Medical History:  Diagnosis Date  . Aphasia   . Automatic implantable cardiac defibrillator in situ    Medtronic  . Avitaminosis D   . B12 deficiency   . Cellulitis   . CHF (congestive heart failure) (HCC)   . COPD (chronic obstructive pulmonary disease)  (HCC)   . Coronary atherosclerosis of native coronary artery    Stent to OM3, DES D1 and RCA 3/12  . Depression   . Essential hypertension, benign   . Exertional dyspnea   . Hypercholesterolemia   . Hyperlipidemia   . Hypothyroidism   . Ischemic cardiomyopathy    LVEF 35-40%  . Myocardial infarction [facility])    May 2006, March 2012  . Neuropathy   . Osteopenia   . Renal disorder   . Spinal stenosis    Chronic back pain  . Stroke (HCC)   . Ventricular fibrillation [facility])     Past Surgical History:  Procedure Laterality Date  . ABDOMINAL HYSTERECTOMY  1971  . APPENDECTOMY  1956  . CARDIAC DEFIBRILLATOR PLACEMENT     Medtronic  . CATARACT EXTRACTION W/PHACO Left 10/04/2013   Procedure: CATARACT EXTRACTION PHACO AND INTRAOCULAR LENS PLACEMENT (IOC);  Surgeon: Gemma Payor, MD;  Location: [facility];  Service: Ophthalmology;  Laterality: Left;  CDE 9.37  . CATARACT EXTRACTION W/PHACO Right 10/25/2013   Procedure: CATARACT EXTRACTION PHACO AND INTRAOCULAR LENS PLACEMENT (IOC);  Surgeon: Gemma Payor, MD;  Location: [facility];  Service: Ophthalmology;  Laterality: Right;  CDE 8.57  . IMPLANTABLE CARDIOVERTER DEFIBRILLATOR (ICD) GENERATOR CHANGE N/A 04/22/2014   Procedure: ICD GENERATOR CHANGE;  Surgeon: Marinus Maw, MD;  Location: [facility] CATH LAB;  Service: Cardiovascular;  Laterality: N/A;  . JOINT REPLACEMENT    . REPLACEMENT TOTAL KNEE Bilateral    Right-April 2006, Left-April 2011  . TONSILLECTOMY  1952     reports that she quit smoking about 7 years ago. Her smoking use included  cigarettes. She has a 12.50 pack-year smoking history. She has never used smokeless tobacco. She reports that she does not drink alcohol or use drugs.  Allergies  Allergen Reactions  . Nsaids Other (See Comments)    Vomited blood   . Oxycodone Hcl Hives and Rash  . Povidone-Iodine Rash  . Prednisone Nausea Only and Other (See Comments)    FLU SYMPTOMS  . Amoxicillin-Pot Clavulanate Other (See Comments)     VOMITING  . Betadine [Povidone Iodine] Rash    rash  . Doxazosin Mesylate Other (See Comments)    INCONTINENCE   . Cephalexin Rash  . Cymbalta [Duloxetine Hcl] Nausea Only and Other (See Comments)    Headache  . Gabapentin Rash  . Neomycin Rash  . Tape Other (See Comments)    Redness    Family History  Problem Relation Age of Onset  . Stroke Father   . Stroke Mother   . Heart attack Sister   . Heart attack Brother   . Stroke Sister     Prior to Admission medications   Medication Sig Start Date End Date Taking? Authorizing Provider  acetaminophen (TYLENOL) 650 MG CR tablet Take 650 mg by mouth every 8 (eight) hours as needed for pain.    Yes [provider]  aspirin 81 MG chewable tablet Chew 81 mg by mouth daily.   Yes [provider]  atorvastatin (LIPITOR) 40 MG tablet Take 40 mg by mouth at bedtime.    Yes [provider]  Cholecalciferol (VITAMIN D) 2000 UNITS CAPS Take 2,000 Units by mouth daily.   Yes [provider]  clopidogrel (PLAVIX) 75 MG tablet Take 75 mg by mouth at bedtime.  03/14/15  Yes [provider]  diclofenac sodium (VOLTAREN) 1 % GEL Apply 2 g topically 4 (four) times daily as needed.   Yes [provider]  docusate sodium (COLACE) 100 MG capsule Take 100 mg by mouth daily.   Yes [provider]  levothyroxine (SYNTHROID) 100 MCG tablet Take 100 mcg by mouth daily before breakfast.  03/17/15 10/03/18 Yes [provider]  metoprolol tartrate (LOPRESSOR) 25 MG tablet Take 25 mg by mouth 2 (two) times daily.   Yes [provider]  misoprostol (CYTOTEC) 100 MCG tablet Take 100 mcg by mouth 2 (two) times daily.   Yes [provider]  nitroGLYCERIN (NITROSTAT) 0.4 MG SL tablet Place 0.4 mg under the tongue every 5 (five) minutes as needed for chest pain.   Yes [provider]  polyethylene glycol (MIRALAX / GLYCOLAX) packet Take 17 g by mouth as needed.   Yes [provider]  potassium chloride (K-DUR,KLOR-CON) 10 MEQ tablet Take 10 mEq by mouth daily.  03/16/15  Yes [provider]  sertraline (ZOLOFT) 100 MG tablet Take 100 mg by mouth at bedtime.    Yes [provider]  vitamin B-12 (CYANOCOBALAMIN) 1000 MCG tablet Take 1,000 mcg by mouth daily.    Yes [provider]    Physical Exam: Vitals:   11/06/17 2000 11/06/17 2000 11/06/17 2030 11/06/17 2100  BP: (!) 181/109  (!) 157/105 128/81  Pulse: (!) 58  70 77  Resp: 16  16 16   Temp:      TempSrc:      SpO2: 100%  100% 97%  Weight:  60.3 kg (133 lb)    Height:  5' (1.524 m)      Constitutional: Somnolent, but arousable Vitals:   11/06/17 2000 11/06/17 2000 11/06/17  2030 11/06/17 2100  BP: (!) 181/109  (!) 157/105 128/81  Pulse: (!) 58  70 77  Resp: 16  16 16   Temp:      TempSrc:      SpO2: 100%  100% 97%  Weight:  60.3 kg (133 lb)    Height:  5' (1.524 m)     Eyes: lids and conjunctivae normal ENMT: Mucous membranes are moist.  Neck: normal, supple Respiratory: clear to auscultation bilaterally. Normal respiratory effort. No accessory muscle use.  Cardiovascular: Regular rate and rhythm, no murmurs. No extremity edema. Abdomen: no tenderness, no distention. Bowel sounds positive.  Musculoskeletal:  No joint deformity upper and lower extremities.   Skin: no rashes, lesions, ulcers.  Ecchymosis noted near her wrists.  Labs on Admission: I have personally reviewed following labs and imaging studies  CBC: Recent Labs  Lab 11/06/17 2007  WBC 10.4  NEUTROABS 7.2  HGB 11.0*  HCT 36.5  MCV 74.8*  PLT 258   Basic Metabolic Panel: Recent Labs  Lab 11/06/17 2007  NA 141  K 3.2*  CL 108  CO2 22  GLUCOSE 86  BUN 24*  CREATININE 1.02*  CALCIUM 9.3   GFR: Estimated Creatinine Clearance: 33.3 mL/min (A) (by C-G formula based on SCr of 1.02 mg/dL (H)). Liver Function Tests: No results for input(s): AST, ALT, ALKPHOS, BILITOT, PROT, ALBUMIN in  the last 168 hours. No results for input(s): LIPASE, AMYLASE in the last 168 hours. No results for input(s): AMMONIA in the last 168 hours. Coagulation Profile: No results for input(s): INR, PROTIME in the last 168 hours. Cardiac Enzymes: No results for input(s): CKTOTAL, CKMB, CKMBINDEX, TROPONINI in the last 168 hours. BNP (last 3 results) No results for input(s): PROBNP in the last 8760 hours. HbA1C: No results for input(s): HGBA1C in the last 72 hours. CBG: Recent Labs  Lab 11/06/17 1934  GLUCAP 78   Lipid Profile: No results for input(s): CHOL, HDL, LDLCALC, TRIG, CHOLHDL, LDLDIRECT in the last 72 hours. Thyroid Function Tests: No results for input(s): TSH, T4TOTAL, FREET4, T3FREE, THYROIDAB in the last 72 hours. Anemia Panel: No results for input(s): VITAMINB12, FOLATE, FERRITIN, TIBC, IRON, RETICCTPCT in the last 72 hours. Urine analysis:    Component Value Date/Time   COLORURINE YELLOW 11/06/2017 1929   APPEARANCEUR CLOUDY (A) 11/06/2017 1929   LABSPEC 1.024 11/06/2017 1929   PHURINE 5.0 11/06/2017 1929   GLUCOSEU NEGATIVE 11/06/2017 1929   HGBUR MODERATE (A) 11/06/2017 1929   BILIRUBINUR NEGATIVE 11/06/2017 1929   KETONESUR 5 (A) 11/06/2017 1929   PROTEINUR 100 (A) 11/06/2017 1929   UROBILINOGEN 1.0 12/30/2014 1930   NITRITE NEGATIVE 11/06/2017 1929   LEUKOCYTESUR LARGE (A) 11/06/2017 1929    Radiological Exams on Admission: Ct Head Wo Contrast  Result Date: 11/06/2017 CLINICAL DATA:  Altered level of consciousness. EXAM: CT HEAD WITHOUT CONTRAST TECHNIQUE: Contiguous axial images were obtained from the base of the skull through the vertex without intravenous contrast. COMPARISON:  CT scan of September 03, 2017. FINDINGS: Brain: Stable right occipital and left frontal encephalomalacia is noted consistent with old infarctions. Mild diffuse cortical atrophy is noted. Mild chronic ischemic white matter disease is noted. No mass effect or midline shift is noted.  Ventricular size is within normal limits. There is no evidence of mass lesion, hemorrhage or acute infarction. Vascular: Atherosclerosis of intracranial internal carotid arteries is noted. Skull: Normal. Negative for fracture or focal lesion. Sinuses/Orbits: No acute finding. Other: None. IMPRESSION: Mild diffuse cortical atrophy. Mild  chronic ischemic white matter disease. Stable right occipital and left frontal encephalomalacia. No acute intracranial abnormality seen. Electronically Signed   By: Lupita Raider, M.D.   On: 11/06/2017 20:19    Assessment/Plan Principal Problem:   Acute metabolic encephalopathy Active Problems:   Automatic implantable cardioverter-defibrillator in situ   Hypothyroidism   Cerebrovascular accident, old   AF (paroxysmal atrial fibrillation) (HCC)   Hypokalemia   CKD (chronic kidney disease), stage III (HCC)   Dementia   Acute lower UTI    1. Acute metabolic encephalopathy likely secondary to UTI.  Check urine cultures and place on IV Rocephin empirically for treatment.  Patient has had prior Proteus infection that was sensitive to Rocephin.  Maintain on neurochecks with sitter at bedside due to behavioral disturbances and agitation.  Consider TTS consultation if patient does not improve.  Maintain on Seroquel 25mg  bid as previously and provide Haldol as needed.  She has had TSH checked in January as well as ammonia and B12 which were normal.  RPR was also noted to be nonreactive at that time.  N.p.o. except meds for now with swallow screen. 2. Progressive multi-infarct dementia with episodes of acute delirium.  Attention to palliative care for goals of care discussion with family members.  Prognosis is poor overall. 3. Hypokalemia.  Potassium supplementation IV. Check Mg in am. 4. Paroxysmal atrial fibrillation.  Telemetry monitoring and continue on metoprolol as well as dual antiplatelet therapy.  She is not a candidate for anticoagulation due to her frequent  falls. 5. Ischemic cardiomyopathy with CAD.  Continue aspirin, Plavix, beta-blocker, and statin. 6. Hypothyroidism.  Continue Synthroid. Will recheck TSH. 7. CKD stage III.  Renal function stable. 8. Physical deconditioning with frequent falls.  Fall precautions and PT evaluation due to change in condition.  Addendum: Patient has failed stroke swallow screen and therefore I have discontinued home medications.  Levothyroxine to IV and metoprolol IV every 6 hours for now.  DVT prophylaxis: Lovenox Code Status: DNR Family Communication: Brother and sister at the bedside Disposition Plan:UTI treatment and monitoring of encephalopathy Consults called:Palliative Care Admission status: Obs, med-surg   Erick Blinks DO [facility] Pager [phone]  If 7PM-7AM, please contact night-coverage [email or web address] Password [facility]  11/06/2017, 9:44 PM

## 2017-11-06 NOTE — Progress Notes (Addendum)
Pharmacy Antibiotic Note  [name] is a 84 y.o. female admitted on 11/06/2017 with UTI.  Pharmacy has been consulted for ceftriaxone dosing.  Plan: Ceftriaxone 1gm IV given in ED, pt with keflex allergy (rash). F/U in AM with abx F/U cxs and clinical progress Monitor V/S and labs  Height: 5' (152.4 cm) Weight: 133 lb (60.3 kg) IBW/kg (Calculated) : 45.5  Temp (24hrs), Avg:97.1 F (36.2 C), Min:97.1 F (36.2 C), Max:97.1 F (36.2 C)  Recent Labs  Lab 11/06/17 2007  WBC 10.4  CREATININE 1.02*    Estimated Creatinine Clearance: 33.3 mL/min (A) (by C-G formula based on SCr of 1.02 mg/dL (H)).    Allergies  Allergen Reactions  . Nsaids Other (See Comments)    Vomited blood   . Oxycodone Hcl Hives and Rash  . Povidone-Iodine Rash  . Prednisone Nausea Only and Other (See Comments)    FLU SYMPTOMS  . Amoxicillin-Pot Clavulanate Other (See Comments)    VOMITING  . Betadine [Povidone Iodine] Rash    rash  . Doxazosin Mesylate Other (See Comments)    INCONTINENCE   . Cephalexin Rash  . Cymbalta [Duloxetine Hcl] Nausea Only and Other (See Comments)    Headache  . Gabapentin Rash  . Neomycin Rash  . Tape Other (See Comments)    Redness    Antimicrobials this admission: Ceftriaxone 4/25 >>   Microbiology results: 4/25 UCx: pending  Thank you for allowing pharmacy to be a part of this patient's care.  Elder CyphersLorie Ayansh Feutz, BS Loura Backharm D, [place] Clinical Pharmacist Pager [phone] 11/06/2017 10:37 PM   Addendum:  Patient has tolerated ceftin for several days in January 2019.  D/W MD and will continue Rocephin 1gm IV q24h and watch closely  Elder CyphersLorie Donoven Pett, BS Loura BackPharm D, BCPS Clinical Pharmacist Pager [phone] 11/07/17 1023

## 2017-11-07 ENCOUNTER — Other Ambulatory Visit: Payer: Self-pay

## 2017-11-07 ENCOUNTER — Encounter (HOSPITAL_COMMUNITY): Payer: Self-pay

## 2017-11-07 DIAGNOSIS — Z79899 Other long term (current) drug therapy: Secondary | ICD-10-CM | POA: Diagnosis not present

## 2017-11-07 DIAGNOSIS — E876 Hypokalemia: Secondary | ICD-10-CM

## 2017-11-07 DIAGNOSIS — N183 Chronic kidney disease, stage 3 (moderate): Secondary | ICD-10-CM

## 2017-11-07 DIAGNOSIS — I480 Paroxysmal atrial fibrillation: Secondary | ICD-10-CM | POA: Diagnosis present

## 2017-11-07 DIAGNOSIS — Z66 Do not resuscitate: Secondary | ICD-10-CM | POA: Diagnosis present

## 2017-11-07 DIAGNOSIS — E7800 Pure hypercholesterolemia, unspecified: Secondary | ICD-10-CM | POA: Diagnosis present

## 2017-11-07 DIAGNOSIS — F0151 Vascular dementia with behavioral disturbance: Secondary | ICD-10-CM | POA: Diagnosis present

## 2017-11-07 DIAGNOSIS — G9341 Metabolic encephalopathy: Secondary | ICD-10-CM | POA: Diagnosis present

## 2017-11-07 DIAGNOSIS — Z95810 Presence of automatic (implantable) cardiac defibrillator: Secondary | ICD-10-CM

## 2017-11-07 DIAGNOSIS — Z955 Presence of coronary angioplasty implant and graft: Secondary | ICD-10-CM | POA: Diagnosis not present

## 2017-11-07 DIAGNOSIS — Z87891 Personal history of nicotine dependence: Secondary | ICD-10-CM | POA: Diagnosis not present

## 2017-11-07 DIAGNOSIS — E038 Other specified hypothyroidism: Secondary | ICD-10-CM | POA: Diagnosis not present

## 2017-11-07 DIAGNOSIS — N390 Urinary tract infection, site not specified: Principal | ICD-10-CM

## 2017-11-07 DIAGNOSIS — I130 Hypertensive heart and chronic kidney disease with heart failure and stage 1 through stage 4 chronic kidney disease, or unspecified chronic kidney disease: Secondary | ICD-10-CM | POA: Diagnosis present

## 2017-11-07 DIAGNOSIS — F0390 Unspecified dementia without behavioral disturbance: Secondary | ICD-10-CM | POA: Diagnosis not present

## 2017-11-07 DIAGNOSIS — I255 Ischemic cardiomyopathy: Secondary | ICD-10-CM | POA: Diagnosis present

## 2017-11-07 DIAGNOSIS — B964 Proteus (mirabilis) (morganii) as the cause of diseases classified elsewhere: Secondary | ICD-10-CM | POA: Diagnosis present

## 2017-11-07 DIAGNOSIS — I509 Heart failure, unspecified: Secondary | ICD-10-CM | POA: Diagnosis present

## 2017-11-07 DIAGNOSIS — I2510 Atherosclerotic heart disease of native coronary artery without angina pectoris: Secondary | ICD-10-CM | POA: Diagnosis present

## 2017-11-07 DIAGNOSIS — R451 Restlessness and agitation: Secondary | ICD-10-CM | POA: Diagnosis present

## 2017-11-07 DIAGNOSIS — Z7189 Other specified counseling: Secondary | ICD-10-CM | POA: Diagnosis not present

## 2017-11-07 DIAGNOSIS — E039 Hypothyroidism, unspecified: Secondary | ICD-10-CM | POA: Diagnosis present

## 2017-11-07 DIAGNOSIS — Z96653 Presence of artificial knee joint, bilateral: Secondary | ICD-10-CM | POA: Diagnosis present

## 2017-11-07 DIAGNOSIS — I252 Old myocardial infarction: Secondary | ICD-10-CM | POA: Diagnosis not present

## 2017-11-07 DIAGNOSIS — Z515 Encounter for palliative care: Secondary | ICD-10-CM | POA: Diagnosis present

## 2017-11-07 DIAGNOSIS — G9340 Encephalopathy, unspecified: Secondary | ICD-10-CM | POA: Diagnosis not present

## 2017-11-07 DIAGNOSIS — F0391 Unspecified dementia with behavioral disturbance: Secondary | ICD-10-CM | POA: Diagnosis not present

## 2017-11-07 DIAGNOSIS — J449 Chronic obstructive pulmonary disease, unspecified: Secondary | ICD-10-CM | POA: Diagnosis present

## 2017-11-07 DIAGNOSIS — Z7982 Long term (current) use of aspirin: Secondary | ICD-10-CM | POA: Diagnosis not present

## 2017-11-07 DIAGNOSIS — R296 Repeated falls: Secondary | ICD-10-CM | POA: Diagnosis present

## 2017-11-07 LAB — BASIC METABOLIC PANEL
ANION GAP: 14 (ref 5–15)
BUN: 21 mg/dL — ABNORMAL HIGH (ref 6–20)
CALCIUM: 9.1 mg/dL (ref 8.9–10.3)
CO2: 20 mmol/L — ABNORMAL LOW (ref 22–32)
Chloride: 108 mmol/L (ref 101–111)
Creatinine, Ser: 0.93 mg/dL (ref 0.44–1.00)
GFR calc Af Amer: >60 mL/min (ref >60)
GFR, EST NON AFRICAN AMERICAN: 55 mL/min — AB (ref >60)
GLUCOSE: 73 mg/dL (ref 65–99)
Potassium: 3.7 mmol/L (ref 3.5–5.1)
Sodium: 142 mmol/L (ref 135–145)

## 2017-11-07 LAB — CBC
HCT: 32.6 % — ABNORMAL LOW (ref 36.0–46.0)
HEMOGLOBIN: 9.7 g/dL — AB (ref 12.0–15.0)
MCH: 22.4 pg — ABNORMAL LOW (ref 26.0–34.0)
MCHC: 29.8 g/dL — AB (ref 30.0–36.0)
MCV: 75.3 fL — ABNORMAL LOW (ref 78.0–100.0)
Platelets: 235 10*3/uL (ref 150–400)
RBC: 4.33 MIL/uL (ref 3.87–5.11)
RDW: 17.4 % — AB (ref 11.5–15.5)
WBC: 8.7 10*3/uL (ref 4.0–10.5)

## 2017-11-07 LAB — MAGNESIUM: Magnesium: 1.7 mg/dL (ref 1.7–2.4)

## 2017-11-07 LAB — CBG MONITORING, ED
Glucose-Capillary: 70 mg/dL (ref 65–99)
Glucose-Capillary: 67 mg/dL (ref 65–99)

## 2017-11-07 LAB — MRSA PCR SCREENING: MRSA by PCR: NEGATIVE

## 2017-11-07 MED ORDER — MAGNESIUM SULFATE 2 GM/50ML IV SOLN
2.0000 g | Freq: Once | INTRAVENOUS | Status: AC
  Administered 2017-11-07: 2 g via INTRAVENOUS
  Filled 2017-11-07: qty 50

## 2017-11-07 MED ORDER — SODIUM CHLORIDE 0.9 % IV SOLN
1.0000 g | INTRAVENOUS | Status: DC
  Administered 2017-11-07 – 2017-11-10 (×4): 1 g via INTRAVENOUS
  Filled 2017-11-07: qty 1
  Filled 2017-11-07 (×2): qty 10
  Filled 2017-11-07: qty 1
  Filled 2017-11-07: qty 10

## 2017-11-07 MED ORDER — ORAL CARE MOUTH RINSE
15.0000 mL | Freq: Two times a day (BID) | OROMUCOSAL | Status: DC
  Administered 2017-11-07 – 2017-11-11 (×7): 15 mL via OROMUCOSAL

## 2017-11-07 MED ORDER — LORAZEPAM 2 MG/ML IJ SOLN
1.0000 mg | Freq: Once | INTRAMUSCULAR | Status: AC
  Administered 2017-11-07: 1 mg via INTRAVENOUS
  Filled 2017-11-07: qty 1

## 2017-11-07 MED ORDER — DEXTROSE 5 % IV SOLN
INTRAVENOUS | Status: DC
  Administered 2017-11-07 – 2017-11-08 (×2): via INTRAVENOUS

## 2017-11-07 NOTE — Evaluation (Signed)
Clinical/Bedside Swallow Evaluation Patient Details  Name: [name] MRN: [id number] Date of Birth: 02-25-1931  Today's Date: 11/07/2017 Time: SLP Start Time (ACUTE ONLY): 1607 SLP Stop Time (ACUTE ONLY): 1643 SLP Time Calculation (min) (ACUTE ONLY): 36 min  Past Medical History:  Past Medical History:  Diagnosis Date  . Aphasia   . Automatic implantable cardiac defibrillator in situ    Medtronic  . Avitaminosis D   . B12 deficiency   . Cellulitis   . CHF (congestive heart failure) (HCC)   . COPD (chronic obstructive pulmonary disease) (HCC)   . Coronary atherosclerosis of native coronary artery    Stent to OM3, DES D1 and RCA 3/12  . Depression   . Essential hypertension, benign   . Exertional dyspnea   . Hypercholesterolemia   . Hyperlipidemia   . Hypothyroidism   . Ischemic cardiomyopathy    LVEF 35-40%  . Myocardial infarction [facility](HCC)    May 2006, March 2012  . Neuropathy   . Osteopenia   . Renal disorder   . Spinal stenosis    Chronic back pain  . Stroke (HCC)   . Ventricular fibrillation [facility](HCC)    Past Surgical History:  Past Surgical History:  Procedure Laterality Date  . ABDOMINAL HYSTERECTOMY  1971  . APPENDECTOMY  1956  . CARDIAC DEFIBRILLATOR PLACEMENT     Medtronic  . CATARACT EXTRACTION W/PHACO Left 10/04/2013   Procedure: CATARACT EXTRACTION PHACO AND INTRAOCULAR LENS PLACEMENT (IOC);  Surgeon: Gemma PayorKerry Hunt, MD;  Location: [facility];  Service: Ophthalmology;  Laterality: Left;  CDE 9.37  . CATARACT EXTRACTION W/PHACO Right 10/25/2013   Procedure: CATARACT EXTRACTION PHACO AND INTRAOCULAR LENS PLACEMENT (IOC);  Surgeon: Gemma PayorKerry Hunt, MD;  Location: [facility];  Service: Ophthalmology;  Laterality: Right;  CDE 8.57  . IMPLANTABLE CARDIOVERTER DEFIBRILLATOR (ICD) GENERATOR CHANGE N/A 04/22/2014   Procedure: ICD GENERATOR CHANGE;  Surgeon: Marinus MawGregg W Taylor, MD;  [facility];  Service: Cardiovascular;  Laterality: N/A;  . JOINT REPLACEMENT    . REPLACEMENT  TOTAL KNEE Bilateral    Right-April 2006, Left-April 2011  . TONSILLECTOMY  1952   HPI:  Marie Cobbleatty S Abbe AmsterdamHopkins is a 84 y.o. female with medical history significant for ischemic cardiomyopathy with AICD, paroxysmal atrial fibrillation, CKD stage III, hypothyroidism, multi-infarct dementia with agitation, and physical deconditioning with high risk of falls who was brought to the emergency department for worsening agitation and behavioral disturbances coupled with frequent falls.  Brother and sister at the bedside are primary historians who states that patient has been essentially having worsening behavioral disturbances over the last several weeks and has been more somnolent as well.  They state that she feels as though she is steadily declining and brought her to the ED today on account of agitations that cannot be controlled at her group home.  Patient is currently somnolent and calm, but cannot give any adequate history.   Assessment / Plan / Recommendation Clinical Impression  Pt was provided a clinical swallowing evaluation at bedside. Pt was in a state of somnolence upon SLP entering the room but with moderate verbal cues and repositioning was roused to alertness for evaluation. Pt's sister was present to provide history reporting she has dentures and eats regular foods when she has them in. SLP provided oral care and note dry lingual surface. Pt demonstrated poor awareness of bolus with ice chips and thin liquids and suspect delayed swallow trigger; note mild wet vocal quality and immediate coughing on 2/10 trials. Pt  consumed NTL without overt s/sx of aspiration. Question if coordination of swallowing is negatively impacted by mentation. Pt consumed puree textures with no overt s/sx of aspiration. Note consistent belching after all swallows of liquids; question esophageal dysphagia component and recommend consider GI consult. Recommend initiate D1/puree diet and NECTAR thick liquids. Recommend 1:1 feeder  for meals and QID oral care. ST will continue to follow while in acute setting for potential diet upgrade and education of compensatory strategies. SLP Visit Diagnosis: Dysphagia, unspecified (R13.10)    Aspiration Risk  Mild aspiration risk;Moderate aspiration risk    Diet Recommendation Dysphagia 1 (Puree);Nectar-thick liquid   Liquid Administration via: Cup Medication Administration: Crushed with puree Supervision: Full supervision/cueing for compensatory strategies Compensations: Minimize environmental distractions;Slow rate;Small sips/bites;Follow solids with liquid Postural Changes: Seated upright at 90 degrees;Remain upright for at least 30 minutes after po intake    Other  Recommendations Recommended Consults: Consider GI evaluation Oral Care Recommendations: Oral care QID Other Recommendations: Order thickener from pharmacy   Follow up Recommendations Skilled Nursing facility;24 hour supervision/assistance      Frequency and Duration min 1 x/week  1 week       Prognosis Prognosis for Safe Diet Advancement: Fair Barriers to Reach Goals: Cognitive deficits;Language deficits      Swallow Study   General Date of Onset: 11/06/17 HPI: [name] is a 84 y.o. female with medical history significant for ischemic cardiomyopathy with AICD, paroxysmal atrial fibrillation, CKD stage III, hypothyroidism, multi-infarct dementia with agitation, and physical deconditioning with high risk of falls who was brought to the emergency department for worsening agitation and behavioral disturbances coupled with frequent falls.  Brother and sister at the bedside are primary historians who states that patient has been essentially having worsening behavioral disturbances over the last several weeks and has been more somnolent as well.  They state that she feels as though she is steadily declining and brought her to the ED today on account of agitations that cannot be controlled at her group home.   Patient is currently somnolent and calm, but cannot give any adequate history. Type of Study: Bedside Swallow Evaluation Previous Swallow Assessment: 2016 Diet Prior to this Study: NPO Temperature Spikes Noted: No Respiratory Status: Room air History of Recent Intubation: No Behavior/Cognition: Cooperative;Pleasant mood;Lethargic/Drowsy Oral Cavity Assessment: Dry Oral Care Completed by SLP: Yes Oral Cavity - Dentition: Dentures, not available Vision: Functional for self-feeding Self-Feeding Abilities: Needs assist;Total assist Patient Positioning: Upright in bed Baseline Vocal Quality: Normal Volitional Cough: Cognitively unable to elicit Volitional Swallow: Able to elicit    Oral/Motor/Sensory Function     Ice Chips Ice chips: Within functional limits   Thin Liquid Thin Liquid: Impaired Presentation: Cup;Straw Oral Phase Impairments: Poor awareness of bolus Oral Phase Functional Implications: Prolonged oral transit;Oral holding Pharyngeal  Phase Impairments: Suspected delayed Swallow;Wet Vocal Quality;Cough - Immediate;Cough - Delayed    Nectar Thick Nectar Thick Liquid: Within functional limits   Honey Thick Honey Thick Liquid: Not tested   Puree Puree: Within functional limits   Solid   [name], CCC-SLP Speech Language Pathologist    Solid: Not tested        Georgetta Haber 11/07/2017,5:02 PM

## 2017-11-07 NOTE — ED Notes (Signed)
PT in to eval and treat.

## 2017-11-07 NOTE — Evaluation (Signed)
Physical Therapy Evaluation Patient Details Name: [name] MRN: [id number] DOB: [birth year] Today's Date: 11/07/2017   History of Present Illness  [name] is a 84 y/o female with worsening agitation and behavioral distturbances coupled with frequent falls, PMHx: ischeemic cardiomyopathy with AICD A-fib, CKD stage III, hypothyroidism, multi-infarct dementia with agitation    Clinical Impression  Patient presents confused and requires much encouragement and Max verbal/tactile cueing to participate.  Patient limited to sitting up at bedside due to weakness, fatigue, agitation and fear of falling.  Patient tends to fall backwards while seated and unable to attempt sit to stands or transfers due to poor safety.  Patient will benefit from continued physical therapy in hospital and recommended venue below to increase strength, balance, endurance for safe ADLs and gait.    Follow Up Recommendations SNF;Supervision/Assistance - 24 hour    Equipment Recommendations  None recommended by PT    Recommendations for Other Services       Precautions / Restrictions Precautions Precautions: Fall Restrictions Weight Bearing Restrictions: No      Mobility  Bed Mobility Overal bed mobility: Needs Assistance Bed Mobility: Supine to Sit;Sit to Supine     Supine to sit: Max assist Sit to supine: Max assist      Transfers                    Ambulation/Gait                Stairs            Wheelchair Mobility    Modified Rankin (Stroke Patients Only)       Balance Overall balance assessment: Needs assistance Sitting-balance support: Bilateral upper extremity supported;Feet unsupported Sitting balance-Leahy Scale: Poor Sitting balance - Comments: frequent falling backwards, resisting movement                                     Pertinent Vitals/Pain Pain Assessment: Faces Faces Pain Scale: No hurt    Home Living Family/patient  expects to be discharged to:: Group home                 Additional Comments: patient is poor historian    Prior Function Level of Independence: Needs assistance   Gait / Transfers Assistance Needed: patient is poor historian  ADL's / Homemaking Assistance Needed: assisted by group home staff        Hand Dominance        Extremity/Trunk Assessment   Upper Extremity Assessment Upper Extremity Assessment: Generalized weakness    Lower Extremity Assessment Lower Extremity Assessment: Generalized weakness    Cervical / Trunk Assessment Cervical / Trunk Assessment: Normal  Communication   Communication: Other (comment)(patient is poor historian, dementia)  Cognition Arousal/Alertness: Awake/alert Behavior During Therapy: Restless;Agitated;Anxious Overall Cognitive Status: No family/caregiver present to determine baseline cognitive functioning                                        General Comments      Exercises     Assessment/Plan    PT Assessment Patient needs continued PT services  PT Problem List         PT Treatment Interventions Gait training;Functional mobility training;Therapeutic activities;Therapeutic exercise;Patient/family education    PT Goals (Current goals can be found in  the Care Plan section)  Acute Rehab PT Goals Patient Stated Goal: none stated PT Goal Formulation: With patient Time For Goal Achievement: 11/21/17 Potential to Achieve Goals: Fair    Frequency Min 3X/week   Barriers to discharge        Co-evaluation               AM-PAC PT "6 Clicks" Daily Activity  Outcome Measure Difficulty turning over in bed (including adjusting bedclothes, sheets and blankets)?: A Lot Difficulty moving from lying on back to sitting on the side of the bed? : A Lot Difficulty sitting down on and standing up from a chair with arms (e.g., wheelchair, bedside commode, etc,.)?: Unable Help needed moving to and from a bed  to chair (including a wheelchair)?: Total Help needed walking in hospital room?: Total Help needed climbing 3-5 steps with a railing? : Total 6 Click Score: 8    End of Session   Activity Tolerance: Patient limited by fatigue;Treatment limited secondary to agitation Patient left: in bed;Other (comment) Nurse Communication: Mobility status;Other (comment) PT Visit Diagnosis: Unsteadiness on feet (R26.81);Other abnormalities of gait and mobility (R26.89);History of falling (Z91.81)    Time: 1610-9604 PT Time Calculation (min) (ACUTE ONLY): 29 min   Charges:   PT Evaluation $PT Eval Moderate Complexity: 1 Mod PT Treatments $Therapeutic Activity: 23-37 mins   PT G Codes:        2:22 PM, Nov 26, 2017 Ocie Bob, MPT Physical Therapist with [facility] [phone] office [phone] mobile phone

## 2017-11-07 NOTE — Progress Notes (Signed)
PROGRESS NOTE   [name]  ZOX:[id number]  DOB: Feb 21, 1931  DOA: 11/06/2017 PCP: Benita StabileHall, John Z, MD  Brief Admission Hx: [name] is a 84 y.o. female with medical history significant for ischemic cardiomyopathy with AICD, paroxysmal atrial fibrillation, CKD stage III, hypothyroidism, multi-infarct dementia with agitation, and physical deconditioning with high risk of falls who was brought to the emergency department for worsening agitation and behavioral disturbances coupled with frequent falls.  Brother and sister at the bedside are primary historians who states that patient has been essentially having worsening behavioral disturbances over the last several weeks and has been more somnolent as well.  They state that she feels as though she is steadily declining and brought her to the ED today on account of agitations that cannot be controlled at her group home.  MDM/Assessment & Plan:   1. Acute metabolic encephalopathy likely secondary to UTI.  Check urine cultures and place on IV Rocephin empirically for treatment.  Patient has had prior Proteus infection that was sensitive to Rocephin.  Maintain on neurochecks with sitter at bedside due to behavioral disturbances and agitation.  Consider TTS consultation if patient does not improve.  Maintain on Seroquel 25mg  bid as previously and provide Haldol as needed.  She has had TSH checked in January as well as ammonia and B12 which were normal.  RPR was also noted to be nonreactive at that time.  N.p.o. except meds for now with swallow screen. 2. Progressive multi-infarct dementia with episodes of acute delirium.  Attention to palliative care for goals of care discussion with family members.  Prognosis is poor overall. 3. Hypokalemia.  Potassium supplementation IV. Check Mg in am. 4. Paroxysmal atrial fibrillation.  Telemetry monitoring and continue on metoprolol as well as dual antiplatelet therapy.  She is not a candidate for  anticoagulation due to her frequent falls. 5. Ischemic cardiomyopathy with CAD.  Continue aspirin, Plavix, beta-blocker, and statin. 6. Hypothyroidism.  Continue Synthroid. 7. CKD stage III.  Renal function stable. 8. Physical deconditioning with frequent falls.  Fall precautions and PT evaluation due to change in condition.  She likely needs SNF placement.   DVT prophylaxis: Lovenox Code Status: DNR Family Communication: Brother and sister at the bedside Disposition Plan: ongoing inpatient medical treatment needed for UTI Consults called:Palliative Care   Subjective: Pt awake, but confused and screaming out at times.  Denies pain.  Cooperative.   Objective: Vitals:   11/07/17 0602 11/07/17 0700 11/07/17 0839 11/07/17 1009  BP: (!) 163/86 (!) 156/73 (!) 150/91 (!) 150/87  Pulse: 71 69 71 72  Resp: 14 16 20 16   Temp:      TempSrc:      SpO2: 100% 100% (!) 71% 97%  Weight:      Height:        Intake/Output Summary (Last 24 hours) at 11/07/2017 1228 Last data filed at 11/07/2017 0358 Gross per 24 hour  Intake 300 ml  Output -  Net 300 ml   Filed Weights   11/06/17 2000  Weight: 60.3 kg (133 lb)     REVIEW OF SYSTEMS  As per history otherwise all reviewed and reported negative  Exam:  General exam: elderly confused, demented female lying in bed. NAD.  Respiratory system:  No increased work of breathing. Cardiovascular system: S1 & S2 heard.  No JVD, murmurs, gallops, clicks or pedal edema. Gastrointestinal system: Abdomen is nondistended, soft and nontender. Normal bowel sounds heard. Central nervous system: Alert and oriented. No focal  neurological deficits. Extremities: no CCE.  Data Reviewed: Basic Metabolic Panel: Recent Labs  Lab 11/06/17 2007 11/07/17 0555  NA 141 142  K 3.2* 3.7  CL 108 108  CO2 22 20*  GLUCOSE 86 73  BUN 24* 21*  CREATININE 1.02* 0.93  CALCIUM 9.3 9.1  MG  --  1.7   Liver Function Tests: No results for input(s): AST, ALT,  ALKPHOS, BILITOT, PROT, ALBUMIN in the last 168 hours. No results for input(s): LIPASE, AMYLASE in the last 168 hours. No results for input(s): AMMONIA in the last 168 hours. CBC: Recent Labs  Lab 11/06/17 2007 11/07/17 0555  WBC 10.4 8.7  NEUTROABS 7.2  --   HGB 11.0* 9.7*  HCT 36.5 32.6*  MCV 74.8* 75.3*  PLT 258 235   Cardiac Enzymes: No results for input(s): CKTOTAL, CKMB, CKMBINDEX, TROPONINI in the last 168 hours. CBG (last 3)  Recent Labs    11/06/17 1934 11/07/17 0822 11/07/17 1205  GLUCAP 78 70 67   No results found for this or any previous visit (from the past 240 hour(s)).   Studies: Ct Head Wo Contrast  Result Date: 11/06/2017 CLINICAL DATA:  Altered level of consciousness. EXAM: CT HEAD WITHOUT CONTRAST TECHNIQUE: Contiguous axial images were obtained from the base of the skull through the vertex without intravenous contrast. COMPARISON:  CT scan of September 03, 2017. FINDINGS: Brain: Stable right occipital and left frontal encephalomalacia is noted consistent with old infarctions. Mild diffuse cortical atrophy is noted. Mild chronic ischemic white matter disease is noted. No mass effect or midline shift is noted. Ventricular size is within normal limits. There is no evidence of mass lesion, hemorrhage or acute infarction. Vascular: Atherosclerosis of intracranial internal carotid arteries is noted. Skull: Normal. Negative for fracture or focal lesion. Sinuses/Orbits: No acute finding. Other: None. IMPRESSION: Mild diffuse cortical atrophy. Mild chronic ischemic white matter disease. Stable right occipital and left frontal encephalomalacia. No acute intracranial abnormality seen. Electronically Signed   By: Lupita Raider, M.D.   On: 11/06/2017 20:19   Scheduled Meds: . enoxaparin (LOVENOX) injection  40 mg Subcutaneous Q24H  . levothyroxine  50 mcg Intravenous QAC breakfast  . metoprolol tartrate  2.5 mg Intravenous Q6H   Continuous Infusions: . cefTRIAXone  (ROCEPHIN)  IV      Principal Problem:   Acute metabolic encephalopathy Active Problems:   Automatic implantable cardioverter-defibrillator in situ   Hypothyroidism   Cerebrovascular accident, old   AF (paroxysmal atrial fibrillation) (HCC)   Hypokalemia   CKD (chronic kidney disease), stage III (HCC)   Dementia   Acute lower UTI   Encephalopathy acute  Time spent:   Standley Dakins, MD, FAAFP [facility] Pager [phone]  If 7PM-7AM, please contact night-coverage [email or web address] Password [email or web address] 11/07/2017, 12:28 PM    LOS: 0 days

## 2017-11-07 NOTE — Plan of Care (Signed)
  Problem: Acute Rehab PT Goals(only PT should resolve) Goal: Pt Will Go Supine/Side To Sit Outcome: Progressing Flowsheets (Taken 11/07/2017 1422) Pt will go Supine/Side to Sit: with moderate assist Goal: Patient Will Transfer Sit To/From Stand Outcome: Progressing Flowsheets (Taken 11/07/2017 1422) Patient will transfer sit to/from stand: with moderate assist Goal: Pt Will Transfer Bed To Chair/Chair To Bed Outcome: Progressing Flowsheets (Taken 11/07/2017 1422) Pt will Transfer Bed to Chair/Chair to Bed: with mod assist Goal: Pt Will Ambulate Outcome: Progressing Flowsheets (Taken 11/07/2017 1422) Pt will Ambulate: 15 feet;with moderate assist;with rolling walker  2:23 PM, 11/07/17 Ocie BobJames Laurita Peron, MPT Physical Therapist with [facility] 336 [phone] office [phone] mobile phone

## 2017-11-07 NOTE — Progress Notes (Signed)
Pharmacy Antibiotic Note  [name] is a 84 y.o. female admitted on 11/06/2017 with UTI.  Pharmacy has been consulted for ceftriaxone dosing.  Plan: Ceftriaxone 1gm IV given in ED, pt with keflex allergy (rash).  F/U cxs and clinical progress Monitor V/S and labs  Height: 5' (152.4 cm) Weight: 133 lb (60.3 kg) IBW/kg (Calculated) : 45.5  Temp (24hrs), Avg:97.1 F (36.2 C), Min:97.1 F (36.2 C), Max:97.1 F (36.2 C)  Recent Labs  Lab 11/06/17 2007 11/07/17 0555  WBC 10.4 8.7  CREATININE 1.02* 0.93    Estimated Creatinine Clearance: 36.5 mL/min (by C-G formula based on SCr of 0.93 mg/dL).    Allergies  Allergen Reactions  . Nsaids Other (See Comments)    Vomited blood   . Oxycodone Hcl Hives and Rash  . Povidone-Iodine Rash  . Prednisone Nausea Only and Other (See Comments)    FLU SYMPTOMS  . Amoxicillin-Pot Clavulanate Other (See Comments)    VOMITING  . Betadine [Povidone Iodine] Rash    rash  . Doxazosin Mesylate Other (See Comments)    INCONTINENCE   . Cephalexin Rash  . Cymbalta [Duloxetine Hcl] Nausea Only and Other (See Comments)    Headache  . Gabapentin Rash  . Neomycin Rash  . Tape Other (See Comments)    Redness    Antimicrobials this admission: Ceftriaxone 4/25 >>   Microbiology results: 4/25 Catherized UCx: pending  Thank you for allowing pharmacy to be a part of this patient's care.  Luan PullingGarrett [name], PharmD, MBA, BCGP Clinical Pharmacist  11/07/2017 7:17 AM

## 2017-11-07 NOTE — Progress Notes (Signed)
Palliative:  PMT to see patient Monday, 4/29.  MD aware. No charge

## 2017-11-08 LAB — BASIC METABOLIC PANEL
Anion gap: 11 (ref 5–15)
BUN: 15 mg/dL (ref 6–20)
CALCIUM: 8.7 mg/dL — AB (ref 8.9–10.3)
CHLORIDE: 104 mmol/L (ref 101–111)
CO2: 23 mmol/L (ref 22–32)
CREATININE: 0.79 mg/dL (ref 0.44–1.00)
Glucose, Bld: 101 mg/dL — ABNORMAL HIGH (ref 65–99)
Potassium: 3 mmol/L — ABNORMAL LOW (ref 3.5–5.1)
SODIUM: 138 mmol/L (ref 135–145)

## 2017-11-08 LAB — MAGNESIUM: MAGNESIUM: 1.9 mg/dL (ref 1.7–2.4)

## 2017-11-08 MED ORDER — SODIUM CHLORIDE 0.9 % IV SOLN
INTRAVENOUS | Status: AC
  Administered 2017-11-08: 13:00:00 via INTRAVENOUS

## 2017-11-08 MED ORDER — POTASSIUM CHLORIDE 10 MEQ/100ML IV SOLN
10.0000 meq | INTRAVENOUS | Status: AC
  Administered 2017-11-08 (×5): 10 meq via INTRAVENOUS
  Filled 2017-11-08 (×5): qty 100

## 2017-11-08 NOTE — Progress Notes (Signed)
PROGRESS NOTE   [name]  ZOX:[id number]  DOB: [birth year]  DOA: 11/06/2017 PCP: Benita Stabile, MD  Brief Admission Hx: [name] is a 84 y.o. female with medical history significant for ischemic cardiomyopathy with AICD, paroxysmal atrial fibrillation, CKD stage III, hypothyroidism, multi-infarct dementia with agitation, and physical deconditioning with high risk of falls who was brought to the emergency department for worsening agitation and behavioral disturbances coupled with frequent falls.  Brother and sister at the bedside are primary historians who states that patient has been essentially having worsening behavioral disturbances over the last several weeks and has been more somnolent as well.  They state that she feels as though she is steadily declining and brought her to the ED today on account of agitations that cannot be controlled at her group home.  MDM/Assessment & Plan:   1. Acute metabolic encephalopathy likely secondary to UTI.  Following urine cultures and place on IV Rocephin empirically for treatment.  Patient has had prior Proteus infection that was sensitive to Rocephin.  Maintain on neurochecks with sitter at bedside due to behavioral disturbances and agitation.  Consider TTS consultation if patient does not improve.  Maintain on Seroquel  bid as previously and provide Haldol as needed.  She has had TSH checked in January as well as ammonia and B12 which were normal.  RPR was also noted to be nonreactive at that time.  N.p.o. except meds for now with swallow screen. 2. Gram negative rod UTI - awaiting ID and C&S results.  Continue current antibiotics.   3. Progressive multi-infarct dementia with episodes of acute delirium.  Attention to palliative care for goals of care discussion with family members.  Prognosis is poor overall. 4. Hypokalemia.  Potassium supplementation IV. Check Mg in am. 5. Paroxysmal atrial fibrillation.  Telemetry monitoring and continue  on metoprolol as well as dual antiplatelet therapy.  She is not a candidate for anticoagulation due to her frequent falls. 6. Ischemic cardiomyopathy with CAD.  Continue aspirin, Plavix, beta-blocker, and statin. 7. Hypothyroidism.  Continue Synthroid. 8. CKD stage III.  Renal function stable. 9. Physical deconditioning with frequent falls.  Fall precautions and PT evaluation due to change in condition.  She needs SNF placement.   DVT prophylaxis: Lovenox Code Status: DNR Family Communication: Brother and sister at the bedside Disposition Plan: ongoing inpatient medical treatment needed for UTI Consults called:Palliative Care will see 4/29   Subjective: Pt remains confused.   Objective: Vitals:   11/07/17 2134 11/07/17 2200 11/07/17 2320 11/08/17 0601  BP: (!) 161/114  (!) 147/78 (!) 162/87  Pulse: 62  72 64  Resp:   16 18  Temp: 97.6 F (36.4 C)   97.7 F (36.5 C)  TempSrc: Oral   Oral  SpO2: (!) 85% 98% 99% 95%  Weight:      Height:        Intake/Output Summary (Last 24 hours) at 11/08/2017 1259 Last data filed at 11/08/2017 1000 Gross per 24 hour  Intake 1391.67 ml  Output -  Net 1391.67 ml   Filed Weights   11/06/17 2000  Weight: 60.3 kg (133 lb)     REVIEW OF SYSTEMS  As per history otherwise all reviewed and reported negative  Exam:  General exam: elderly confused, demented female lying in bed. NAD.  Respiratory system:  No increased work of breathing. Cardiovascular system: S1 & S2 heard.  No JVD, murmurs, gallops, clicks or pedal edema. Gastrointestinal system: Abdomen is nondistended, soft and  nontender. Normal bowel sounds heard. Central nervous system: Alert and oriented. No focal neurological deficits. Extremities: no CCE.  Data Reviewed: Basic Metabolic Panel: Recent Labs  Lab 11/06/17 2007 11/07/17 0555 11/08/17 0544  NA 141 142 138  K 3.2* 3.7 3.0*  CL 108 108 104  CO2 22 20* 23  GLUCOSE 86 73 101*  BUN 24* 21* 15  CREATININE 1.02*  0.93 0.79  CALCIUM 9.3 9.1 8.7*  MG  --  1.7 1.9   Liver Function Tests: No results for input(s): AST, ALT, ALKPHOS, BILITOT, PROT, ALBUMIN in the last 168 hours. No results for input(s): LIPASE, AMYLASE in the last 168 hours. No results for input(s): AMMONIA in the last 168 hours. CBC: Recent Labs  Lab 11/06/17 2007 11/07/17 0555  WBC 10.4 8.7  NEUTROABS 7.2  --   HGB 11.0* 9.7*  HCT 36.5 32.6*  MCV 74.8* 75.3*  PLT 258 235   Cardiac Enzymes: No results for input(s): CKTOTAL, CKMB, CKMBINDEX, TROPONINI in the last 168 hours. CBG (last 3)  Recent Labs    11/06/17 1934 11/07/17 0822 11/07/17 1205  GLUCAP 78 70 67   Recent Results (from the past 240 hour(s))  Urine culture     Status: Abnormal (Preliminary result)   Collection Time: 11/06/17  8:58 PM  Result Value Ref Range Status   Specimen Description   Final    URINE, CLEAN CATCH Performed at [facility], [address], [place], [place] [address]    Special Requests   Final    NONE Performed at [facility], [address] [place], [place] [address]    Culture >=100,000 COLONIES/mL GRAM NEGATIVE RODS (A)  Final   Report Status PENDING  Incomplete  MRSA PCR Screening     Status: None   Collection Time: 11/07/17  4:00 PM  Result Value Ref Range Status   MRSA by PCR NEGATIVE NEGATIVE Final    Comment:        The GeneXpert MRSA Assay (FDA approved for NASAL specimens only), is one component of a comprehensive MRSA colonization surveillance program. It is not intended to diagnose MRSA infection nor to guide or monitor treatment for MRSA infections. Performed at [facility], [address] [place], [place] [address]     Studies: Ct Head Wo Contrast  Result Date: 11/06/2017 CLINICAL DATA:  Altered level of consciousness. EXAM: CT HEAD WITHOUT CONTRAST TECHNIQUE: Contiguous axial images were obtained from the base of the skull through the vertex without intravenous contrast. COMPARISON:  CT scan of September 03, 2017. FINDINGS: Brain: Stable right occipital and left frontal encephalomalacia is noted consistent with old infarctions. Mild diffuse cortical atrophy is noted. Mild chronic ischemic white matter disease is noted. No mass effect or midline shift is noted. Ventricular size is within normal limits. There is no evidence of mass lesion, hemorrhage or acute infarction. Vascular: Atherosclerosis of intracranial internal carotid arteries is noted. Skull: Normal. Negative for fracture or focal lesion. Sinuses/Orbits: No acute finding. Other: None. IMPRESSION: Mild diffuse cortical atrophy. Mild chronic ischemic white matter disease. Stable right occipital and left frontal encephalomalacia. No acute intracranial abnormality seen. Electronically Signed   By: Lupita Raider, M.D.   On: 11/06/2017 20:19   Scheduled Meds: . enoxaparin (LOVENOX) injection  40 mg Subcutaneous Q24H  . levothyroxine  50 mcg Intravenous QAC breakfast  . mouth rinse  15 mL Mouth Rinse BID  . metoprolol tartrate  2.5 mg Intravenous Q6H   Continuous Infusions: . cefTRIAXone (  ROCEPHIN)  IV Stopped (11/07/17 2157)  . dextrose 50 mL/hr at 11/08/17 1029    Principal Problem:   Acute metabolic encephalopathy Active Problems:   Automatic implantable cardioverter-defibrillator in situ   Hypothyroidism   Cerebrovascular accident, old   AF (paroxysmal atrial fibrillation) (HCC)   Hypokalemia   Toxic Acute encephalopathy due to UTI   CKD (chronic kidney disease), stage III (HCC)   Dementia   Acute lower UTI   Encephalopathy acute  Time spent:   Standley Dakins, MD, FAAFP [facility] Pager [phone]  If 7PM-7AM, please contact night-coverage [email or web address] Password [email or web address] 11/08/2017, 12:59 PM    LOS: 1 day

## 2017-11-09 LAB — BASIC METABOLIC PANEL
Anion gap: 13 (ref 5–15)
BUN: 11 mg/dL (ref 6–20)
CALCIUM: 9.1 mg/dL (ref 8.9–10.3)
CO2: 21 mmol/L — ABNORMAL LOW (ref 22–32)
CREATININE: 0.85 mg/dL (ref 0.44–1.00)
Chloride: 105 mmol/L (ref 101–111)
GFR calc Af Amer: >60 mL/min (ref >60)
GFR calc non Af Amer: >60 mL/min (ref >60)
Glucose, Bld: 93 mg/dL (ref 65–99)
Potassium: 3.6 mmol/L (ref 3.5–5.1)
SODIUM: 139 mmol/L (ref 135–145)

## 2017-11-09 LAB — MAGNESIUM: MAGNESIUM: 1.7 mg/dL (ref 1.7–2.4)

## 2017-11-09 LAB — URINE CULTURE

## 2017-11-09 LAB — GLUCOSE, CAPILLARY
GLUCOSE-CAPILLARY: 90 mg/dL (ref 65–99)
Glucose-Capillary: 99 mg/dL (ref 65–99)

## 2017-11-09 MED ORDER — ACETAMINOPHEN 160 MG/5ML PO SOLN
500.0000 mg | Freq: Once | ORAL | Status: DC
  Filled 2017-11-09: qty 20.3

## 2017-11-09 MED ORDER — ACETAMINOPHEN 160 MG/5ML PO SOLN
500.0000 mg | Freq: Four times a day (QID) | ORAL | Status: DC | PRN
  Administered 2017-11-10: 500 mg via ORAL
  Filled 2017-11-09: qty 20.3

## 2017-11-09 MED ORDER — SALINE SPRAY 0.65 % NA SOLN
1.0000 | Freq: Three times a day (TID) | NASAL | Status: DC
  Administered 2017-11-09 – 2017-11-11 (×6): 1 via NASAL
  Filled 2017-11-09: qty 44

## 2017-11-09 MED ORDER — HYDRALAZINE HCL 20 MG/ML IJ SOLN
10.0000 mg | INTRAMUSCULAR | Status: DC | PRN
  Administered 2017-11-09 – 2017-11-10 (×2): 10 mg via INTRAVENOUS
  Filled 2017-11-09 (×2): qty 1

## 2017-11-09 NOTE — Progress Notes (Signed)
PROGRESS NOTE   [name]  KGM:[id number]  DOB: 1931-06-27  DOA: 11/06/2017 PCP: Benita Stabile, MD  Brief Admission Hx: [name] is a 84 y.o. female with medical history significant for ischemic cardiomyopathy with AICD, paroxysmal atrial fibrillation, CKD stage III, hypothyroidism, multi-infarct dementia with agitation, and physical deconditioning with high risk of falls who was brought to the emergency department for worsening agitation and behavioral disturbances coupled with frequent falls.  Brother and sister at the bedside are primary historians who states that patient has been essentially having worsening behavioral disturbances over the last several weeks and has been more somnolent as well.  They state that she feels as though she is steadily declining and brought her to the ED today on account of agitations that cannot be controlled at her group home.  MDM/Assessment & Plan:   1. Acute metabolic encephalopathy likely secondary to Proteus UTI.  IV Rocephin empirically for treatment.  Patient has had prior Proteus infection that was sensitive to Rocephin.  Maintain on neurochecks with sitter at bedside due to behavioral disturbances and agitation.  Consider TTS consultation if patient does not improve.  Maintain on Seroquel  bid as previously and provide Haldol as needed.  She has had TSH checked in January as well as ammonia and B12 which were normal.  RPR was also noted to be nonreactive at that time.  N.p.o. except meds for now with swallow screen. 2. Proteus UTI -  Continue current antibiotics.   3. Progressive multi-infarct dementia with episodes of acute delirium.  Attention to palliative care for goals of care discussion with family members.  Prognosis is poor overall.  Family hopeful to take patient to SNF near [place].  4. Hypokalemia.  Potassium supplementation IV. Check Mg in am. 5. Paroxysmal atrial fibrillation.  Telemetry monitoring and continue on metoprolol  as well as dual antiplatelet therapy.  She is not a candidate for anticoagulation due to her frequent falls. 6. Ischemic cardiomyopathy with CAD.  Continue aspirin, Plavix, beta-blocker, and statin. 7. Hypothyroidism.  Continue Synthroid. 8. CKD stage III.  Renal function stable. 9. Physical deconditioning with frequent falls.  Fall precautions and PT evaluation due to change in condition.  She needs SNF placement.   10. Ear pain / neck pain - add humidification to oxygen, saline nasal spray, Kpad to neck as needed.   DVT prophylaxis: Lovenox Code Status: DNR Family Communication: daughter at the bedside Disposition Plan: ongoing inpatient medical treatment needed for UTI Consults called:Palliative Care will see 4/29 Daughter hopeful to take to [facility] with hospice services   Subjective: Pt remains confused. Daughter at bedside, say that patient complained of left ear pain.   Objective: Vitals:   11/08/17 2358 11/09/17 0302 11/09/17 0304 11/09/17 0505  BP: (!) 177/94 (!) 163/104 (!) 180/100 111/70  Pulse: 88 (!) 101 86 83  Resp: 18 (!) 21  20  Temp:    98.5 F (36.9 C)  TempSrc:    Oral  SpO2: 98% 94% 91% 96%  Weight:      Height:        Intake/Output Summary (Last 24 hours) at 11/09/2017 1226 Last data filed at 11/09/2017 1000 Gross per 24 hour  Intake 1564.76 ml  Output 500 ml  Net 1064.76 ml   Filed Weights   11/06/17 2000  Weight: 60.3 kg (133 lb)     REVIEW OF SYSTEMS  As per history otherwise all reviewed and reported negative  Exam:  General exam: elderly  confused, demented female lying in bed. NAD. ENT: bilateral TMs clear and pearly gray, nasal mucosa dried out with yellow crusting seen.   Respiratory system:  No increased work of breathing. Cardiovascular system: S1 & S2 heard.  No JVD, murmurs, gallops, clicks or pedal edema. Gastrointestinal system: Abdomen is nondistended, soft and nontender. Normal bowel sounds heard. Central nervous system:  Alert and oriented. No focal neurological deficits. Extremities: no CCE.  Data Reviewed: Basic Metabolic Panel: Recent Labs  Lab 11/06/17 2007 11/07/17 0555 11/08/17 0544 11/09/17 0633  NA 141 142 138 139  K 3.2* 3.7 3.0* 3.6  CL 108 108 104 105  CO2 22 20* 23 21*  GLUCOSE 86 73 101* 93  BUN 24* 21* 15 11  CREATININE 1.02* 0.93 0.79 0.85  CALCIUM 9.3 9.1 8.7* 9.1  MG  --  1.7 1.9 1.7   Liver Function Tests: No results for input(s): AST, ALT, ALKPHOS, BILITOT, PROT, ALBUMIN in the last 168 hours. No results for input(s): LIPASE, AMYLASE in the last 168 hours. No results for input(s): AMMONIA in the last 168 hours. CBC: Recent Labs  Lab 11/06/17 2007 11/07/17 0555  WBC 10.4 8.7  NEUTROABS 7.2  --   HGB 11.0* 9.7*  HCT 36.5 32.6*  MCV 74.8* 75.3*  PLT 258 235   Cardiac Enzymes: No results for input(s): CKTOTAL, CKMB, CKMBINDEX, TROPONINI in the last 168 hours. CBG (last 3)  Recent Labs    11/07/17 0822 11/07/17 1205 11/09/17 0744  GLUCAP 70 67 90   Recent Results (from the past 240 hour(s))  Urine culture     Status: Abnormal   Collection Time: 11/06/17  8:58 PM  Result Value Ref Range Status   Specimen Description   Final    URINE, CLEAN CATCH Performed at [facility], [address], [place], [place] [address]    Special Requests   Final    NONE Performed at [facility], [address] [place], [place] [address]    Culture >=100,000 COLONIES/mL PROTEUS MIRABILIS (A)  Final   Report Status 11/09/2017 FINAL  Final   Organism ID, Bacteria PROTEUS MIRABILIS (A)  Final      Susceptibility   Proteus mirabilis - MIC*    AMPICILLIN <=2 SENSITIVE Sensitive     CEFAZOLIN <=4 SENSITIVE Sensitive     CEFTRIAXONE <=1 SENSITIVE Sensitive     CIPROFLOXACIN >=4 RESISTANT Resistant     GENTAMICIN 8 INTERMEDIATE Intermediate     IMIPENEM 1 SENSITIVE Sensitive     NITROFURANTOIN >=512 RESISTANT Resistant     TRIMETH/SULFA >=320 RESISTANT Resistant      AMPICILLIN/SULBACTAM <=2 SENSITIVE Sensitive     PIP/TAZO <=4 SENSITIVE Sensitive     * >=100,000 COLONIES/mL PROTEUS MIRABILIS  MRSA PCR Screening     Status: None   Collection Time: 11/07/17  4:00 PM  Result Value Ref Range Status   MRSA by PCR NEGATIVE NEGATIVE Final    Comment:        The GeneXpert MRSA Assay (FDA approved for NASAL specimens only), is one component of a comprehensive MRSA colonization surveillance program. It is not intended to diagnose MRSA infection nor to guide or monitor treatment for MRSA infections. Performed at [facility], [address], [place], [place] [address]     Studies: No results found. Scheduled Meds: . acetaminophen (TYLENOL) oral liquid 160 mg/5 mL  500 mg Oral Once  . enoxaparin (LOVENOX) injection  40 mg Subcutaneous Q24H  . levothyroxine  50 mcg Intravenous  QAC breakfast  . mouth rinse  15 mL Mouth Rinse BID  . metoprolol tartrate  2.5 mg Intravenous Q6H   Continuous Infusions: . sodium chloride 35 mL/hr at 11/08/17 1319  . cefTRIAXone (ROCEPHIN)  IV Stopped (11/08/17 2120)    Principal Problem:   Acute metabolic encephalopathy Active Problems:   Automatic implantable cardioverter-defibrillator in situ   Hypothyroidism   Cerebrovascular accident, old   AF (paroxysmal atrial fibrillation) (HCC)   Hypokalemia   Toxic Acute encephalopathy due to UTI   CKD (chronic kidney disease), stage III (HCC)   Dementia   Acute lower UTI   Encephalopathy acute  Time spent:   Standley Dakins, MD, FAAFP [facility] Pager [phone]  If 7PM-7AM, please contact night-coverage [email or web address] Password [facility] 11/09/2017, 12:26 PM    LOS: 2 days

## 2017-11-10 ENCOUNTER — Encounter (HOSPITAL_COMMUNITY): Payer: Self-pay | Admitting: Primary Care

## 2017-11-10 DIAGNOSIS — N390 Urinary tract infection, site not specified: Secondary | ICD-10-CM

## 2017-11-10 DIAGNOSIS — Z515 Encounter for palliative care: Secondary | ICD-10-CM

## 2017-11-10 DIAGNOSIS — Z7189 Other specified counseling: Secondary | ICD-10-CM

## 2017-11-10 DIAGNOSIS — F0391 Unspecified dementia with behavioral disturbance: Secondary | ICD-10-CM

## 2017-11-10 LAB — GLUCOSE, CAPILLARY
GLUCOSE-CAPILLARY: 104 mg/dL — AB (ref 65–99)
Glucose-Capillary: 93 mg/dL (ref 65–99)

## 2017-11-10 NOTE — Progress Notes (Signed)
  Speech Language Pathology Treatment: Dysphagia  Patient Details Name: [name] MRN: [id number] DOB: 01/13/1931 Today's Date: 11/10/2017 Time: 7829-5621 SLP Time Calculation (min) (ACUTE ONLY): 16 min  Assessment / Plan / Recommendation Clinical Impression  Pt initially sleeping soundly upon SLP arrival and during conversation with her daughter, however she did awaken and eat a little of her lunch meal with feeding assist from daughter. Her daughter reports that Pt has been minimally interactive and has only take a few bites/sips "here and there". Her daughter shares that they have spoken with [name], Palliative NP, and may pursue hospice. [name] lives in [place] so they are also considering bringing her there. Pt demonstrated good oral control with NTL via teaspoon and expressed feeling content. SLP shared that NTL likely provided improved oral control, but that if Pt desired thin water, liberalizing liquids should be considered pending goals of care. Daughter voices understanding. Will plan to continue diet as ordered and appropriate for now (D1/NTL) and follow per goals of care. Above reviewed with nursing. SLP also encouraged [name] to use toothbrush to tongue after meal (provided).    HPI HPI: [name] is a 84 y.o. female with medical history significant for ischemic cardiomyopathy with AICD, paroxysmal atrial fibrillation, CKD stage III, hypothyroidism, multi-infarct dementia with agitation, and physical deconditioning with high risk of falls who was brought to the emergency department for worsening agitation and behavioral disturbances coupled with frequent falls.  Brother and sister at the bedside are primary historians who states that patient has been essentially having worsening behavioral disturbances over the last several weeks and has been more somnolent as well.  They state that she feels as though she is steadily declining and brought her to the ED today  on account of agitations that cannot be controlled at her group home.  Patient is currently somnolent and calm, but cannot give any adequate history.      SLP Plan  Continue with current plan of care       Recommendations  Diet recommendations: Dysphagia 1 (puree);Nectar-thick liquid Liquids provided via: Teaspoon Medication Administration: Crushed with puree Supervision: Staff to assist with self feeding;Full supervision/cueing for compensatory strategies Compensations: Minimize environmental distractions;Slow rate;Small sips/bites;Follow solids with liquid Postural Changes and/or Swallow Maneuvers: Seated upright 90 degrees;Upright 30-60 min after meal                Oral Care Recommendations: Oral care BID;Staff/trained caregiver to provide oral care Follow up Recommendations: 24 hour supervision/assistance(Pt may decide on comfort care) SLP Visit Diagnosis: Dysphagia, unspecified (R13.10) Plan: Continue with current plan of care       Thank you,  [name], CCC-SLP [phone]                 Navpreet Szczygiel 11/10/2017, 1:13 PM

## 2017-11-10 NOTE — Progress Notes (Signed)
Called [employer] to turn off pt's ICD. [employer] rep, Jana Half, states she is planning on seeing pt tomorrow morning 11/11/17.

## 2017-11-10 NOTE — Consult Note (Signed)
Consultation Note Date: 11/10/2017   Patient Name: [name]  DOB: 1931-03-31  MRN: [id number]  Age / Sex: 84 y.o., female  PCP: Benita Stabile, MD Referring Physician: Cleora Fleet, MD  Reason for Consultation: Establishing goals of care, Inpatient hospice referral and Psychosocial/spiritual support  HPI/Patient Profile: 84 y.o. female  with past medical history of left MCA stroke in June 2016 leading to aphasia, second stroke on the right afterwards, multi-infarct dementia with agitation, A. fib, ischemic cardiomyopathy with AICD, stage III kidney disease, physical deconditioning, COPD, high blood pressure and cholesterol, spinal stenosis with chronic back pain, depression, Proteus UTI February 2019, resident of ALF admitted on 11/06/2017 with acute metabolic encephalopathy likely related to Proteus UTI.   Clinical Assessment and Goals of Care: [name] is resting quietly in bed.  She does not open her eyes or interact with me in any meaningful way when I call her name or touch her.  She appears relatively comfortable at this point.  Present today at bedside is daughter/HC POA, Terrial Rhodes.  We talked about [name] chronic and acute health history.  [name] shares that her mother has had a decline over the last 6 months, in particular over the last 3 months.   We talked about disposition options.  Dorene Sorrow states that her goal initially had been for her mother to come to SNF near she and her brother.  [name] shares that her mother has not been eating or drinking, minimally interacting over the last few days.  We talked about the benefits of residential hospice in detail.  We also talked about how to make choices for loved ones including 1) keeping them at the center of decision-making, 2) are we doing something for them or to them, can we change what is happening (we cannot change her aphasia, her  dysphagia, dementia), 3) what would the person [name] was 10 years ago say about her condition now.  At this point, [name] starts to cry.  She states that her mother would not want to live like this, and she sees no benefit in continuing further treatments.  We talked about the concept of "let nature take its course".  I give an example of when this would be appropriate.  I shared that it is not illegal, or unethical, but we know some people have a moral problem with not doing everything.  We talked about residential hospice, and how we would continue to care for [name] in residential hospice.  I shared that hospice would make sure her skin was clean and dry, that she had pleasure feedings, medications for comfort and dignity, family could stay around the clock.  We also talked about what is not given at residential hospice including IV fluids, IV antibiotics, she would be able to take regular by mouth medicines if she so desired/were able.  Daughter Lesle Chris states that she is agreeable to stop antibiotics, focus on comfort and dignity only when accepted to residential hospice.  She would like a  referral for residential hospice at [facility]   PPS 6 mo ago: 50 PPS 3 mo ago: 40 PPS 1 mo ago:30 PPS now: 78  Healthcare power of attorney HCPOA -daughter Lesle Chris and son Elverta Dimiceli.  Paperwork on file in epic.   SUMMARY OF RECOMMENDATIONS   Family is requesting comfort and dignity at end of life, [place] residential hospice referral. Let nature take its course.  Code Status/Advance Care Planning:  DNR  Symptom Management:   Per hospitalist, no additional needs at this time.  Per hospice protocol at residential hospice.  Palliative Prophylaxis:   Aspiration, Oral Care and Turn Reposition  Additional Recommendations (Limitations, Scope, Preferences):  Full Comfort Care and If accepted to residential hospice, full comfort care.  Psycho-social/Spiritual:     Desire for further Chaplaincy support:no  Additional Recommendations: Caregiving  Support/Resources and Education on Hospice  Prognosis:   < 4 weeks, would not be surprising based on decreased functional status over the last 3 to 6 months, frailty, aspiration, poor nutritional intake, recent Proteus UTI February of this year, complicated by normal albumin at 3.7.   Discharge Planning: Family is requesting residential hospice at [facility] for comfort and dignity at end of life.      Primary Diagnoses: Present on Admission: . Automatic implantable cardioverter-defibrillator in situ . AF (paroxysmal atrial fibrillation) (HCC) . CKD (chronic kidney disease), stage III (HCC) . Hypothyroidism . Hypokalemia . Encephalopathy acute . Toxic Acute encephalopathy due to UTI   I have reviewed the medical record, interviewed the patient and family, and examined the patient. The following aspects are pertinent.  Past Medical History:  Diagnosis Date  . Aphasia   . Automatic implantable cardiac defibrillator in situ    [employer]  . Avitaminosis D   . B12 deficiency   . Cellulitis   . CHF (congestive heart failure) (HCC)   . COPD (chronic obstructive pulmonary disease) (HCC)   . Coronary atherosclerosis of native coronary artery    Stent to OM3, DES D1 and RCA 3/12  . Depression   . Essential hypertension, benign   . Exertional dyspnea   . Hypercholesterolemia   . Hyperlipidemia   . Hypothyroidism   . Ischemic cardiomyopathy    LVEF 35-40%  . Myocardial infarction [facility])    May 2006, March 2012  . Neuropathy   . Osteopenia   . Renal disorder   . Spinal stenosis    Chronic back pain  . Stroke (HCC)   . Ventricular fibrillation [facility])    Social History   Socioeconomic History  . Marital status: Widowed    Spouse name: Not on file  . Number of children: Not on file  . Years of education: Not on file  . Highest education level: Not on file  Occupational History  .  Occupation: RETIRED    Employer: RETIRED  Social Needs  . Financial resource strain: Not on file  . Food insecurity:    Worry: Not on file    Inability: Not on file  . Transportation needs:    Medical: Not on file    Non-medical: Not on file  Tobacco Use  . Smoking status: Former Smoker    Packs/day: 0.50    Years: 25.00    Pack years: 12.50    Types: Cigarettes    Last attempt to quit: 09/13/2010    Years since quitting: 7.1  . Smokeless tobacco: Never Used  Substance and Sexual Activity  . Alcohol use: No  Alcohol/week: 0.0 oz  . Drug use: No  . Sexual activity: Yes    Birth control/protection: Post-menopausal  Lifestyle  . Physical activity:    Days per week: Not on file    Minutes per session: Not on file  . Stress: Not on file  Relationships  . Social connections:    Talks on phone: Not on file    Gets together: Not on file    Attends religious service: Not on file    Active member of club or organization: Not on file    Attends meetings of clubs or organizations: Not on file    Relationship status: Not on file  Other Topics Concern  . Not on file  Social History Narrative  . Not on file   Family History  Problem Relation Age of Onset  . Stroke Father   . Stroke Mother   . Heart attack Sister   . Heart attack Brother   . Stroke Sister    Scheduled Meds: . acetaminophen (TYLENOL) oral liquid 160 mg/5 mL  500 mg Oral Once  . enoxaparin (LOVENOX) injection  40 mg Subcutaneous Q24H  . levothyroxine  50 mcg Intravenous QAC breakfast  . mouth rinse  15 mL Mouth Rinse BID  . metoprolol tartrate  2.5 mg Intravenous Q6H  . sodium chloride  1 spray Each Nare TID   Continuous Infusions: . cefTRIAXone (ROCEPHIN)  IV Stopped (11/09/17 2025)   PRN Meds:.acetaminophen (TYLENOL) oral liquid 160 mg/5 mL, haloperidol lactate, hydrALAZINE, nitroGLYCERIN, ondansetron **OR** ondansetron (ZOFRAN) IV Medications Prior to Admission:  Prior to Admission medications     Medication Sig Start Date End Date Taking? Authorizing Provider  acetaminophen (TYLENOL) 650 MG CR tablet Take 650 mg by mouth every 8 (eight) hours as needed for pain.    Yes [provider]  aspirin 81 MG chewable tablet Chew 81 mg by mouth daily.   Yes [provider]  atorvastatin (LIPITOR) 40 MG tablet Take 40 mg by mouth at bedtime.    Yes [provider]  Cholecalciferol (VITAMIN D) 2000 UNITS CAPS Take 2,000 Units by mouth daily.   Yes [provider]  clopidogrel (PLAVIX) 75 MG tablet Take 75 mg by mouth at bedtime.  03/14/15  Yes [provider]  diclofenac sodium (VOLTAREN) 1 % GEL Apply 2 g topically 4 (four) times daily as needed.   Yes [provider]  docusate sodium (COLACE) 100 MG capsule Take 100 mg by mouth daily.   Yes [provider]  levothyroxine (SYNTHROID) 100 MCG tablet Take 100 mcg by mouth daily before breakfast.  03/17/15 10/03/18 Yes [provider]  metoprolol tartrate (LOPRESSOR) 25 MG tablet Take 25 mg by mouth 2 (two) times daily.   Yes [provider]  misoprostol (CYTOTEC) 100 MCG tablet Take 100 mcg by mouth 2 (two) times daily.   Yes [provider]  nitroGLYCERIN (NITROSTAT) 0.4 MG SL tablet Place 0.4 mg under the tongue every 5 (five) minutes as needed for chest pain.   Yes [provider]  polyethylene glycol (MIRALAX / GLYCOLAX) packet Take 17 g by mouth as needed.   Yes [provider]  potassium chloride (K-DUR,KLOR-CON) 10 MEQ tablet Take 10 mEq by mouth daily.  03/16/15  Yes [provider]  sertraline (ZOLOFT) 100 MG tablet Take 100 mg by mouth at bedtime.    Yes [provider]  vitamin B-12 (CYANOCOBALAMIN) 1000 MCG tablet Take 1,000 mcg by mouth daily.  Yes [provider]   Allergies  Allergen Reactions  . Nsaids Other (See Comments)    Vomited blood   . Oxycodone Hcl Hives and Rash  . Povidone-Iodine Rash  .  Prednisone Nausea Only and Other (See Comments)    FLU SYMPTOMS  . Amoxicillin-Pot Clavulanate Other (See Comments)    VOMITING  . Betadine [Povidone Iodine] Rash    rash  . Doxazosin Mesylate Other (See Comments)    INCONTINENCE   . Cephalexin Rash  . Cymbalta [Duloxetine Hcl] Nausea Only and Other (See Comments)    Headache  . Gabapentin Rash  . Neomycin Rash  . Tape Other (See Comments)    Redness   Review of Systems  Unable to perform ROS: Dementia    Physical Exam  Vital Signs: BP (!) 145/72 (BP Location: Right Arm)   Pulse 71   Temp 97.6 F (36.4 C) (Oral)   Resp 18   Ht 5' (1.524 m)   Wt 60.3 kg (133 lb)   SpO2 100%   BMI 25.97 kg/m  Pain Scale: PAINAD   Pain Score: 0-No pain   SpO2: SpO2: 100 % O2 Device:SpO2: 100 % O2 Flow Rate: .O2 Flow Rate (L/min): 2 L/min  IO: Intake/output summary:   Intake/Output Summary (Last 24 hours) at 11/10/2017 1316 Last data filed at 11/10/2017 1610 Gross per 24 hour  Intake 395 ml  Output -  Net 395 ml    LBM: Last BM Date: (unable to recall or verbalize last BM) Baseline Weight: Weight: 60.3 kg (133 lb) Most recent weight: Weight: 60.3 kg (133 lb)     Palliative Assessment/Data:   Flowsheet Rows     Most Recent Value  Intake Tab  Referral Department  Hospitalist  Unit at Time of Referral  Med/Surg Unit  Date Notified  11/07/17  Palliative Care Type  New Palliative care  Reason for referral  Clarify Goals of Care  Date of Admission  11/06/17  Date first seen by Palliative Care  11/07/17  # of days Palliative referral response time  0 Day(s)  # of days IP prior to Palliative referral  1  Clinical Assessment  Palliative Performance Scale Score  30%  Pain Max last 24 hours  Not able to report  Pain Min Last 24 hours  Not able to report  Dyspnea Max Last 24 Hours  Not able to report  Dyspnea Min Last 24 hours  Not able to report  Psychosocial & Spiritual Assessment  Palliative Care Outcomes  Patient/Family  meeting held?  No  Patient/Family wishes: Interventions discontinued/not started   Mechanical Ventilation      Time In: 1120 Time Out: 1230 Time Total: 70 minutes Greater than 50%  of this time was spent counseling and coordinating care related to the above assessment and plan.  Signed by: Katheran Awe, NP   Please contact Palliative Medicine Team phone at [phone] for questions and concerns.  For individual provider: See Loretha Stapler

## 2017-11-10 NOTE — Care Management Important Message (Signed)
Important Message  Patient Details  Name: [name] MRN: [id number] Date of Birth: 12/05/1930   Medicare Important Message Given:  Yes    Renie Ora 11/10/2017, 12:03 PM

## 2017-11-10 NOTE — Progress Notes (Signed)
PROGRESS NOTE   [name]  JYN:[id number]  DOB: [birth year]  DOA: 11/06/2017 PCP: Benita Stabile, MD  Brief Admission Hx: [name] is a 84 y.o. female with medical history significant for ischemic cardiomyopathy with AICD, paroxysmal atrial fibrillation, CKD stage III, hypothyroidism, multi-infarct dementia with agitation, and physical deconditioning with high risk of falls who was brought to the emergency department for worsening agitation and behavioral disturbances coupled with frequent falls.  Brother and sister at the bedside are primary historians who states that patient has been essentially having worsening behavioral disturbances over the last several weeks and has been more somnolent as well.  They state that she feels as though she is steadily declining and brought her to the ED today on account of agitations that cannot be controlled at her group home.  MDM/Assessment & Plan:   1. Acute metabolic encephalopathy likely secondary to Proteus UTI.  IV Rocephin empirically for treatment.  Patient has had prior Proteus infection that was sensitive to Rocephin.  Maintain on neurochecks with sitter at bedside due to behavioral disturbances and agitation. Consider TTS consultation if patient does not improve.  Maintain on Seroquel  bid as previously and provide Haldol as needed.  She has had TSH checked in January as well as ammonia and B12 which were normal.  RPR was also noted to be nonreactive at that time. Proteus UTI -  Continue current antibiotics.   2. Progressive multi-infarct dementia with episodes of acute delirium.  Attention to palliative care for goals of care discussion with family members.  Prognosis is poor overall.  Family hopeful to take patient to SNF near [place].  3. Hypokalemia.  Potassium supplementation IV given and repleted.  4. Paroxysmal atrial fibrillation.  Telemetry monitoring completed, continue on metoprolol as well as dual antiplatelet therapy.  She  is not a candidate for anticoagulation due to her frequent falls. 5. Ischemic cardiomyopathy with CAD.  Continue aspirin, Plavix, beta-blocker, and statin. 6. Hypothyroidism.  Continue Synthroid. 7. CKD stage III.  Renal function stable. 8. Physical deconditioning with frequent falls.  Fall precautions and PT evaluation due to change in condition.  She needs SNF placement.   9. Ear pain / neck pain - added humidification to oxygen, saline nasal spray, Kpad to neck as needed.   DVT prophylaxis: Lovenox Code Status: DNR Family Communication: daughter at the bedside Disposition Plan: ongoing inpatient medical treatment needed for UTI Consults called:Palliative Care will see 4/29 Daughter hopeful to take to [facility] with hospice services   Subjective: Pt remains confused but answers questions at times, says she is in hospital. She appears to be in no distress, she is sleeping comfortably.    Objective: Vitals:   11/09/17 2306 11/09/17 2307 11/10/17 0449 11/10/17 0844  BP: (!) 76/52 132/88 (!) 156/103 (!) 145/72  Pulse: 90 (!) 105 63 71  Resp: 18  18   Temp:  98.7 F (37.1 C) 97.6 F (36.4 C)   TempSrc:  Oral Oral   SpO2: 93%  (!) 83% 100%  Weight:      Height:        Intake/Output Summary (Last 24 hours) at 11/10/2017 1032 Last data filed at 11/10/2017 8657 Gross per 24 hour  Intake 455 ml  Output -  Net 455 ml   Filed Weights   11/06/17 2000  Weight: 60.3 kg (133 lb)   REVIEW OF SYSTEMS  As per history otherwise all reviewed and reported negative  Exam:  General exam: elderly  confused, demented female lying in bed. NAD. ENT: bilateral TMs clear and pearly gray, nasal mucosa dried out with yellow crusting seen.   Respiratory system:  No increased work of breathing. Cardiovascular system: S1 & S2 heard.  No JVD, murmurs, gallops, clicks or pedal edema. Gastrointestinal system: Abdomen is nondistended, soft and nontender. Normal bowel sounds heard. Central nervous  system: Alert and oriented. No focal neurological deficits. Extremities: no CCE.  Data Reviewed: Basic Metabolic Panel: Recent Labs  Lab 11/06/17 2007 11/07/17 0555 11/08/17 0544 11/09/17 0633  NA 141 142 138 139  K 3.2* 3.7 3.0* 3.6  CL 108 108 104 105  CO2 22 20* 23 21*  GLUCOSE 86 73 101* 93  BUN 24* 21* 15 11  CREATININE 1.02* 0.93 0.79 0.85  CALCIUM 9.3 9.1 8.7* 9.1  MG  --  1.7 1.9 1.7   Liver Function Tests: No results for input(s): AST, ALT, ALKPHOS, BILITOT, PROT, ALBUMIN in the last 168 hours. No results for input(s): LIPASE, AMYLASE in the last 168 hours. No results for input(s): AMMONIA in the last 168 hours. CBC: Recent Labs  Lab 11/06/17 2007 11/07/17 0555  WBC 10.4 8.7  NEUTROABS 7.2  --   HGB 11.0* 9.7*  HCT 36.5 32.6*  MCV 74.8* 75.3*  PLT 258 235   Cardiac Enzymes: No results for input(s): CKTOTAL, CKMB, CKMBINDEX, TROPONINI in the last 168 hours. CBG (last 3)  Recent Labs    11/09/17 0744 11/09/17 2116 11/10/17 0734  GLUCAP 90 99 93   Recent Results (from the past 240 hour(s))  Urine culture     Status: Abnormal   Collection Time: 11/06/17  8:58 PM  Result Value Ref Range Status   Specimen Description   Final    URINE, CLEAN CATCH Performed at [facility], [address] [place], [place] [address]    Special Requests   Final    NONE Performed at [facility], [address], [place], [place] [address]    Culture >=100,000 COLONIES/mL PROTEUS MIRABILIS (A)  Final   Report Status 11/09/2017 FINAL  Final   Organism ID, Bacteria PROTEUS MIRABILIS (A)  Final      Susceptibility   Proteus mirabilis - MIC*    AMPICILLIN <=2 SENSITIVE Sensitive     CEFAZOLIN <=4 SENSITIVE Sensitive     CEFTRIAXONE <=1 SENSITIVE Sensitive     CIPROFLOXACIN >=4 RESISTANT Resistant     GENTAMICIN 8 INTERMEDIATE Intermediate     IMIPENEM 1 SENSITIVE Sensitive     NITROFURANTOIN >=512 RESISTANT Resistant     TRIMETH/SULFA >=320 RESISTANT Resistant      AMPICILLIN/SULBACTAM <=2 SENSITIVE Sensitive     PIP/TAZO <=4 SENSITIVE Sensitive     * >=100,000 COLONIES/mL PROTEUS MIRABILIS  MRSA PCR Screening     Status: None   Collection Time: 11/07/17  4:00 PM  Result Value Ref Range Status   MRSA by PCR NEGATIVE NEGATIVE Final    Comment:        The GeneXpert MRSA Assay (FDA approved for NASAL specimens only), is one component of a comprehensive MRSA colonization surveillance program. It is not intended to diagnose MRSA infection nor to guide or monitor treatment for MRSA infections. Performed at [facility], [address], [place], [place] [address]     Studies: No results found. Scheduled Meds: . acetaminophen (TYLENOL) oral liquid 160 mg/5 mL  500 mg Oral Once  . enoxaparin (LOVENOX) injection  40 mg Subcutaneous Q24H  . levothyroxine  50 mcg Intravenous  QAC breakfast  . mouth rinse  15 mL Mouth Rinse BID  . metoprolol tartrate  2.5 mg Intravenous Q6H  . sodium chloride  1 spray Each Nare TID   Continuous Infusions: . cefTRIAXone (ROCEPHIN)  IV Stopped (11/09/17 2025)    Principal Problem:   Acute metabolic encephalopathy Active Problems:   Automatic implantable cardioverter-defibrillator in situ   Hypothyroidism   Cerebrovascular accident, old   AF (paroxysmal atrial fibrillation) (HCC)   Hypokalemia   Toxic Acute encephalopathy due to UTI   CKD (chronic kidney disease), stage III (HCC)   Dementia   Acute lower UTI   Encephalopathy acute  Time spent:   Standley Dakins, MD, FAAFP [facility] Pager [phone]  If 7PM-7AM, please contact night-coverage [email or web address] Password [email or web address] 11/10/2017, 10:32 AM    LOS: 3 days

## 2017-11-10 NOTE — Clinical Social Work Note (Signed)
Ellie, Bryand #[id number] (CSN: [id number]) (84 y.o. F) (Adm: 11/06/17)  [id number]  PCP   Benita Stabile  Demographics  Comment    Last edited by  on at   Address: Home Phone: Work Phone: Mobile Phone:  [facility]  [address] [place]  [place] [address]   [phone] -- --  SSN: Insurance: Marital Status: Religion:  [id number] MEDICARE [employer]  Patient Ethnicity & Race   Ethnic Group Patient Race  Not Hispanic or Latino White or Caucasian  Documents Filed to Patient   Power of Attorney Living Will Clinical Unknown Study Attachment Consent Form ABN Waiver After Visit Summary Lab Result Scan Code Status MyChart Status Advance Care Planning  Not on File Not on File Not on File Not on File Filed Not on File Mosetta Pigeon DNR [Updated on 11/06/17 2211] Active Jump to the Activity  Admission Information   Attending Provider Admitting Provider Admission Type Admission Date/Time  Cleora Fleet, MD Cleora Fleet, MD Emergency 11/06/17 1835  Discharge Date Hospital Service Auth/Cert Status Service Area   Acute Care Incomplete [facility]  Unit Room/Bed Admission Status   AP-DEPT 300 A322/A322-01 Admission (Confirmed)   Hospital Account   Name Acct ID Class Status Primary Coverage  Ahna, [place] [id number] Inpatient Open MEDICARE - MEDICARE PART A AND B      Guarantor Account (for Hospital Account [id number])   Name Relation to Pt Service Area Active? Acct Type  York Spaniel S Self CHSA Yes Personal/Family  Address Phone    [facility] [address] [place], [place] [address] [phone])        Coverage Information (for Hospital Account [id number])   1. MEDICARE/MEDICARE PART A AND B   F/O Payor/Plan Precert #  MEDICARE/MEDICARE PART A AND B   Subscriber Subscriber #  Jaleeyah, Munce [id number]  Address Phone  [address] [place], [place] [address]   2. BLUE CROSS BLUE SHIELD/BCBS/FEDERAL EMP PPO    F/O Payor/Plan Precert #  BLUE CROSS BLUE SHIELD/BCBS/FEDERAL EMP PPO   Subscriber Subscriber #  Makhiya, [place] [id number]  Address Phone  [address] [place], [place] [address] [phone]

## 2017-11-10 NOTE — Clinical Social Work Note (Addendum)
Sent referral to [facility]. Spoke with Cassandra at hospice to update. Per Elonda Husky, they will evaluate, speak with pt's family, and notify LCSW when determination is made.  3:20 Received call from Cassandra at the [facility] stating that they would like for pt's defibrilator to be discontinued prior to her placement at the hospice home. They can accept her there tomorrow. Updated Ardis Rowan, who will update MD. Will follow up in AM.

## 2017-11-10 NOTE — Clinical Social Work Note (Addendum)
Clinical Social Work Assessment  Patient Details  Name: [name] MRN: [id number] Date of Birth: [birth year]  Date of referral:  11/10/17               Reason for consult:  Facility Placement, Discharge Planning                Permission sought to share information with:  Facility Industrial/product designer granted to share information::  Yes, Verbal Permission Granted  Name::        Agency::     Relationship::     Contact Information:     Housing/Transportation Living arrangements for the past 2 months:  Group Home Source of Information:  Adult Children, Medical Team Patient Interpreter Needed:  None Criminal Activity/Legal Involvement Pertinent to Current Situation/Hospitalization:  No - Comment as needed Significant Relationships:  Adult Children Lives with:  Facility Resident Do you feel safe going back to the place where you live?  No Need for family participation in patient care:  Yes (Comment)  Care giving concerns:  Pt had been in a family care home. She is needing higher level of care.    Social Worker assessment / plan: Pt is an 84 year old female referred to CSW for SNF placement. Pt had been residing at a family care home prior to this admission. Palliative Care APNP meeting with pt and daughter today. MD indicated daughter was hoping to move pt to a SNF near [place]. Will make referrals once further information is obtained about desired facility and STR vs. Comfort care.   Palliative Care APNP states family may be interested in [facility]. They would like a referral to see if pt is appropriate. Will follow and assist as needed.  Employment status:  Retired Health and safety inspector:  Medicare PT Recommendations:  Skilled Nursing Facility Information / Referral to community resources:     Patient/Family's Response to care: Pt and family accepting of care.  Patient/Family's Understanding of and Emotional Response to  Diagnosis, Current Treatment, and Prognosis:  Pt has dementia. [name] appears to have a good understanding of diagnosis and treatment recommendations. Emotional response being supported by APNP.   Emotional Assessment Appearance:  Appears stated age Attitude/Demeanor/Rapport:    Affect (typically observed):  Calm Orientation:  Oriented to Self Alcohol / Substance use:  Not Applicable Psych involvement (Current and /or in the community):  No (Comment)  Discharge Needs  Concerns to be addressed:  Discharge Planning Concerns Readmission within the last 30 days:  No Current discharge risk:  Physical Impairment Barriers to Discharge:  Other(referrals to SNF in [place])   Elliot Gault, LCSW 11/10/2017, 12:04 PM

## 2017-11-11 DIAGNOSIS — F0390 Unspecified dementia without behavioral disturbance: Secondary | ICD-10-CM

## 2017-11-11 DIAGNOSIS — E039 Hypothyroidism, unspecified: Secondary | ICD-10-CM

## 2017-11-11 LAB — GLUCOSE, CAPILLARY: Glucose-Capillary: 80 mg/dL (ref 65–99)

## 2017-11-11 NOTE — Progress Notes (Signed)
[employer] did not arrive to turn off pt's ICD before pt's discharge. [employer] representative called. [name] will go to [facility] to turn off ICD. Staff at hospice notified.

## 2017-11-11 NOTE — Discharge Summary (Signed)
Physician Discharge Summary  [name] LTJ:[id number] DOB: 1931/04/05 DOA: 11/06/2017  PCP: Celene Squibb, MD  Admit date: 11/06/2017 Discharge date: 11/11/2017   Disposition: RESIDENTIAL HOSPICE   Recommendations SYMPTOM MANAGEMENT PER HOSPICE PROTOCOL  Discharge Condition: HOSPICE   CODE STATUS: DNR    Brief Hospitalization Summary: Please see all hospital notes, images, labs for full details of the hospitalization.  HPI: [name] is a 84 y.o. female with medical history significant for ischemic cardiomyopathy with AICD, paroxysmal atrial fibrillation, CKD stage III, hypothyroidism, multi-infarct dementia with agitation, and physical deconditioning with high risk of falls who was brought to the emergency department for worsening agitation and behavioral disturbances coupled with frequent falls.  Brother and sister at the bedside are primary historians who states that patient has been essentially having worsening behavioral disturbances over the last several weeks and has been more somnolent as well.  They state that she feels as though she is steadily declining and brought her to the ED today on account of agitations that cannot be controlled at her group home.  Patient is currently somnolent and calm, but cannot give any adequate history.   ED Course: Vital signs initially demonstrated some hypertension which is now well controlled.  Laboratory data demonstrates potassium 3.2, hemoglobin 11, BUN 24, creatinine 1.02 which is currently at baseline.  CT of the head demonstrates diffuse atrophy with no other acute findings.  Urine analysis has findings of possible UTI.  She has been given some Macrobid and will be started on Rocephin.  Notably, patient has had prior Proteus UTI which led to encephalopathy and delirium and was discharged on 07/2017 after treatment.  Brief Admission Hx: Marie S Hopkinsis a 84 y.o.femalewith medical history significant forischemic cardiomyopathy with  AICD, paroxysmal atrial fibrillation, CKD stage III, hypothyroidism, multi-infarct dementia with agitation, and physical deconditioning with high risk of falls who was brought to the emergency department for worsening agitation and behavioral disturbances coupled with frequent falls. Brother and sister at the bedside are primary historians who states that patient has been essentially having worsening behavioral disturbances over the last several weeks and has been more somnolent as well. They state that she feels as though she is steadily declining and brought her to the ED today on account of agitations that cannot be controlled at her group home.  MDM/Assessment & Plan:   1. Acute metabolic encephalopathy likely secondary to Proteus UTI.  IV Rocephin empirically for treatment. Patient has had prior Proteus infection that was sensitive to Rocephin. Pt not having any meaningful improvement.  Family met with palliative medicine team and decided to pursue full comfort care.   Please see palliative medicine consult notes and documentation.   2. Progressive multi-infarct dementia with episodes of acute delirium.  3. Hypokalemia. Potassium supplementation IV given and repleted.  4. Paroxysmal atrial fibrillation. Telemetry monitoring completed. 5. Ischemic cardiomyopathy with CAD.  6. Hypothyroidism.  7. CKD stage III. Renal function stable. 8. Physical deconditioning with frequent falls.    DVT prophylaxis:Lovenox Code Status:DNR Family Communication:daughter at the bedside Disposition Plan: [facility] called: Palliative care  Discharge Diagnoses:  Principal Problem:   Acute metabolic encephalopathy Active Problems:   Automatic implantable cardioverter-defibrillator in situ   Hypothyroidism   Cerebrovascular accident, old   AF (paroxysmal atrial fibrillation) (HCC)   Hypokalemia   Toxic Acute encephalopathy due to UTI   CKD (chronic kidney  disease), stage III (HCC)   Dementia   Acute lower UTI  Encephalopathy acute   Urinary tract infection without hematuria   Palliative care by specialist   Goals of care, counseling/discussion   Encounter for hospice care discussion  Discharge Instructions: Discharge Instructions    Diet - low sodium heart healthy   Complete by:  As directed    Increase activity slowly   Complete by:  As directed      Allergies as of 11/11/2017      Reactions   Nsaids Other (See Comments)   Vomited blood   Oxycodone Hcl Hives, Rash   Povidone-iodine Rash   Prednisone Nausea Only, Other (See Comments)   FLU SYMPTOMS   Amoxicillin-pot Clavulanate Other (See Comments)   VOMITING   Betadine [povidone Iodine] Rash   rash   Doxazosin Mesylate Other (See Comments)   INCONTINENCE   Cephalexin Rash   Cymbalta [duloxetine Hcl] Nausea Only, Other (See Comments)   Headache   Gabapentin Rash   Neomycin Rash   Tape Other (See Comments)   Redness      Medication List    STOP taking these medications   acetaminophen 650 MG CR tablet Commonly known as:  TYLENOL   aspirin 81 MG chewable tablet   atorvastatin 40 MG tablet Commonly known as:  LIPITOR   clopidogrel 75 MG tablet Commonly known as:  PLAVIX   diclofenac sodium 1 % Gel Commonly known as:  VOLTAREN   docusate sodium 100 MG capsule Commonly known as:  COLACE   metoprolol tartrate 25 MG tablet Commonly known as:  LOPRESSOR   misoprostol 100 MCG tablet Commonly known as:  CYTOTEC   nitroGLYCERIN 0.4 MG SL tablet Commonly known as:  NITROSTAT   polyethylene glycol packet Commonly known as:  MIRALAX / GLYCOLAX   potassium chloride 10 MEQ tablet Commonly known as:  K-DUR,KLOR-CON   sertraline 100 MG tablet Commonly known as:  ZOLOFT   SYNTHROID 100 MCG tablet Generic drug:  levothyroxine   vitamin B-12 1000 MCG tablet Commonly known as:  CYANOCOBALAMIN   Vitamin D 2000 units Caps       Allergies  Allergen  Reactions  . Nsaids Other (See Comments)    Vomited blood   . Oxycodone Hcl Hives and Rash  . Povidone-Iodine Rash  . Prednisone Nausea Only and Other (See Comments)    FLU SYMPTOMS  . Amoxicillin-Pot Clavulanate Other (See Comments)    VOMITING  . Betadine [Povidone Iodine] Rash    rash  . Doxazosin Mesylate Other (See Comments)    INCONTINENCE   . Cephalexin Rash  . Cymbalta [Duloxetine Hcl] Nausea Only and Other (See Comments)    Headache  . Gabapentin Rash  . Neomycin Rash  . Tape Other (See Comments)    Redness   Allergies as of 11/11/2017      Reactions   Nsaids Other (See Comments)   Vomited blood   Oxycodone Hcl Hives, Rash   Povidone-iodine Rash   Prednisone Nausea Only, Other (See Comments)   FLU SYMPTOMS   Amoxicillin-pot Clavulanate Other (See Comments)   VOMITING   Betadine [povidone Iodine] Rash   rash   Doxazosin Mesylate Other (See Comments)   INCONTINENCE   Cephalexin Rash   Cymbalta [duloxetine Hcl] Nausea Only, Other (See Comments)   Headache   Gabapentin Rash   Neomycin Rash   Tape Other (See Comments)   Redness      Medication List    STOP taking these medications   acetaminophen 650 MG CR tablet Commonly known as:  TYLENOL   aspirin 81 MG chewable tablet   atorvastatin 40 MG tablet Commonly known as:  LIPITOR   clopidogrel 75 MG tablet Commonly known as:  PLAVIX   diclofenac sodium 1 % Gel Commonly known as:  VOLTAREN   docusate sodium 100 MG capsule Commonly known as:  COLACE   metoprolol tartrate 25 MG tablet Commonly known as:  LOPRESSOR   misoprostol 100 MCG tablet Commonly known as:  CYTOTEC   nitroGLYCERIN 0.4 MG SL tablet Commonly known as:  NITROSTAT   polyethylene glycol packet Commonly known as:  MIRALAX / GLYCOLAX   potassium chloride 10 MEQ tablet Commonly known as:  K-DUR,KLOR-CON   sertraline 100 MG tablet Commonly known as:  ZOLOFT   SYNTHROID 100 MCG tablet Generic drug:  levothyroxine    vitamin B-12 1000 MCG tablet Commonly known as:  CYANOCOBALAMIN   Vitamin D 2000 units Caps      Procedures/Studies: Ct Head Wo Contrast  Result Date: 11/06/2017 CLINICAL DATA:  Altered level of consciousness. EXAM: CT HEAD WITHOUT CONTRAST TECHNIQUE: Contiguous axial images were obtained from the base of the skull through the vertex without intravenous contrast. COMPARISON:  CT scan of September 03, 2017. FINDINGS: Brain: Stable right occipital and left frontal encephalomalacia is noted consistent with old infarctions. Mild diffuse cortical atrophy is noted. Mild chronic ischemic white matter disease is noted. No mass effect or midline shift is noted. Ventricular size is within normal limits. There is no evidence of mass lesion, hemorrhage or acute infarction. Vascular: Atherosclerosis of intracranial internal carotid arteries is noted. Skull: Normal. Negative for fracture or focal lesion. Sinuses/Orbits: No acute finding. Other: None. IMPRESSION: Mild diffuse cortical atrophy. Mild chronic ischemic white matter disease. Stable right occipital and left frontal encephalomalacia. No acute intracranial abnormality seen. Electronically Signed   By: Marijo Conception, M.D.   On: 11/06/2017 20:19      Subjective: Pt appears comfortable.  NAD.  Spoke with daughter at bedside.    Discharge Exam: Vitals:   11/11/17 0508 11/11/17 0620  BP: (!) 182/103   Pulse: 72 100  Resp:    Temp: 98.2 F (36.8 C)   SpO2: 98%    Vitals:   11/10/17 1359 11/10/17 2221 11/11/17 0508 11/11/17 0620  BP: (!) 150/86 (!) 183/106 (!) 182/103   Pulse: 80 77 72 100  Resp: 18     Temp: 98.2 F (36.8 C) 98.4 F (36.9 C) 98.2 F (36.8 C)   TempSrc: Oral Axillary Axillary   SpO2: 98% 97% 98%   Weight:      Height:       General: Pt appears comfortable.  Cardiovascular: normal S1/S2. Respiratory: CTA bilaterally, no wheezing, no rhonchi Abdominal: Soft, NT, ND, bowel sounds + Extremities: no cyanosis   The  results of significant diagnostics from this hospitalization (including imaging, microbiology, ancillary and laboratory) are listed below for reference.     Microbiology: Recent Results (from the past 240 hour(s))  Urine culture     Status: Abnormal   Collection Time: 11/06/17  8:58 PM  Result Value Ref Range Status   Specimen Description   Final    URINE, CLEAN CATCH Performed at [facility], [address], [place], [place] [address]    Special Requests   Final    NONE Performed at [facility], [address] [place], [place] [address]    Culture >=100,000 COLONIES/mL PROTEUS MIRABILIS (A)  Final   Report Status 11/09/2017 FINAL  Final   Organism  ID, Bacteria PROTEUS MIRABILIS (A)  Final      Susceptibility   Proteus mirabilis - MIC*    AMPICILLIN <=2 SENSITIVE Sensitive     CEFAZOLIN <=4 SENSITIVE Sensitive     CEFTRIAXONE <=1 SENSITIVE Sensitive     CIPROFLOXACIN >=4 RESISTANT Resistant     GENTAMICIN 8 INTERMEDIATE Intermediate     IMIPENEM 1 SENSITIVE Sensitive     NITROFURANTOIN >=512 RESISTANT Resistant     TRIMETH/SULFA >=320 RESISTANT Resistant     AMPICILLIN/SULBACTAM <=2 SENSITIVE Sensitive     PIP/TAZO <=4 SENSITIVE Sensitive     * >=100,000 COLONIES/mL PROTEUS MIRABILIS  MRSA PCR Screening     Status: None   Collection Time: 11/07/17  4:00 PM  Result Value Ref Range Status   MRSA by PCR NEGATIVE NEGATIVE Final    Comment:        The GeneXpert MRSA Assay (FDA approved for NASAL specimens only), is one component of a comprehensive MRSA colonization surveillance program. It is not intended to diagnose MRSA infection nor to guide or monitor treatment for MRSA infections. Performed at [facility], [address], [place], [place] [address]      Labs: BNP (last 3 results) No results for input(s): BNP in the last 8760 hours. Basic Metabolic Panel: Recent Labs  Lab 11/06/17 2007 11/07/17 0555 11/08/17 0544 11/09/17 0633  NA 141 142 138 139  K 3.2*  3.7 3.0* 3.6  CL 108 108 104 105  CO2 22 20* 23 21*  GLUCOSE 86 73 101* 93  BUN 24* 21* 15 11  CREATININE 1.02* 0.93 0.79 0.85  CALCIUM 9.3 9.1 8.7* 9.1  MG  --  1.7 1.9 1.7   Liver Function Tests: No results for input(s): AST, ALT, ALKPHOS, BILITOT, PROT, ALBUMIN in the last 168 hours. No results for input(s): LIPASE, AMYLASE in the last 168 hours. No results for input(s): AMMONIA in the last 168 hours. CBC: Recent Labs  Lab 11/06/17 2007 11/07/17 0555  WBC 10.4 8.7  NEUTROABS 7.2  --   HGB 11.0* 9.7*  HCT 36.5 32.6*  MCV 74.8* 75.3*  PLT 258 235   Cardiac Enzymes: No results for input(s): CKTOTAL, CKMB, CKMBINDEX, TROPONINI in the last 168 hours. BNP: Invalid input(s): POCBNP CBG: Recent Labs  Lab 11/08/17 0724 11/09/17 0744 11/09/17 2116 11/10/17 0734 11/11/17 0737  GLUCAP 104* 90 99 93 80   D-Dimer No results for input(s): DDIMER in the last 72 hours. Hgb A1c No results for input(s): HGBA1C in the last 72 hours. Lipid Profile No results for input(s): CHOL, HDL, LDLCALC, TRIG, CHOLHDL, LDLDIRECT in the last 72 hours. Thyroid function studies No results for input(s): TSH, T4TOTAL, T3FREE, THYROIDAB in the last 72 hours.  Invalid input(s): FREET3 Anemia work up No results for input(s): VITAMINB12, FOLATE, FERRITIN, TIBC, IRON, RETICCTPCT in the last 72 hours. Urinalysis    Component Value Date/Time   COLORURINE YELLOW 11/06/2017 1929   APPEARANCEUR CLOUDY (A) 11/06/2017 1929   LABSPEC 1.024 11/06/2017 1929   PHURINE 5.0 11/06/2017 1929   GLUCOSEU NEGATIVE 11/06/2017 1929   HGBUR MODERATE (A) 11/06/2017 1929   BILIRUBINUR NEGATIVE 11/06/2017 1929   KETONESUR 5 (A) 11/06/2017 1929   PROTEINUR 100 (A) 11/06/2017 1929   UROBILINOGEN 1.0 12/30/2014 1930   NITRITE NEGATIVE 11/06/2017 1929   LEUKOCYTESUR LARGE (A) 11/06/2017 1929   Sepsis Labs Invalid input(s): PROCALCITONIN,  WBC,  LACTICIDVEN Microbiology Recent Results (from the past 240 hour(s))   Urine culture  Status: Abnormal   Collection Time: 11/06/17  8:58 PM  Result Value Ref Range Status   Specimen Description   Final    URINE, CLEAN CATCH Performed at [facility], [address], [place], [place] [address]    Special Requests   Final    NONE Performed at [facility], [address] [place], [place] [address]    Culture >=100,000 COLONIES/mL PROTEUS MIRABILIS (A)  Final   Report Status 11/09/2017 FINAL  Final   Organism ID, Bacteria PROTEUS MIRABILIS (A)  Final      Susceptibility   Proteus mirabilis - MIC*    AMPICILLIN <=2 SENSITIVE Sensitive     CEFAZOLIN <=4 SENSITIVE Sensitive     CEFTRIAXONE <=1 SENSITIVE Sensitive     CIPROFLOXACIN >=4 RESISTANT Resistant     GENTAMICIN 8 INTERMEDIATE Intermediate     IMIPENEM 1 SENSITIVE Sensitive     NITROFURANTOIN >=512 RESISTANT Resistant     TRIMETH/SULFA >=320 RESISTANT Resistant     AMPICILLIN/SULBACTAM <=2 SENSITIVE Sensitive     PIP/TAZO <=4 SENSITIVE Sensitive     * >=100,000 COLONIES/mL PROTEUS MIRABILIS  MRSA PCR Screening     Status: None   Collection Time: 11/07/17  4:00 PM  Result Value Ref Range Status   MRSA by PCR NEGATIVE NEGATIVE Final    Comment:        The GeneXpert MRSA Assay (FDA approved for NASAL specimens only), is one component of a comprehensive MRSA colonization surveillance program. It is not intended to diagnose MRSA infection nor to guide or monitor treatment for MRSA infections. Performed at [facility], [address] [place],  [address]    Time coordinating discharge: [place]  SIGNED:  Irwin Brakeman, MD  [facility] 11/11/2017, 10:32 AM Pager [phone]  If 7PM-7AM, please contact night-coverage [email or web address] Password [email or web address]

## 2017-11-11 NOTE — Discharge Instructions (Signed)
About Your Loved One's Last Days How will my loved one be cared for?  Talk with your loved one about how he or she would like to be cared for in his or her last days. Care options may include:  Home care. Many people choose to die in their own home or in the home of a family member. This may require family members to take on the role of caregivers.  Hospice care. Hospice is a service that is designed to provide medical, spiritual, and psychological support to people who are terminally ill and their families. Hospice care can take place in a variety of settings, including at home.  Hospital care. Some people prefer the comfort of having nurses and doctors nearby at all times.  Nursing home care. Nursing homes have medical staff on duty at all times.  Spiritual care. A priest, minister, or spiritual leader can be included as a member of the caregiving team to provide guidance and counseling along the way.  You may choose to combine several care options. What changes will I see in my loved one? Your loved one will go through some changes when death is near. He or she may:  Sleep more.  Urinate less and have fewer bowel movements.  Have mucus in the mouth.  Feel colder and look bluer in color.  Be restless and behave unusually. For example, he or she may pull on the sheets, talk to people who are not in the room, or talk to people who have already died.  Have breathing changes. Breathing may not be as deep, or it may become deeper and sound like snoring. There may be as many as 30 seconds between breaths. Breathing changes may come and go.  How can I help? Here are some things that you can do to keep your loved one comfortable:  Keep a light on in your loved one's room. He or she may not be able to see well when awake.  Wait until your loved one wakes up to give any needed medicine.  Do not force your loved one to eat, drink, or take medicine.  If your loved one has thick mucus in  the throat, it may help to raise his or her head and shoulders on pillows.  Put bed pads under your loved one. Change them when needed to keep them clean and dry.  If your loved one talks to people who are not there, do not try to correct him or her.  Comfort your loved one if he or she seems scared.  Call a health care provider if your loved one expresses having a need to urinate and being unable to do so.  Keep your loved one's mouth moist for comfort. To do this you may: ? Offer chips of ice if he or she is still able to chew. ? Put petroleum jelly on his or her lips to keep them from being too dry. ? Gently wipe out his or her mouth with wet mouth sponges. You can get these sponges from your health care provider.  What happens at the time of death? At the time of death:  Breathing stops.  The heart stops beating.  The eyes may be partly open.  The mouth may drop open a little.  These are all natural occurrences. Feelings of grief or relief at this time are normal. Talk with your caregiving team about any questions or needs you have. This information is not intended to replace advice given to  you by your health care provider. Make sure you discuss any questions you have with your health care provider. Document Released: 06/19/2009 Document Revised: 04/10/2016 Document Reviewed: 06/08/2014 Elsevier Interactive Patient Education  09/22/2016 [employer]. Death and Dying When a person's health care team determines that a terminal illness can no longer be controlled, medical testing and treatment often stop. But the person's care continues. The care focuses on making the person comfortable. The person receives medicines and treatments to control pain and other symptoms, such as constipation, nausea, and shortness of breath. Some people remain at home during this time, while others enter a hospice or other facility. Either way, services are available to help individuals and their families  with the medical, psychological, and spiritual issues surrounding dying. A hospice team often provides such services. The time at the end of life is different for each person. Individuals and their families have unique needs for information and support. Family members often want to know how long their loved one is expected to live. This is a hard question to answer. Factors such as where the disease is located and whether the person has other illnesses can affect how long a person is expected to live. Questions and concerns about the end of life should be discussed with the health care team as they arise. When to ask for assistance When caring for your loved one at home, there may be times when you need assistance from your loved one's health care team. You can contact the health care team for help in any of the following situations:  Your loved one is in pain that is not relieved by the prescribed dose of pain medicine.  Your loved one shows discomfort, such as grimacing or moaning.  Your loved one is having trouble breathing and seems upset.  Your loved one is unable to urinate or empty the bowels.  Your loved one has fallen.  Your loved one is very depressed or talking about committing suicide.  You have difficulty giving medicine to your loved one.  You are overwhelmed by caring for your loved one or are too grieved or afraid to be with your loved one.  Any time you do not know how to handle a situation.  Providing care and comfort Managing pain  Contact the health care provider if the prescribed pain-relieving medicine dose does not seem to help. With the help of the health care team, you can also explore methods such as massage and relaxation techniques to help with pain.  Reposition your loved ones body from one side to the back to the other side every few hours to prevent bed sores. Try to minimize pressure under heels and elbows by placing a pillow or foam pads where  needed. Giving comfort  Keep your loved one as clean, dry, and comfortable as possible. Place disposable pads on the bed beneath the person and remove them when they become soiled.  Blankets can be used to warm your loved one. Although your loved one's skin may be cool, he or she is usually not aware of feeling cold. You should avoid warming your loved one with electric blankets or heating pads. These can cause burns.  Breathing may be easier if you turn your loved one's body to the side and place pillows beneath the head and behind the back. Although labored breathing can sound very distressing to you, gurgling and rattling sounds do not cause discomfort to your loved one. An external source of oxygen may benefit  some individuals. If your loved one is able to swallow, ice chips also may help. In addition, a cool mist humidifier may help make your loved one's breathing more comfortable. You may also use a fan to circulate the air.  Allow your loved one to choose if and when to eat or drink. Ice chips, water, or juice may be refreshing if the person can swallow. Keep the person's mouth and lips moist with products such as glycerin swabs and lip balm. Emotional support Everyone has different needs, but some emotions are common to most individuals who are dying. These include fear of abandonment and fear of being a burden. They also have concerns about loss of dignity and loss of control. Some ways you can provide comfort are as follows:  Keep your loved one company. Talk, watch movies, read, or just be with him or her.  Allow your loved one to express fears and concerns about dying, such as leaving family and friends behind. Be prepared to listen.  Be willing to reminisce about your loved one's life.  Avoid withholding difficult information. Most people prefer to be included in discussions about issues that concern them.  Reassure your loved one that you will honor advance directives, such as  living wills.  Ask if there is anything you can do.  Respect your loved one's need for privacy.  Communicating with a dying loved one  Plan visits and activities for times when your loved one is alert. It is important to speak directly to your loved one and talk as if he or she can hear, even if there is no response. Most people are still able to hear after they are no longer able to speak. Your loved one should not be shaken if he or she does not respond.  Gently remind your loved one of the time, date, and people who are present. If your loved one is agitated, do not attempt to restrain him or her. Be calm and reassuring. Speaking calmly may help to re-orient your loved one. What to expect when death is near Certain signs and symptoms can help you anticipate when death is near. They are described below, along with suggestions for managing them. It is important to remember that not every person experiences each of the signs and symptoms. Having one or more of these symptoms does not always indicate that your loved one is close to death. A member of your loved one's health care team can give you information about what to expect.  Drowsiness, increased sleep, or unresponsiveness. This is caused by changes in the person's metabolism.  Confusion about time, place, or identity of family and friends; restlessness; visions of people and places that are not present; pulling at bed linens or clothing (caused in part by changes in your loved ones metabolism).  Withdrawal and decreased socialization. This may be caused by decreased oxygen to the brain, decreased blood flow, and mental preparation for dying.  Decreased need for food and fluids, and loss of appetite. This is caused by the body's need to conserve energy and its decreasing ability to use food and fluids properly.  Loss of bladder or bowel control. This is caused by the relaxing of muscles in the pelvic area. You can talk to your loved one's  health care team about the possibility of inserting a catheter. A member of the health care team can teach you how to take care of the catheter, if one is needed.  Skin becomes cool to the  touch, particularly the hands and feet. Skin may become bluish in color, especially on the underside of the body. This is caused by decreased circulation to the extremities.  Rattling or gurgling sounds while breathing that may be loud; breathing that is irregular and shallow; decreased number of breaths per minute; breathing that alternates between rapid and slow. This is caused by congestion from fluid, a buildup of waste products in the body, and a decrease in circulation to the organs.  Turning the head toward a light source. This is caused by decreasing vision. Leave soft, indirect lights on in the room.  Increased difficulty controlling pain. This is caused by progression of the disease. It is important to provide pain medicines as your loved one's health care provider has prescribed.  Involuntary movements, changes in heart rate, and loss of reflexes in the legs and arms are also signs that the end of life is near.  What are the signs that the person has died?  There is no breathing or pulse.  The eyes do not move or blink, and the pupils are enlarged (dilated) and do not change with light. The eyelids may be slightly open.  The jaw is relaxed, and the mouth is slightly open.  The body releases the bowel and bladder contents.  The person does not respond to being touched or spoken to. What should happen after the person has died? After the person has passed away, there is no need to hurry with arrangements. Family members and close friends may wish to sit with the person, talk, or pray. When the family is ready, the following steps can be taken: 1. Place your loved one on his or her back with one pillow under the head. If necessary, you may wish to put your loved ones dentures or other artificial  parts in place. 2. If your loved one is in a hospice program, follow the guidelines provided by the program. You can request a hospice nurse to verify the person's death. 3. Contact the appropriate authorities in accordance with local regulations. If your loved one has requested not to be resuscitated through a Do Not Resuscitate (DNR) order or other mechanism, do not call 911. 4. Contact your loved one's health care provider and funeral home. 5. Contact other family members, friends, and clergy who may not be aware of your loved one's passing. 6. Obtain emotional support to cope with the loss of your loved one.  Questions to ask about death and dying  How long is the person expected to live? Where to find more information:  [facility] and Palliative Care Organization: http://www.brown-buchanan.com/  National Institute on Aging: http://kim-miller.com/ This information is not intended to replace advice given to you by your health care provider. Make sure you discuss any questions you have with your health care provider. Document Released: 04/09/2008 Document Revised: 12/07/2015 Document Reviewed: 12/20/2012 Elsevier Interactive Patient Education  2017 [place] is a service that is designed to provide people who are terminally ill and their families with medical, spiritual, and psychological support. Its aim is to improve your quality of life by keeping you as alert and comfortable as possible. Who will be my providers when I begin hospice care? Hospice teams often include:  A nurse.  A doctor. The hospice doctor will be available for your care, but you can bring your regular doctor or nurse practitioner.  Social workers.  Religious leaders (such as a Clinical biochemist).  Trained volunteers.  What roles will  providers play in my care? Hospice is performed by a team of health care professionals and volunteers who:  Help keep you comfortable: ? Hospice can be provided in  your home or in a homelike setting. ? The hospice staff works with your family and friends to help meet your needs. ? You will enjoy the support of loved ones by receiving much of your basic care from family and friends.  Provide pain relief and manage your symptoms. The staff supply all necessary medicines and equipment.  Provide companionship when you are alone.  Allow you and your family to rest. They may do light housekeeping, prepare meals, and run errands.  Provide counseling. They will make sure your emotional, spiritual, and social needs and those of your family are being met.  Provide spiritual care: ? Spiritual care will be individualized to meet your needs and your family's needs. ? Spiritual care may involve:  Helping you look at what death means to you.  Helping you say goodbye to your family and friends.  Performing a specific religious ceremony or ritual.  When should hospice care begin? Most people who use hospice are believed to have fewer than 6 months to live.  Your family and health care providers can help you decide when hospice services should begin.  If your condition improves, you may discontinue the program.  What should I consider before selecting a program? Most hospice programs are run by nonprofit, independent organizations. Some are affiliated with hospitals, nursing homes, or home health care agencies. Hospice programs can take place in the home or at a hospice center, hospital, or skilled nursing facility. When choosing a hospice program, ask the following questions:  What services are available to me?  What services will be offered to my loved ones?  How involved will my loved ones be?  How involved will my health care provider be?  Who makes up the hospice care team? How are they trained or screened?  How will my pain and symptoms be managed?  If my circumstances change, can the services be provided in a different setting, such as my home  or in the hospital?  Is the program reviewed and licensed by the state or certified in some other way?  Where can I learn more about hospice? You can learn about existing hospice programs in your area from your health care providers. You can also read more about hospice online. The websites of the following organizations contain helpful information:  The [facility] and Palliative Care Organization [facility]).  The Hospice Association of America ([place]).  The [place].  The American Cancer Society (ACS).  Hospice Net.  This information is not intended to replace advice given to you by your health care provider. Make sure you discuss any questions you have with your health care provider. Document Released: 10/18/2003 Document Revised: 02/15/2016 Document Reviewed: 05/11/2013 Elsevier Interactive Patient Education  2017 [place] What is end-of-life care? End-of-life care is the physical, emotional, mental, and spiritual care you receive during the days, weeks, or months while you are dying. Your end-of-life care team may include:  Health care providers.  A Education officer, museum.  A spiritual adviser.  The goal of end-of-life care is to give you the highest quality of life possible at the end of your life. What are the different types of end-of-life care? There are several different kinds of care options. Palliative care This type of care does not treat or cure a disease. The  goal is to manage your symptoms. These may include:  Pain.  Constipation.  Nausea.  Palliative care teams often also include support for family members and loved ones. You might need palliative care for months or years. Hospice care This is a kind of palliative care ordered and provided by your health care providers. A hospice care team can also help and support your loved ones. Comfort care This type of care is for meeting your basic needs and maintaining your overall  comfort at the end of your life. This includes caring for your:  Skin.  Breathing.  Nutrition.  Rest.  Temperature.  A plan for comfort care can also address the mental, emotional, and spiritual issues that may come up at the end of your life. Where does end-of-life care take place? End-of-life care can take place wherever you are living, as long as you get the care you need. End-of-life care can happen:  At your home.  In a nursing home.  In a hospital.  In a critical care unit.  You and your loved ones might be able to decide where end-of-life care takes place. This decision depends on:  Your comfort.  Your wishes.  The medical equipment you need.  How do I know when it is time for end-of-life care? Your health care provider might tell you that there are no more treatments left to try or that treatment can no longer control your illness. Or, you may decide that you do not want to undergo the treatments that are available. Talk to your health care provider and your loved ones about your end-of-life care options. If possible, have this conversation before you need this type of care. Discuss:  How much medical treatment you want during end-of-life care.  Where you would like to live while you are dying.  What kinds of treatments you would accept to keep you comfortable.  Which treatments you would refuse.  Your faith or spiritual needs at the end of your life.  Who will handle practical details, such as wills and finances.  You can create legal documents (advance directives) to let your loved ones know your wishes for end-of-life care. Talk to your health care provider or a lawyer about making a living will that explains your medical wishes. You can also have a medical power of attorney. This designates a person to make health decisions for you if you cannot make them yourself. This information is not intended to replace advice given to you by your health care provider.  Make sure you discuss any questions you have with your health care provider. Document Released: 01/26/2014 Document Revised: 06/14/2016 Document Reviewed: 09/24/2013 Elsevier Interactive Patient Education  2017 [employer].

## 2017-11-11 NOTE — Progress Notes (Signed)
Palliative: Mrs. Saltos is sitting in bed.  Her eyes are open, and she is looking out the window.  She is able to make her basic needs known.  Present today at bedside his daughter, Weber Cooks.  No questions or concerns noted at this time.  Advised to transport to residential hospice home in [place] has been arranged for approximately 1230 today.  Mrs. Eaddy ask for some coke.  She is able to take a few sips without overt signs and symptoms of aspiration.  She declines food.   All questions answered, emotional support provided. Conference with nursing staff related to plan of care/disposition. 20 minutes  Lillia Carmel, NP Palliative Medicine Team Team Phone # [phone]

## 2017-11-11 NOTE — Progress Notes (Signed)
[name] discharged to [facility] per MD order.    Allergies as of 11/11/2017      Reactions   Nsaids Other (See Comments)   Vomited blood   Oxycodone Hcl Hives, Rash   Povidone-iodine Rash   Prednisone Nausea Only, Other (See Comments)   FLU SYMPTOMS   Amoxicillin-pot Clavulanate Other (See Comments)   VOMITING   Betadine [povidone Iodine] Rash   rash   Doxazosin Mesylate Other (See Comments)   INCONTINENCE   Cephalexin Rash   Cymbalta [duloxetine Hcl] Nausea Only, Other (See Comments)   Headache   Gabapentin Rash   Neomycin Rash   Tape Other (See Comments)   Redness      Medication List    STOP taking these medications   acetaminophen 650 MG CR tablet Commonly known as:  TYLENOL   aspirin 81 MG chewable tablet   atorvastatin 40 MG tablet Commonly known as:  LIPITOR   clopidogrel 75 MG tablet Commonly known as:  PLAVIX   diclofenac sodium 1 % Gel Commonly known as:  VOLTAREN   docusate sodium 100 MG capsule Commonly known as:  COLACE   metoprolol tartrate 25 MG tablet Commonly known as:  LOPRESSOR   misoprostol 100 MCG tablet Commonly known as:  CYTOTEC   nitroGLYCERIN 0.4 MG SL tablet Commonly known as:  NITROSTAT   polyethylene glycol packet Commonly known as:  MIRALAX / GLYCOLAX   potassium chloride 10 MEQ tablet Commonly known as:  K-DUR,KLOR-CON   sertraline 100 MG tablet Commonly known as:  ZOLOFT   SYNTHROID 100 MCG tablet Generic drug:  levothyroxine   vitamin B-12 1000 MCG tablet Commonly known as:  CYANOCOBALAMIN   Vitamin D 2000 units Caps       IV site discontinued and catheter remains intact. Site without signs and symptoms of complications. Dressing and pressure applied.  Patient transported to Hospice via [facility], no distress noted upon discharge.  [name] [name] 11/11/2017 12:37 PM

## 2017-12-13 DEATH — deceased
